# Patient Record
Sex: Male | Born: 1955 | Race: White | Hispanic: No | Smoking: Former smoker
Health system: Southern US, Community
[De-identification: ages and names within clinical notes are randomized; demographics above are authoritative.]

## PROBLEM LIST (undated history)

## (undated) DIAGNOSIS — F17210 Nicotine dependence, cigarettes, uncomplicated: Secondary | ICD-10-CM

## (undated) DIAGNOSIS — R0600 Dyspnea, unspecified: Secondary | ICD-10-CM

## (undated) DIAGNOSIS — E7800 Pure hypercholesterolemia, unspecified: Secondary | ICD-10-CM

## (undated) DIAGNOSIS — K219 Gastro-esophageal reflux disease without esophagitis: Secondary | ICD-10-CM

## (undated) DIAGNOSIS — G629 Polyneuropathy, unspecified: Secondary | ICD-10-CM

## (undated) DIAGNOSIS — J449 Chronic obstructive pulmonary disease, unspecified: Secondary | ICD-10-CM

## (undated) DIAGNOSIS — M1990 Unspecified osteoarthritis, unspecified site: Secondary | ICD-10-CM

## (undated) DIAGNOSIS — L723 Sebaceous cyst: Secondary | ICD-10-CM

## (undated) DIAGNOSIS — I10 Essential (primary) hypertension: Secondary | ICD-10-CM

## (undated) DIAGNOSIS — C801 Malignant (primary) neoplasm, unspecified: Secondary | ICD-10-CM

## (undated) DIAGNOSIS — B079 Viral wart, unspecified: Secondary | ICD-10-CM

## (undated) HISTORY — PX: BACK SURGERY: SHX140

## (undated) HISTORY — PX: OTHER SURGICAL HISTORY: SHX169

## (undated) HISTORY — PX: COLONOSCOPY: SHX174

## (undated) HISTORY — DX: Malignant (primary) neoplasm, unspecified: C80.1

## (undated) HISTORY — PX: KNEE ARTHROSCOPY: SUR90

## (undated) HISTORY — DX: Essential (primary) hypertension: I10

## (undated) HISTORY — PX: LEG SKIN LESION  BIOPSY / EXCISION: SUR473

## (undated) HISTORY — PX: POLYPECTOMY: SHX149

---

## 1978-09-29 HISTORY — PX: OTHER SURGICAL HISTORY: SHX169

## 1998-01-16 ENCOUNTER — Other Ambulatory Visit: Admission: RE | Admit: 1998-01-16 | Discharge: 1998-01-16 | Payer: Self-pay | Admitting: *Deleted

## 1998-07-12 ENCOUNTER — Ambulatory Visit (HOSPITAL_BASED_OUTPATIENT_CLINIC_OR_DEPARTMENT_OTHER): Admission: RE | Admit: 1998-07-12 | Discharge: 1998-07-12 | Payer: Self-pay | Admitting: *Deleted

## 2000-09-29 HISTORY — PX: OTHER SURGICAL HISTORY: SHX169

## 2001-08-10 ENCOUNTER — Ambulatory Visit (HOSPITAL_BASED_OUTPATIENT_CLINIC_OR_DEPARTMENT_OTHER): Admission: RE | Admit: 2001-08-10 | Discharge: 2001-08-11 | Payer: Self-pay | Admitting: *Deleted

## 2002-02-23 ENCOUNTER — Encounter: Payer: Self-pay | Admitting: Orthopedic Surgery

## 2002-02-23 ENCOUNTER — Encounter: Admission: RE | Admit: 2002-02-23 | Discharge: 2002-02-23 | Payer: Self-pay | Admitting: Orthopedic Surgery

## 2002-08-22 ENCOUNTER — Encounter: Payer: Self-pay | Admitting: Family Medicine

## 2002-08-22 ENCOUNTER — Encounter: Admission: RE | Admit: 2002-08-22 | Discharge: 2002-08-22 | Payer: Self-pay | Admitting: Family Medicine

## 2005-02-04 ENCOUNTER — Ambulatory Visit: Payer: Self-pay | Admitting: Gastroenterology

## 2005-02-14 ENCOUNTER — Encounter (INDEPENDENT_AMBULATORY_CARE_PROVIDER_SITE_OTHER): Payer: Self-pay | Admitting: Specialist

## 2005-02-14 ENCOUNTER — Ambulatory Visit: Payer: Self-pay | Admitting: Gastroenterology

## 2005-11-11 ENCOUNTER — Encounter: Admission: RE | Admit: 2005-11-11 | Discharge: 2005-11-11 | Payer: Self-pay | Admitting: Family Medicine

## 2005-12-17 ENCOUNTER — Encounter: Admission: RE | Admit: 2005-12-17 | Discharge: 2005-12-17 | Payer: Self-pay | Admitting: Family Medicine

## 2005-12-19 ENCOUNTER — Encounter: Admission: RE | Admit: 2005-12-19 | Discharge: 2005-12-19 | Payer: Self-pay | Admitting: Family Medicine

## 2005-12-26 ENCOUNTER — Encounter: Admission: RE | Admit: 2005-12-26 | Discharge: 2005-12-26 | Payer: Self-pay | Admitting: Family Medicine

## 2006-01-02 ENCOUNTER — Encounter: Admission: RE | Admit: 2006-01-02 | Discharge: 2006-01-02 | Payer: Self-pay | Admitting: Family Medicine

## 2006-01-16 ENCOUNTER — Encounter: Admission: RE | Admit: 2006-01-16 | Discharge: 2006-01-16 | Payer: Self-pay | Admitting: Family Medicine

## 2006-02-18 ENCOUNTER — Ambulatory Visit (HOSPITAL_COMMUNITY): Admission: RE | Admit: 2006-02-18 | Discharge: 2006-02-19 | Payer: Self-pay | Admitting: Specialist

## 2006-09-29 HISTORY — PX: OTHER SURGICAL HISTORY: SHX169

## 2007-02-01 ENCOUNTER — Ambulatory Visit: Payer: Self-pay | Admitting: Gastroenterology

## 2007-02-19 ENCOUNTER — Ambulatory Visit: Payer: Self-pay | Admitting: Gastroenterology

## 2007-02-19 ENCOUNTER — Encounter: Payer: Self-pay | Admitting: Gastroenterology

## 2007-05-14 ENCOUNTER — Encounter: Admission: RE | Admit: 2007-05-14 | Discharge: 2007-05-14 | Payer: Self-pay | Admitting: Otolaryngology

## 2007-07-30 ENCOUNTER — Ambulatory Visit: Payer: Self-pay | Admitting: Gastroenterology

## 2007-08-11 ENCOUNTER — Ambulatory Visit: Payer: Self-pay | Admitting: Gastroenterology

## 2007-08-11 ENCOUNTER — Encounter: Payer: Self-pay | Admitting: Gastroenterology

## 2008-03-24 ENCOUNTER — Encounter: Admission: RE | Admit: 2008-03-24 | Discharge: 2008-03-24 | Payer: Self-pay | Admitting: Family Medicine

## 2008-04-07 ENCOUNTER — Encounter: Admission: RE | Admit: 2008-04-07 | Discharge: 2008-04-07 | Payer: Self-pay | Admitting: Family Medicine

## 2008-07-12 ENCOUNTER — Encounter: Admission: RE | Admit: 2008-07-12 | Discharge: 2008-07-12 | Payer: Self-pay | Admitting: Family Medicine

## 2009-01-17 ENCOUNTER — Encounter: Admission: RE | Admit: 2009-01-17 | Discharge: 2009-01-17 | Payer: Self-pay | Admitting: Family Medicine

## 2009-04-10 ENCOUNTER — Ambulatory Visit (HOSPITAL_COMMUNITY): Admission: RE | Admit: 2009-04-10 | Discharge: 2009-04-10 | Payer: Self-pay | Admitting: Family Medicine

## 2009-04-11 ENCOUNTER — Encounter (HOSPITAL_COMMUNITY): Admission: RE | Admit: 2009-04-11 | Discharge: 2009-05-31 | Payer: Self-pay | Admitting: Family Medicine

## 2009-07-05 ENCOUNTER — Encounter (INDEPENDENT_AMBULATORY_CARE_PROVIDER_SITE_OTHER): Payer: Self-pay | Admitting: *Deleted

## 2010-03-12 ENCOUNTER — Telehealth: Payer: Self-pay | Admitting: Gastroenterology

## 2010-03-26 ENCOUNTER — Encounter: Admission: RE | Admit: 2010-03-26 | Discharge: 2010-03-26 | Payer: Self-pay | Admitting: Family Medicine

## 2010-05-10 ENCOUNTER — Encounter (INDEPENDENT_AMBULATORY_CARE_PROVIDER_SITE_OTHER): Payer: Self-pay | Admitting: *Deleted

## 2010-05-14 ENCOUNTER — Ambulatory Visit: Payer: Self-pay | Admitting: Gastroenterology

## 2010-05-31 ENCOUNTER — Ambulatory Visit: Payer: Self-pay | Admitting: Gastroenterology

## 2010-06-05 ENCOUNTER — Encounter: Payer: Self-pay | Admitting: Gastroenterology

## 2010-09-29 HISTORY — PX: TOTAL HIP ARTHROPLASTY: SHX124

## 2010-10-29 NOTE — Letter (Signed)
Summary: [facility] Instructions  [facility]  [address] [place], [place] [address]   Phone: [phone]  Fax: [phone]       Mark Stevenson    06/18/1956    MRN: [id number]        Procedure Day Dorna Bloom:  [name]  05/31/10     Arrival Time:  8:00AM     Procedure Time:  9:00AM     Location of Procedure:                    _ X_  [facility] (4th Floor)                       PREPARATION FOR COLONOSCOPY WITH MOVIPREP   Starting 5 days prior to your procedure 05/26/10 do not eat nuts, seeds, popcorn, corn, beans, peas,  salads, or any raw vegetables.  Do not take any fiber supplements (e.g. Metamucil, Citrucel, and Benefiber).  THE DAY BEFORE YOUR PROCEDURE         DATE: 05/30/10  DAY: THURSDAY  1.  Drink clear liquids the entire day-NO SOLID FOOD  2.  Do not drink anything colored red or purple.  Avoid juices with pulp.  No orange juice.  3.  Drink at least 64 oz. (8 glasses) of fluid/clear liquids during the day to prevent dehydration and help the prep work efficiently.  CLEAR LIQUIDS INCLUDE: Water Jello Ice Popsicles Tea (sugar ok, no milk/cream) Powdered fruit flavored drinks Coffee (sugar ok, no milk/cream) Gatorade Juice: apple, white grape, white cranberry  Lemonade Clear bullion, consomm, broth Carbonated beverages (any kind) Strained chicken noodle soup Hard Candy                             4.  In the morning, mix first dose of MoviPrep solution:    Empty 1 Pouch A and 1 Pouch B into the disposable container    Add lukewarm drinking water to the top line of the container. Mix to dissolve    Refrigerate (mixed solution should be used within 24 hrs)  5.  Begin drinking the prep at 5:00 p.m. The MoviPrep container is divided by 4 marks.   Every 15 minutes drink the solution down to the next mark (approximately 8 oz) until the full liter is complete.   6.  Follow completed prep with 16 oz of clear liquid of your choice (Nothing  red or purple).  Continue to drink clear liquids until bedtime.  7.  Before going to bed, mix second dose of MoviPrep solution:    Empty 1 Pouch A and 1 Pouch B into the disposable container    Add lukewarm drinking water to the top line of the container. Mix to dissolve    Refrigerate  THE DAY OF YOUR PROCEDURE      DATE: 05/31/10  DAY: FRIDAY  Beginning at 4:00AM (5 hours before procedure):         1. Every 15 minutes, drink the solution down to the next mark (approx 8 oz) until the full liter is complete.  2. Follow completed prep with 16 oz. of clear liquid of your choice.    3. You may drink clear liquids until 7:00AM (2 HOURS BEFORE PROCEDURE).   MEDICATION INSTRUCTIONS  Unless otherwise instructed, you should take regular prescription medications with a small sip of water   as early as possible the morning of  your procedure.  Additional medication instructions: HOLD Triamtere/ HCTZ before coming in the day of the procedure only (only hold 05-31-10)         OTHER INSTRUCTIONS  You will need a responsible adult at least 56 years of age to accompany you and drive you home.   This person must remain in the waiting room during your procedure.  Wear loose fitting clothing that is easily removed.  Leave jewelry and other valuables at home.  However, you may wish to bring a book to read or  an iPod/MP3 player to listen to music as you wait for your procedure to start.  Remove all body piercing jewelry and leave at home.  Total time from sign-in until discharge is approximately 2-3 hours.  You should go home directly after your procedure and rest.  You can resume normal activities the  day after your procedure.  The day of your procedure you should not:   Drive   Make legal decisions   Operate machinery   Drink alcohol   Return to work  You will receive specific instructions about eating, activities and medications before you leave.    The above instructions  have been reviewed and explained to me by  [name] RN  May 14, 2010 8:13 AM    I fully understand and can verbalize these instructions _____________________________ Date _________

## 2010-10-29 NOTE — Procedures (Signed)
Summary: Colonoscopy  Patient: [name] Note: All result statuses are Final unless otherwise noted.  Tests: (1) Colonoscopy (COL)   COL Colonoscopy           DONE     [facility]     [address]     [place], [place]  [address]           COLONOSCOPY PROCEDURE REPORT           PATIENT:  Mark Stevenson, Mark Stevenson  MR#:  [id number]     BIRTHDATE:  January 24, 1956, 54 yrs. old  GENDER:  male     ENDOSCOPIST:  Judie Petit T. Russella Dar, MD, [facility]           PROCEDURE DATE:  05/31/2010     PROCEDURE:  Colonoscopy with snare polypectomy     ASA CLASS:  Class II     INDICATIONS:  1) surveillance and high-risk screening  2)     follow-up of polyp, adenomatous polyps, 01/2005, 07/2007.     MEDICATIONS:   Fentanyl 75 mcg IV, Versed 8 mg IV     DESCRIPTION OF PROCEDURE:   After the risks benefits and     alternatives of the procedure were thoroughly explained, informed     consent was obtained.  Digital rectal exam was performed and     revealed no abnormalities.   The LB PCF-H180AL [id number] endoscope     was introduced through the anus and advanced to the cecum, which     was identified by both the appendix and ileocecal valve, without     limitations.  The quality of the prep was good, using MoviPrep.     The instrument was then slowly withdrawn as the colon was fully     examined.     <<PROCEDUREIMAGES>>     FINDINGS:  A sessile polyp was found in the ascending colon. It     was 7 mm in size. Polyp was snared, then cauterized with monopolar     cautery. Retrieval was successful. A sessile polyp was found in     the descending colon. It was 4 mm in size. Polyp was snared     without cautery. Retrieval was successful. A pedunculated polyp     was found in the sigmoid colon. It was 12 mm in size. Polyp was     snared, then cauterized with monopolar cautery. Retrieval was     successful. A sessile polyps were found in the rectum. It was 5 mm     in size. Polyp was snared, then cauterized with  monopolar cautery.     Retrieval was successful. Mild diverticulosis was found in the     sigmoid colon. This was otherwise a normal examination of the     colon. Retroflexed views in the rectum revealed internal     hemorrhoids, moderate.  The time to cecum =  2.5  minutes. The     scope was then withdrawn (time =  12.25  min) from the patient and     the procedure completed.           COMPLICATIONS:  None           ENDOSCOPIC IMPRESSION:     1) 7 mm sessile polyp in the ascending colon     2) 4 mm sessile polyp in the descending colon     3) 12 mm pedunculated polyp in the sigmoid colon     4) 5 mm sessile polyp in  the rectum     5) Mild diverticulosis in the sigmoid colon     6) Internal hemorrhoids           RECOMMENDATIONS:     1) No aspirin or NSAID's for 2 weeks     2) Await pathology results     3) Repeat Colonoscopy in 3 years pending pathology review           Malcolm T. Russella Dar, MD, [facility]           CC: Melba Coon MD           n.     Rosalie DoctorVenita Lick. Stark at 05/31/2010 09:38 AM           Albin Fischer, [id number]  Note: An exclamation mark (!) indicates a result that was not dispersed into the flowsheet. Document Creation Date: 05/31/2010 9:38 AM _______________________________________________________________________  (1) Order result status: Final Collection or observation date-time: 05/31/2010 09:26 Requested date-time:  Receipt date-time:  Reported date-time:  Referring Physician:   Ordering Physician: Claudette Head [phone]) Specimen Source:  Source: Launa Grill Order Number: [id number] Lab site:   Appended Document: Colonoscopy     Procedures Next Due Date:    Colonoscopy: 05/2013

## 2010-10-29 NOTE — Progress Notes (Signed)
Summary: Schedule Colonoscopy   Phone Note Outgoing Call Call back at Home Phone [phone]   Call placed by: Harlow Mares CMA Duncan Dull),  March 12, 2010 10:15 AM Call placed to: Patient Summary of Call: Left message on patients machine to call back. patient is due for colonoscopy. Initial call taken by: Harlow Mares CMA Duncan Dull),  March 12, 2010 10:16 AM  Follow-up for Phone Call        Left a message on the patient machine to call back and schedule a previsit and procedure with our office. A letter will be mailed to the patient.   Follow-up by: Harlow Mares CMA Duncan Dull),  March 28, 2010 11:38 AM

## 2010-10-29 NOTE — Miscellaneous (Signed)
Summary: [facility] previsit  Clinical Lists Changes  Medications: Added new medication of MOVIPREP 100 GM  SOLR (PEG-KCL-NACL-NASULF-NA ASC-C) As per prep instructions. - Signed Rx of MOVIPREP 100 GM  SOLR (PEG-KCL-NACL-NASULF-NA ASC-C) As per prep instructions.;  #1 x 0;  Signed;  Entered by: [name] RN;  Authorized by: Meryl Dare MD [facility];  Method used: Electronically to [facility]  [address] [phone]*, [address] [place], [place]  [address], Ph: [phone] or [phone], Fax: [phone] Observations: Added new observation of NKA: T (05/14/2010 7:53)    Prescriptions: MOVIPREP 100 GM  SOLR (PEG-KCL-NACL-NASULF-NA ASC-C) As per prep instructions.  #1 x 0   Entered by:   [name] RN   Authorized by:   Meryl Dare MD [facility]   Signed by:   [name] RN on 05/14/2010   Method used:   Electronically to        [facility]  [address] [phone] (retail)       [address]       [place], [place]  [address]       Ph: [phone] or [phone]       Fax: [phone]   RxID:   [id number]

## 2010-10-29 NOTE — Letter (Signed)
Summary: Patient Notice-Hyperplastic Polyps  [facility]  [address] [place], [place] [address]   Phone: [phone]  Fax: [phone]        June 05, 2010 MRN: [id number]    [facility] [address] [place], [place]  [address]    Dear Mr. Gunnerson,  I am pleased to inform you that the colon polyp(s) removed during your recent colonoscopy was (were) found to be hyperplastic. These types of polyps are NOT pre-cancerous.  It is my recommendation that you have a repeat colonoscopy examination in 3 years.  Should you develop new or worsening symptoms of abdominal pain, bowel habit changes or bleeding from the rectum or bowels, please schedule an evaluation with either your primary care physician or with me.  Continue treatment plan as outlined the day of your exam.  Please call us if you are having persistent problems or have questions about your condition that have not been fully answered at this time.  Sincerely,  Meryl Dare MD [facility]  This letter has been electronically signed by your physician.  Appended Document: Patient Notice-Hyperplastic Polyps letter mailed

## 2011-02-14 NOTE — Op Note (Signed)
[facility]  Patient:    Mark Stevenson, Mark Stevenson Visit Number: [id number] MRN: [id number]          Service Type: DSU Location: [facility] Attending Physician:  Maryanna Shape Dictated by:   Reynolds Bowl, M.D. Proc. Date: 08/10/01 Admit Date:  08/10/2001                             Operative Report  PREOPERATIVE DIAGNOSIS:  Comminuted vertical split fracture, right patella.  POSTOPERATIVE DIAGNOSIS:  Comminuted vertical split fracture, right patella.  OPERATION PERFORMED:  Open reduction, internal fixation, followed by an arthroscopy and removal of loose chondral bodies.  SURGEON:  Reynolds Bowl, M.D.  ANESTHESIA:  General.  DESCRIPTION OF PROCEDURE:  The patient was given a general anesthetic, 1 g of Ancef IV. The proximal pneumatic tourniquet was put in place and isolated with the U-drape.  He was then draped in the usual manner and this included the use of an [place].  A vertical incision was made and carried down to the prepatella bursal level, and the overlying soft tissues pushed aside subperiosteally, sufficiently to expose the fracture area.  Clots and debris were removed.  There were multiple very small particles of chondral fragments with bits of cancellus bone attached, and others with none attached.  These were removed.  At that point I was able to, with the suction and with pickups, remove several other loose chondral bodies that were immediately under the fracture area.  At this point then I could reduce the fracture anatomically anteriorly, and elected to use two transverse 3.5 lag screws.  I used a guide wire and then drilled, and then tapped, then the lag screw.  The most distal screw held the fracture exactly reduced and looked very good.  The more proximal screw was a little superficial and was exposed by 4.5 threads, but it held very well and provided compression, and I elected not to change it.  But I did want these screws  to be on the more superficial or tension side.  I then was able to flex and extend the knee fully, and the fracture looked good.  There was smooth motion.  At this point, because I had seen so many small chondral fragments, I elected to do an arthroscopy, and with arthroscopy there were several other chondral fragments removed with the rotary meniscotome.  I could see that the femoral trochlea was abraded, and that the undersurface of the patella had gross defects that were not all vertical.  There were some horizontal ones, and that perhaps is where some of the other loose bodies came from.  After looking around the joint and removing all the apparent loose bodies, the instruments were removed.  I then closed the knee in layers using #2-0 and #3-0 Vicryl.  Having done this, the knee would flex and extend well and hold. Then the skin edges held in apposition with the metal staples.  A bulky dressing was applied, followed by an ACE wrap, followed by a knee immobilizer.  At this point the patient will be kept in the hospital overnight for pain control.  Do frequent quad sets and ankle pumps.  He is to be ambulatory, toe touching only, mostly at house rest.  Tylox or Vicodin for pain.  He will see me in the office in one week.  I would hope to begin some gentle motion in approximately one week.  Dictated by:   Reynolds Bowl, M.D. Attending Physician:  Maryanna Shape DD:  08/10/01 TD:  08/10/01 Job: [id number]

## 2011-02-14 NOTE — Op Note (Signed)
NAMEQUINLAN, VOLLMER             ACCOUNT NO.:  [id number]   MEDICAL RECORD NO.:  [id number]          PATIENT TYPE:  AMB   LOCATION:  DAY                          FACILITY:  [facility]   PHYSICIAN:  Jene Every, M.D.    DATE OF BIRTH:  [birth year]   DATE OF PROCEDURE:  02/18/2006  DATE OF DISCHARGE:                                 OPERATIVE REPORT   PREOPERATIVE DIAGNOSIS:  Spinal stenosis, herniated nucleus pulposus at L3-  4.   POSTOPERATIVE DIAGNOSIS:  Spinal stenosis, herniated nucleus pulposus at L3-  4.   PROCEDURE PERFORMED:  Central decompression at L3-4 with microdiskectomy, L3-  4, left, foraminotomy of L4, lateral recess decompression.   ANESTHESIA:  General.   SURGEON:  Jene Every, M.D.   ASSISTANT:  Georges Lynch. Gioffre, M.D.   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  Operative intervention was indicated for decompression, particularly  on the left at L3-4.  We discussed the possibility of bilateral  hemilaminotomies versus central decompression following the intraoperative  findings.  The risks and benefits have been discussed, including bleeding,  infection, damage to vascular structures, CSF leakage, epidural fibrosis,  adjacent segment disease, need for fusion in the future, anesthetic  complications, etc.   TECHNIQUE:  With the patient in the supine position after the induction of  adequate general anesthesia and 2 gm of Kefzol, he was placed prone on the  [place] frame.  All bony prominences were well padded.  The lumbar region  prepped and draped in the usual sterile fashion using the 9090 position.  Two 18 gauge spinal needles were utilized to localize the 3-4 interspace,  confirmed with x-rays.   An incision was made through the spinous process  from 3-4.  Subcutaneous tissue was dissected.  Electrocautery was utilized  to achieve hemostasis.  The dorsal lumbar fascia was identified and divided  along the line of the skin incision.  The paraspinous muscle was elevated  from the lamina of 3-4.  A McCullough retractor was placed, a Penfield 4 in  the interlaminar space.  This was confirmed with x-ray at 3-4.  There was a  very small interlaminar space at 3-4 on the left ligamentum flavum.  Due to  the facet hypertrophy, it was felt that central decompression would be safer  in terms of the decompression, noted severe spinal stenosis centrally;  therefore, Leksell rongeur was utilized to perform a partial removal of the  spinous process of 3-4 in the interspinous ligament.  Continuing laminotomy  of the caudad edge of 3 was performed with a 2 mm Kerrison, extending it  cephalad to detach the ligamentum flavum from its cephalad attachment.  In a  similar fashion, the ligamentum flavum was attached from the cephalad edge  of 4 utilizing a 2 mm Kerrison, performing foraminotomy of 4.  It is  severely tight in the lateral recess of 3-4 on the left.  Under my operating  microscope, we gently mobilized the thecal sac medially after the central  ligamentum flavum removal, and completed the foraminotomy and the lateral  recess decompression at the medial border of the pedicle at 3-4 on the left.  Epidural venous plexus was noted, and bipolar electrocautery was utilized to  achieve hemostasis.  Central disk protrusion was noted.  Annulotomy was  performed.  A copious portion of disk material was removed from the disk  space with a straight and up-biting micropituitary.  From that, mobilzing  with an Epstein, multiple fragments were removed.  A thorough diskectomy of  herniated material was performed.  The disk space was copiously irrigated  with antibiotic irrigation.  A hockey stick probe was  placed in the foramen  of 3-4, found to be widely patent beneath the thecal sac and also onto the  right side as well, passed easily into the 3-4 foramen.  No disk herniation  noted on the right.  The wound copiously irrigated.  Inspection revealed no  evidence of CSF leaks or active bleeding.  We placed bone wax on the  cancellous surfaces, and thrombin-soaked Gelfoam in the interlaminar space.  Removed the [facility] retractor.  Paraspinous muscles inspected.  Placed  bone wax on the spinous processes.  Irrigated.  Placed a Hemovac and brought  it out through a lateral stab wound in the skin.  Repaired the dorsal lumbar  fascia with #1 Vicryl interrupted figure-of-eight sutures.  Subcutaneous  tissue was reapproximated with 2-0 Vicryl simple sutures.  The skin was  reapproximated with staples.  The wound was dressed sterilely.  Placed  supine on the hospital bed.  Extubated without difficulty and transported to  the recovery room in satisfactory condition.   Patient tolerated the procedure well.  No complications.  Blood loss 100 cc.      Jene Every, M.D.  Electronically Signed     JB/MEDQ  D:  02/18/2006  T:  02/18/2006  Job:  [id number]

## 2011-02-18 ENCOUNTER — Encounter (HOSPITAL_BASED_OUTPATIENT_CLINIC_OR_DEPARTMENT_OTHER)
Admission: RE | Admit: 2011-02-18 | Discharge: 2011-02-18 | Disposition: A | Discharge status: Home / Self Care | Payer: 59 | Source: Ambulatory Visit | Attending: Orthopedic Surgery | Admitting: Orthopedic Surgery

## 2011-02-18 LAB — BASIC METABOLIC PANEL
GFR calc Af Amer: >60 mL/min (ref >60)
GFR calc non Af Amer: >60 mL/min (ref >60)
Sodium: 137 mEq/L (ref 135–145)

## 2011-02-19 ENCOUNTER — Ambulatory Visit (HOSPITAL_BASED_OUTPATIENT_CLINIC_OR_DEPARTMENT_OTHER)
Admission: RE | Admit: 2011-02-19 | Discharge: 2011-02-19 | Disposition: A | Discharge status: Home / Self Care | Payer: 59 | Source: Ambulatory Visit | Attending: Orthopedic Surgery | Admitting: Orthopedic Surgery

## 2011-02-19 DIAGNOSIS — Z01812 Encounter for preprocedural laboratory examination: Secondary | ICD-10-CM | POA: Insufficient documentation

## 2011-02-19 DIAGNOSIS — E669 Obesity, unspecified: Secondary | ICD-10-CM | POA: Insufficient documentation

## 2011-02-19 DIAGNOSIS — M6240 Contracture of muscle, unspecified site: Secondary | ICD-10-CM | POA: Insufficient documentation

## 2011-02-19 DIAGNOSIS — Z01810 Encounter for preprocedural cardiovascular examination: Secondary | ICD-10-CM | POA: Insufficient documentation

## 2011-02-19 DIAGNOSIS — J449 Chronic obstructive pulmonary disease, unspecified: Secondary | ICD-10-CM | POA: Insufficient documentation

## 2011-02-19 DIAGNOSIS — F17200 Nicotine dependence, unspecified, uncomplicated: Secondary | ICD-10-CM | POA: Insufficient documentation

## 2011-02-19 DIAGNOSIS — G5750 Tarsal tunnel syndrome, unspecified lower limb: Secondary | ICD-10-CM | POA: Insufficient documentation

## 2011-02-19 DIAGNOSIS — I10 Essential (primary) hypertension: Secondary | ICD-10-CM | POA: Insufficient documentation

## 2011-02-19 DIAGNOSIS — J4489 Other specified chronic obstructive pulmonary disease: Secondary | ICD-10-CM | POA: Insufficient documentation

## 2011-02-19 LAB — POCT HEMOGLOBIN-HEMACUE: Hemoglobin: 16.9 g/dL (ref 13.0–17.0)

## 2011-03-01 NOTE — Op Note (Signed)
Mark Stevenson, Mark Stevenson             ACCOUNT NO.:  [id number]  MEDICAL RECORD NO.:  [id number]          PATIENT TYPE:  LOCATION:                                 FACILITY:  PHYSICIAN:  Leonides Grills, M.D.          DATE OF BIRTH:  DATE OF PROCEDURE:  02/19/2011 DATE OF DISCHARGE:                              OPERATIVE REPORT   PREOPERATIVE DIAGNOSES: 1. Right tarsal tunnel syndrome. 2. Right tight gastroc.  POSTOPERATIVE DIAGNOSES: 1. Right tarsal tunnel syndrome. 2. Right tight gastroc.  OPERATIONS: 1. Right tarsal tunnel release. 2. Right gastroc slide.  ANESTHESIA:  General.  SURGEON:  Leonides Grills, MD  ASSISTANT:  Richardean Canal, PA-C  ESTIMATED BLOOD LOSS:  Minimal.  TOURNIQUET TIME:  Approximately 20 minutes.  COMPLICATIONS:  None.  DISPOSITION:  Stable to PR.  INDICATIONS:  This is a 55 year old gentleman who has had longstanding plantar fasciitis as well as burning plantar foot pain that was shown on EMG nerve conduction study that would be tarsal tunnel syndrome.  He was consented for the above procedure.  All risks of infection, neurovascular injury, persistent pain, worsening pain, prolonged recovery, wound healing problems, DVT, PE were all explained, questions were encouraged and answered.  OPERATIVE NOTE:  The patient was brought to the operating room and placed in supine position after adequate general anesthesia was administered as well as Ancef 1 gram IV piggyback.  Right lower extremity was prepped in sterile manner over proximally thigh tourniquet.  We started the procedure with a longitudinal incision on the medial aspect of the gastrocnemius muscle tendinous junction. Dissection was carried down through the skin.  Hemostasis was obtained. Fascia was opened in line with the incision.  Conjoined region was then developed between the gastroc and soleus.  Soft tissue was then elevated off the posterior aspect of the gastrocnemius.  Sural nerve  was identified and protected posteriorly throughout the case.  Gastrocnemius was then released with a curved Mayo scissors protecting soft tissues posteriorly including the sural nerve at all times.  This had outstanding release of tight gastroc.  The area was copiously irrigated with normal saline.  Subcu was closed with 3-0 Vicryl.  Skin was closed with 4-0 Monocryl subcuticular stitch.  Steri-Strips were applied.  The limb was then gravity exsanguinated and tourniquet was elevated to 290 mmHg.  A curvilinear incision midline over the posterior medial aspect of the right ankle.  Tarsal tunnel area was then made.  Dissection was carried down through the skin.  Hemostasis was obtained.  The flexor retinaculum over the tarsal tunnel was then carefully dissected out and then with a Therapist, nutritional protecting that the neurovascular structures deep to this, the flexor retinaculum was then released.  We also released the lateral portion of the abductor halluces fascia as well. This had an outstanding release.  The area was copiously irrigated with normal saline.  Tourniquet was deflated.  Hemostasis was obtained. There was no pulsatile bleeding.  Subcu was closed with 3-0 Vicryl, skin was closed with 4-0 nylon.  Sterile dressing was applied.  Cam Walker boot was applied.  The patient was stable  to PR.     Leonides Grills, M.D.     PB/MEDQ  D:  02/19/2011  T:  02/20/2011  Job:  [id number]  Electronically Signed by Leonides Grills M.D. on 03/01/2011 09:52:35 AM

## 2011-03-17 ENCOUNTER — Encounter (HOSPITAL_BASED_OUTPATIENT_CLINIC_OR_DEPARTMENT_OTHER)
Admission: RE | Admit: 2011-03-17 | Discharge: 2011-03-17 | Disposition: A | Discharge status: Home / Self Care | Payer: 59 | Source: Ambulatory Visit | Attending: Orthopedic Surgery | Admitting: Orthopedic Surgery

## 2011-03-17 LAB — BASIC METABOLIC PANEL
BUN: 18 mg/dL (ref 6–23)
CO2: 22 mEq/L (ref 19–32)
Chloride: 103 mEq/L (ref 96–112)
Creatinine, Ser: 0.82 mg/dL (ref 0.50–1.35)
GFR calc Af Amer: >60 mL/min (ref >60)
Glucose, Bld: 85 mg/dL (ref 70–99)
Sodium: 136 mEq/L (ref 135–145)

## 2011-03-19 ENCOUNTER — Ambulatory Visit (HOSPITAL_BASED_OUTPATIENT_CLINIC_OR_DEPARTMENT_OTHER)
Admission: RE | Admit: 2011-03-19 | Discharge: 2011-03-19 | Disposition: A | Discharge status: Home / Self Care | Payer: 59 | Source: Ambulatory Visit | Attending: Orthopedic Surgery | Admitting: Orthopedic Surgery

## 2011-03-19 DIAGNOSIS — M6240 Contracture of muscle, unspecified site: Secondary | ICD-10-CM | POA: Insufficient documentation

## 2011-03-19 DIAGNOSIS — Z01812 Encounter for preprocedural laboratory examination: Secondary | ICD-10-CM | POA: Insufficient documentation

## 2011-03-19 DIAGNOSIS — G5750 Tarsal tunnel syndrome, unspecified lower limb: Secondary | ICD-10-CM | POA: Insufficient documentation

## 2011-04-01 ENCOUNTER — Other Ambulatory Visit: Payer: Self-pay | Admitting: Orthopedic Surgery

## 2011-04-01 ENCOUNTER — Encounter (HOSPITAL_COMMUNITY): Payer: 59

## 2011-04-01 ENCOUNTER — Ambulatory Visit (HOSPITAL_COMMUNITY)
Admission: RE | Admit: 2011-04-01 | Discharge: 2011-04-01 | Disposition: A | Discharge status: Home / Self Care | Payer: 59 | Source: Ambulatory Visit | Attending: Orthopedic Surgery | Admitting: Orthopedic Surgery

## 2011-04-01 ENCOUNTER — Other Ambulatory Visit (HOSPITAL_COMMUNITY): Payer: Self-pay | Admitting: Orthopedic Surgery

## 2011-04-01 DIAGNOSIS — Z01812 Encounter for preprocedural laboratory examination: Secondary | ICD-10-CM | POA: Insufficient documentation

## 2011-04-01 DIAGNOSIS — M87059 Idiopathic aseptic necrosis of unspecified femur: Secondary | ICD-10-CM

## 2011-04-01 DIAGNOSIS — Z01818 Encounter for other preprocedural examination: Secondary | ICD-10-CM | POA: Insufficient documentation

## 2011-04-01 DIAGNOSIS — R059 Cough, unspecified: Secondary | ICD-10-CM | POA: Insufficient documentation

## 2011-04-01 DIAGNOSIS — R05 Cough: Secondary | ICD-10-CM | POA: Insufficient documentation

## 2011-04-01 DIAGNOSIS — M1710 Unilateral primary osteoarthritis, unspecified knee: Secondary | ICD-10-CM | POA: Insufficient documentation

## 2011-04-01 DIAGNOSIS — Z01811 Encounter for preprocedural respiratory examination: Secondary | ICD-10-CM | POA: Insufficient documentation

## 2011-04-01 DIAGNOSIS — I10 Essential (primary) hypertension: Secondary | ICD-10-CM | POA: Insufficient documentation

## 2011-04-01 LAB — BASIC METABOLIC PANEL
BUN: 22 mg/dL (ref 6–23)
CO2: 24 mEq/L (ref 19–32)
Calcium: 9.6 mg/dL (ref 8.4–10.5)
Chloride: 103 mEq/L (ref 96–112)
Creatinine, Ser: 0.97 mg/dL (ref 0.50–1.35)
Glucose, Bld: 106 mg/dL — ABNORMAL HIGH (ref 70–99)

## 2011-04-01 LAB — CBC
HCT: 41.6 % (ref 39.0–52.0)
MCH: 32.4 pg (ref 26.0–34.0)
MCHC: 35.3 g/dL (ref 30.0–36.0)
RDW: 12.6 % (ref 11.5–15.5)

## 2011-04-01 LAB — DIFFERENTIAL
Basophils Absolute: 0.1 10*3/uL (ref 0.0–0.1)
Basophils Relative: 0 % (ref 0–1)
Eosinophils Absolute: 0.3 10*3/uL (ref 0.0–0.7)
Monocytes Absolute: 1.2 10*3/uL — ABNORMAL HIGH (ref 0.1–1.0)
Neutro Abs: 7.7 10*3/uL (ref 1.7–7.7)

## 2011-04-01 LAB — URINE MICROSCOPIC-ADD ON

## 2011-04-01 LAB — APTT: aPTT: 30 seconds (ref 24–37)

## 2011-04-01 LAB — URINALYSIS, ROUTINE W REFLEX MICROSCOPIC
Bilirubin Urine: NEGATIVE
Glucose, UA: NEGATIVE mg/dL
Ketones, ur: NEGATIVE mg/dL
Protein, ur: NEGATIVE mg/dL
pH: 6 (ref 5.0–8.0)

## 2011-04-01 LAB — SURGICAL PCR SCREEN
MRSA, PCR: NEGATIVE
Staphylococcus aureus: NEGATIVE

## 2011-04-07 NOTE — H&P (Signed)
NAMERADLEY, BARTO NO.:  [id number]  MEDICAL RECORD NO.:  [id number]  LOCATION:                                 FACILITY:  PHYSICIAN:  Madlyn Frankel. Charlann Boxer, M.D.  DATE OF BIRTH:  1956-04-11  DATE OF ADMISSION: DATE OF DISCHARGE:                             HISTORY & PHYSICAL   DATE OF SURGERY:  April 08, 2011  ADMISSION DIAGNOSIS:  Avascular necrosis, left hip.  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  After discussion of treatments, benefits, risks, and options, the patient is now scheduled for anterior total hip arthroplasty of the left hip.  Note that he has had bilateral tarsal tunnel releases recently last one being the left one which was done on March 19, 2011.  We will just need to check that incision and pad carefully when we do the hip.  Note that he is a candidate for tranexamic and will receive that at surgery and is given his home prescriptions of aspirin, Robaxin, iron, MiraLax, and Colace in the office.  PAST MEDICAL HISTORY:  Drug allergies:  None.  Current medications: 1. Losartan 100 mg 1 daily. 2. Triamterene and hydrochlorothiazide 37.5/25 one daily. 3. Lovastatin 20 mg 1 daily. 4. Gabapentin 600 mg 1 nightly.  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  Previous surgeries include left and right knee arthroscopies, ORIF of fractured kneecap on the right, low back surgery for herniated disk, and bilateral tarsal tunnels.  FAMILY HISTORY:  Positive for cancer in his father.  SOCIAL HISTORY:  The patient is married.  He works at [employer].  He smokes 1-1/2 packs a day and drinks rarely.  REVIEW OF SYSTEMS:  CENTRAL NERVOUS SYSTEM:  Negative for headache, blurred vision, or dizziness.  PULMONARY:  Positive  for exertional shortness of breath, chronic bronchitis, and COPD.  Negative for sleep apnea.  CARDIOVASCULAR:  Negative for chest pain or palpitation.  GI: Negative for ulcers or hepatitis.  GU:  Negative for urinary tract difficulty.  MUSCULOSKELETAL:  Positive as in HPI.  PHYSICAL EXAMINATION:  VITAL SIGNS:  BP 120/78, respirations 16, and pulse 70 and regular. GENERAL APPEARANCE:  This is a well-developed, well-nourished gentleman in no acute distress. HEENT:  Head normocephalic.  Nose patent.  Ears parent.  Pupils are equal, round, and reactive to light.  Throat without injection. NECK:  Supple without adenopathy.  Carotids are 2+ without bruit. CHEST:  Clear to auscultation.  No rales or rhonchi.  Respirations 16. HEART:  Regular rate and rhythm at 78 beats per minute without murmur. ABDOMEN:  Soft.  Active bowel sounds.  No masses or organomegaly. NEUROLOGIC:  The patient is alert and oriented to time, place, and person.  Cranial nerves II-XII grossly intact. EXTREMITIES:  The left ankle in a Cam walker, status post tarsal tunnel release.  Neurovascular status is intact.  His left hip shows decreased range of motion with pain on rotation.  Sensation and circulation are intact.  IMPRESSION:  Left hip avascular necrosis.  PLAN:  Left total hip arthroplasty, anterior approach.     Jaquelyn Bitter. Chabon, P.A.   ______________________________ Madlyn Frankel Charlann Boxer, M.D.    SJC/MEDQ  D:  03/26/2011  T:  03/26/2011  Job:  [id number]  Electronically Signed by Jodene Nam P.A. on 04/06/2011 04:52:11 PM Electronically Signed by Durene Romans M.D. on 04/07/2011 09:15:45 AM

## 2011-04-08 ENCOUNTER — Inpatient Hospital Stay (HOSPITAL_COMMUNITY)
Admission: RE | Admit: 2011-04-08 | Discharge: 2011-04-10 | DRG: 470 | Disposition: A | Discharge status: Home Health | Payer: 59 | Source: Ambulatory Visit | Attending: Orthopedic Surgery | Admitting: Orthopedic Surgery

## 2011-04-08 ENCOUNTER — Inpatient Hospital Stay (HOSPITAL_COMMUNITY): Payer: 59

## 2011-04-08 DIAGNOSIS — M87059 Idiopathic aseptic necrosis of unspecified femur: Principal | ICD-10-CM | POA: Diagnosis present

## 2011-04-08 DIAGNOSIS — I10 Essential (primary) hypertension: Secondary | ICD-10-CM | POA: Diagnosis present

## 2011-04-08 LAB — TYPE AND SCREEN: Antibody Screen: NEGATIVE

## 2011-04-09 LAB — BASIC METABOLIC PANEL
BUN: 13 mg/dL (ref 6–23)
Calcium: 8.3 mg/dL — ABNORMAL LOW (ref 8.4–10.5)
GFR calc Af Amer: >60 mL/min (ref >60)
GFR calc non Af Amer: >60 mL/min (ref >60)
Potassium: 3.5 mEq/L (ref 3.5–5.1)
Sodium: 137 mEq/L (ref 135–145)

## 2011-04-09 LAB — CBC
MCH: 32.2 pg (ref 26.0–34.0)
MCHC: 34.5 g/dL (ref 30.0–36.0)
RDW: 12.7 % (ref 11.5–15.5)

## 2011-04-10 LAB — CBC
Hemoglobin: 11.3 g/dL — ABNORMAL LOW (ref 13.0–17.0)
MCH: 31.7 pg (ref 26.0–34.0)
MCHC: 33.8 g/dL (ref 30.0–36.0)
Platelets: 152 10*3/uL (ref 150–400)
RBC: 3.56 MIL/uL — ABNORMAL LOW (ref 4.22–5.81)

## 2011-04-10 LAB — BASIC METABOLIC PANEL
Calcium: 8.8 mg/dL (ref 8.4–10.5)
GFR calc Af Amer: >60 mL/min (ref >60)
GFR calc non Af Amer: >60 mL/min (ref >60)
Glucose, Bld: 103 mg/dL — ABNORMAL HIGH (ref 70–99)
Potassium: 3.2 mEq/L — ABNORMAL LOW (ref 3.5–5.1)
Sodium: 135 mEq/L (ref 135–145)

## 2011-04-14 NOTE — Op Note (Signed)
NAMEKERT, SHACKETT NO.:  [id number]  MEDICAL RECORD NO.:  [id number]  LOCATION:  0006                         FACILITY:  [facility]  PHYSICIAN:  Madlyn Frankel. Charlann Boxer, M.D.  DATE OF BIRTH:  Feb 23, 1956  DATE OF PROCEDURE:  04/08/2011 DATE OF DISCHARGE:                              OPERATIVE REPORT   PREOPERATIVE DIAGNOSIS:  Left hip avascular necrosis.  POSTOPERATIVE DIAGNOSIS:  Left hip avascular necrosis.  PROCEDURE:  Left total hip replacement utilizing DePuy component, size 52 Pinnacle shell, 36 +4 neutral AltrX liner, size 5 standard Tri-Lock stem with a 36 +5 Delta ceramic ball.  SURGEON:  Madlyn Frankel. Charlann Boxer, MD  ASSISTANT:  Leilani Able, PA-C  ANESTHESIA:  General.  BLOOD LOSS:  About 200 cc.  DRAINS:  One Hemovac.  SPECIMENS:  None.  COMPLICATIONS:  None.  INDICATIONS FOR PROCEDURE:  Mr. Rahimi is a 55 year old gentleman who presented to the office for evaluation of left hip pain.  Radiographs showed evidence of femoral head avascular necrosis with collapse.  Unfortunately, there were no conservative nonoperative ways to manage his symptoms based on the progression of avascular necrosis.  After reviewing this with him and discussing risks and benefits of hip replacement surgery, he wished to proceed in this fashion.  Risks of infection, DVT, component failure, need for revision surgery all discussed and reviewed.  Approaches were discussed including anterior hip approach.  Consent was obtained for a left anterior hip replacement.  PROCEDURE IN DETAIL:  The patient was brought to operative theater. Once adequate anesthesia, preoperative antibiotics, Ancef administered, the patient was positioned supine on the OSI Hana table.  His left hip area was pre-prepped and then fluoroscopy used to confirm landmarks and positioning.  The left hip area for the anterior approach was then prepped and draped in sterile fashion with shower curtain technique.  A  time-out was performed identifying the patient, planned procedure and extremity.  An incision was made 2 cm lateral and distal to the anterior-superior iliac spine, extended over the anterior aspect of the trochanter.  The fascia of the tensor fascia lata was identified.  The muscle belly was swept laterally and retractor placed superiorly.  Pericapsular fat and circumflex vessels were cauterized.  Retractors were placed in the inferior neck capsule and rectus was elevated off the anterior acetabulum.  Capsulotomy was now made over the superior neck extending down to the trochanteric fossa towards the lesser trochanter.  Tag sutures were placed and retractors were placed intracapsular.  At this point, traction was applied.  Under fluoroscopic imaging, the neck cut was identified using the Bovie.  A neck osteotomy was made using oscillating saw.  Femoral head was removed, found to have significant avascular change with subchondral collapse.  Traction was removed.  Retractors were placed and posterior and anterior capsular and labral tissue debrided.  I began reaming with a 46 reamer, reamed up to a 51 reamer with good bony bed preparation.  A final 52 Pinnacle cup was then impacted with a curve impactor under fluoroscopic imaging confirming the amount of abduction and approximate anteversion. A single cancellous screw was used to support the initial fixation, a hole eliminator placed and the final 36 +  4 neutral AltrX liner impacted based on the cup position and orientation.  At this point, the femur was rolled 80 degrees of external rotation releasing the inferior capsule.  It was then brought back to neutral and hook placed over the lateral vastus ridge.  The femur was then elevated manually and held in position with a hook. Leg was then adducted and extended and retractors placed medially and posteriorly.  Soft tissue releases and posterior capsule release of the posterior aspect  of the hip was carried out at this point.  Following exposure of this proximal hip, I opened up the proximal femur using a box osteotome, then used a starting broach to set orientation.  With the starting broach in place, we confirmed the position under orthogonal radiographic views confirming the placement.  I then began broaching up where the retractors were placed with a size 2 broach and then carried this up to a size 5.  Size 5 broach seemed to have a good metaphyseal fit without any torsional movement.  At this point, a standard neck was applied and a trial reduction was carried out with 36 +1.5 trial ball. Trial reduction indicated that though the stem felt very stable, there may have been a little bit room to increase the size, however, again there was no movement.  Leg lengths on AP pelvis views showed that I was perhaps just a little bit shorter on this side.  At this point, all trial components were removed.  The final 5 standard stem was chosen after I reassessed the proximal fit of it.  I did not feel that I could get up to a size 6.  The size 5 standard Tri-Lock stem was then opened and impacted and set a little bit lower than the trial component did.  Based on this and the trial reduction, I trialed with a 36 +5 ball.  I felt leg lengths at this point were stable and comparable using fluoroscopic imaging.  We chose a 36 +5 Delta ceramic ball, which helped the offset and length combined.  The final ball was impacted on a clean and dry trunnion.  The hip was reduced.  It had been irrigated throughout the case and again at this point.  I reapproximated the anterior capsule using #1 Vicryl.  I placed a medium Hemovac drain deep.  The fascia of the tensor fascia was then reapproximated over top of the drain and the tensor fascia lata muscle.  The remainder of the wound was closed with 2-0 Vicryl, running 4-0 Monocryl.  The hip was cleaned, dried and dressed sterilely  using Dermabond and Aquacel dressing.  Drain site dressed separately.  He was then brought to recovery room extubated in stable condition tolerating the procedure well.     Madlyn Frankel Charlann Boxer, M.D.     MDO/MEDQ  D:  04/08/2011  T:  04/08/2011  Job:  [id number]  Electronically Signed by Durene Romans M.D. on 04/14/2011 09:39:42 AM

## 2011-04-14 NOTE — Op Note (Addendum)
  NAMECHAY, MAZZONI NO.:  [id number]  MEDICAL RECORD NO.:  [id number]  LOCATION:  1S                           FACILITY:  [facility]  PHYSICIAN:  Leonides Grills, M.D.     DATE OF BIRTH:  24-May-1956  DATE OF PROCEDURE:  03/19/2011 DATE OF DISCHARGE:                              OPERATIVE REPORT   PREOPERATIVE DIAGNOSES: 1. Left tarsal tunnel syndrome. 2. Left tight gastroc.  POSTOPERATIVE DIAGNOSES: 1. Left tarsal tunnel syndrome. 2. Left tight gastroc.  OPERATION: 1. Left tarsal tunnel release. 2. Left gastroc slide.  ANESTHESIA:  General.  SURGEON:  Leonides Grills, MD  ASSISTANT:  Richardean Canal, PA-C  ESTIMATED BLOOD LOSS:  Minimal.  TOURNIQUET TIME:  None.  COMPLICATIONS:  None.  DISPOSITION:  Stable to PR.  INDICATIONS:  This is a 55 year old male who has had longstanding metatarsalgia and burden in the plantar aspect of his foot with a documented x-rays consistent with tarsal tunnel syndrome.  He has had the contralateral side done and has had improvement.  He has consented to the above procedure.  All risks of infection or vessel injury, persistent worse pain, prolonged recovery, wound healing problems, DVT, PE were all explained, questions were encouraged and answered.  PROCEDURE IN DETAIL:  The patient was brought to the operative room, placed in a supine position.  After adequate general anesthesia was administered as well as Ancef 1 g IV piggyback, left lower extremity prepped and draped in sterile manner over a proximally placed thigh tourniquet.  Longitudinal incision on medial aspect of gastrocnemius muscle tendinous junction was then made.  Dissection was carried down through skin.  Hemostasis was obtained.  Fascia was opened in line of the incision.  Conjoined region was then developed between the gastroc soleus.  Soft tissue was elevated off posterior aspect of gastrocnemius. Sural nerves identified protected posteriorly throughout  the case. Gastrocnemius then released with curved Mayo scissors and excellent release of tight gastroc.  The area was copiously irrigated with normal saline.  Subcu was closed with 3-0 Vicryl, skin was closed with 4-0 Monocryl subcuticular stitch.  Steri-Strips were applied.  Limb was then gravity exsanguinated.  Tourniquet was inflated to 290 mmHg. Curvilinear incision on the posterior aspect of the ankle was then made. Dissection was carried down through skin.  Hemostasis was obtained. Flexor retinaculum over the neurovascular bundle was then carefully dissected out and released.  The lateral portion of the abductor hallucis fascia was also released as well.  The area was copiously irrigated with normal saline.  Tourniquet was inflated.  Hemostasis was obtained.  There was no pulsatile bleeding.  The area was copiously irrigated with normal saline.  Subcu was closed with 3-0 Vicryl and skin was closed with 4-0 nylon.  Sterile dressing was applied.  Cam walker boot was applied.  The patient was stable to PR.     Leonides Grills, M.D.     PB/MEDQ  D:  03/19/2011  T:  03/20/2011  Job:  [id number]  Electronically Signed by Leonides Grills M.D. on 04/14/2011 05:54:08 PM

## 2011-04-21 NOTE — Discharge Summary (Signed)
  NAMEFINNEAN, CERAMI NO.:  [id number]  MEDICAL RECORD NO.:  [id number]  LOCATION:  1528                         FACILITY:  [facility]  PHYSICIAN:  Madlyn Frankel. Charlann Boxer, M.D.  DATE OF BIRTH:  1955-11-07  DATE OF ADMISSION:  04/08/2011 DATE OF DISCHARGE:  04/10/2011                              DISCHARGE SUMMARY   ADMITTING DIAGNOSIS:  Left hip avascular necrosis.  DISCHARGE DIAGNOSES: 1. Left hip avascular necrosis status post left total hip replacement     on April 08, 2011. 2. Hypertension. 3. 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  ADMITTING HISTORY:  Mr. Tetzloff is a 55 year old gentleman with history of avascular necrosis to his left hip.  This is symptomatic.  His right hip has remained asymptomatic.  He has failed to respond to conservative measures and had progressive pain and wished at this point to proceed with more definitive options.  Risks and benefits of hip replacement surgery were discussed in the office and consent was obtained for a left total hip replacement for an anterior approach.  HOSPITAL COURSE:  The patient admitted for same-day surgery on April 08, 2011.  He underwent a left total hip replacement through an anterior approach.  Please see dictated operative note for full details of the procedure.  After routine stay in the recovery room, he was transferred to the hospital floor where he remained for his 2-day hospital stay.  He was seen and evaluated by Physical Therapy, is weightbearing as tolerated and no hip precautions.  His Foley catheter and Hemovac drains removed.  He was placed on a regular diet.  On postoperative day #1, he was noted to have hematocrit of 32.8 with stable electrolytes.  By day #2, his labs were stable, his vitals remained stable and he was ready to go home doing very well.  DISCHARGE CONDITION:  Stable at time of discharge.  DISCHARGE INSTRUCTIONS:  The patient to be  discharged home with home health physical therapy.  He will return to see Dr. Durene Romans in 2 weeks' time at [facility] office, number [phone].  He will keep his wound dry until followup.  He will maintain his surgical dressing for 8 days which is an Aquacel dressing.  He may shower immediately.  DISCHARGE MEDICATIONS:  Include Colace 100 mg p.o. b.i.d. as needed for constipation while on pain medicine, MiraLax 17 g p.o. daily as needed for constipation while on pain medicine, aspirin 325 mg p.o. b.i.d. for 30 days, Robaxin 500 mg every 6 hours for pain, Norco 7.5/325 one to two tablets every 4 to 6 hours as needed for pain, diclofenac as needed, Neurontin q.h.s., nicotine daily, losartan 100 mg q.a.m., lovastatin 20 mg q.a.m., ProAir and albuterol inhaler every 4 hours as needed 2 puffs, Symbicort 2 puffs b.i.d., triamterene/hydrochlorothiazide 37.5/25 mg p.o. q.a.m., vitamin C and vitamin D.     Madlyn Frankel Charlann Boxer, M.D.     MDO/MEDQ  D:  04/15/2011  T:  04/15/2011  Job:  [id number]  Electronically Signed by Durene Romans M.D. on 04/21/2011 10:15:59 AM

## 2012-12-27 ENCOUNTER — Other Ambulatory Visit: Payer: Self-pay | Admitting: Surgery

## 2013-05-04 ENCOUNTER — Encounter: Payer: Self-pay | Admitting: Gastroenterology

## 2013-06-29 ENCOUNTER — Other Ambulatory Visit: Payer: Self-pay | Admitting: Physician Assistant

## 2013-07-27 ENCOUNTER — Encounter: Payer: Self-pay | Admitting: Gastroenterology

## 2013-09-20 ENCOUNTER — Ambulatory Visit (AMBULATORY_SURGERY_CENTER): Payer: Self-pay

## 2013-09-20 VITALS — Ht 72.0 in | Wt 227.0 lb

## 2013-09-20 DIAGNOSIS — Z860100 Personal history of colon polyps, unspecified: Secondary | ICD-10-CM

## 2013-09-20 MED ORDER — MOVIPREP 100 G PO SOLR: 1.0000 | Freq: Once | ORAL | Status: DC

## 2013-09-28 ENCOUNTER — Encounter: Payer: Self-pay | Admitting: Gastroenterology

## 2013-10-04 ENCOUNTER — Encounter: Payer: Self-pay | Admitting: Gastroenterology

## 2013-10-04 ENCOUNTER — Ambulatory Visit (AMBULATORY_SURGERY_CENTER): Payer: 59 | Admitting: Gastroenterology

## 2013-10-04 VITALS — BP 142/95 | HR 59 | Temp 97.7°F | Resp 19 | Ht 72.0 in | Wt 227.0 lb

## 2013-10-04 DIAGNOSIS — D126 Benign neoplasm of colon, unspecified: Secondary | ICD-10-CM

## 2013-10-04 DIAGNOSIS — Z86010 Personal history of colonic polyps: Secondary | ICD-10-CM

## 2013-10-04 MED ORDER — SODIUM CHLORIDE 0.9 % IV SOLN: 500.0000 mL | INTRAVENOUS | Status: DC

## 2013-10-04 NOTE — Patient Instructions (Signed)
YOU HAD AN ENDOSCOPIC PROCEDURE TODAY AT THE [facility]: Refer to the procedure report that was given to you for any specific questions about what was found during the examination.  If the procedure report does not answer your questions, please call your gastroenterologist to clarify.  If you requested that your care partner not be given the details of your procedure findings, then the procedure report has been included in a sealed envelope for you to review at your convenience later.  YOU SHOULD EXPECT: Some feelings of bloating in the abdomen. Passage of more gas than usual.  Walking can help get rid of the air that was put into your GI tract during the procedure and reduce the bloating. If you had a lower endoscopy (such as a colonoscopy or flexible sigmoidoscopy) you may notice spotting of blood in your stool or on the toilet paper. If you underwent a bowel prep for your procedure, then you may not have a normal bowel movement for a few days.  DIET: Your first meal following the procedure should be a light meal and then it is ok to progress to your normal diet.  A half-sandwich or bowl of soup is an example of a good first meal.  Heavy or fried foods are harder to digest and may make you feel nauseous or bloated.  Likewise meals heavy in dairy and vegetables can cause extra gas to form and this can also increase the bloating.  Drink plenty of fluids but you should avoid alcoholic beverages for 24 hours.  ACTIVITY: Your care partner should take you home directly after the procedure.  You should plan to take it easy, moving slowly for the rest of the day.  You can resume normal activity the day after the procedure however you should NOT DRIVE or use heavy machinery for 24 hours (because of the sedation medicines used during the test).    SYMPTOMS TO REPORT IMMEDIATELY: A gastroenterologist can be reached at any hour.  During normal business hours, 8:30 AM to 5:00 PM Monday through Friday,  call [phone].  After hours and on weekends, please call the GI answering service at [phone] who will take a message and have the physician on call contact you.   Following lower endoscopy (colonoscopy or flexible sigmoidoscopy):  Excessive amounts of blood in the stool  Significant tenderness or worsening of abdominal pains  Swelling of the abdomen that is new, acute  Fever of 100F or higher  FOLLOW UP: If any biopsies were taken you will be contacted by phone or by letter within the next 1-3 weeks.  Call your gastroenterologist if you have not heard about the biopsies in 3 weeks.  Our staff will call the home number listed on your records the next business day following your procedure to check on you and address any questions or concerns that you may have at that time regarding the information given to you following your procedure. This is a courtesy call and so if there is no answer at the home number and we have not heard from you through the emergency physician on call, we will assume that you have returned to your regular daily activities without incident.  SIGNATURES/CONFIDENTIALITY: You and/or your care partner have signed paperwork which will be entered into your electronic medical record.  These signatures attest to the fact that that the information above on your After Visit Summary has been reviewed and is understood.  Full responsibility of the confidentiality of this   discharge information lies with you and/or your care-partner.  Handout on polyps and hemorrhoids diverticulosis and hhigh fiber diet Repeat colonoscopy in 3 years

## 2013-10-04 NOTE — Op Note (Addendum)
[place]  [address] [place], [address]   COLONOSCOPY PROCEDURE REPORT PATIENT: Mark Stevenson, Mark Stevenson  MR#: [id number] BIRTHDATE: 30-Mar-1956 , 5  yrs. old GENDER: Male ENDOSCOPIST: Ladene Artist, MD, [facility] PROCEDURE DATE:  10/04/2013 PROCEDURE:   Colonoscopy with snare polypectomy First Screening Colonoscopy - Avg.  risk and is 50 yrs.  old or older - No.  Prior Negative Screening - Now for repeat screening. N/A  History of Adenoma - Now for follow-up colonoscopy & has been > or = to 3 yrs.  Yes hx of adenoma.  Has been 3 or more years since last colonoscopy.  Polyps Removed Today? Yes. ASA CLASS:   Class II INDICATIONS:Patient's personal history of adenomatous colon polyps.  MEDICATIONS: MAC sedation, administered by CRNA and propofol (Diprivan) 300mg  IV DESCRIPTION OF PROCEDURE:   After the risks benefits and alternatives of the procedure were thoroughly explained, informed consent was obtained.  A digital rectal exam revealed no abnormalities of the rectum.   The [id number]  endoscope was introduced through the anus and advanced to the cecum, which was identified by both the appendix and ileocecal valve. The photo of the appendiceal orifice did not capture. No adverse events experienced.   The quality of the prep was good, using MoviPrep The instrument was then slowly withdrawn as the colon was fully examined.  COLON FINDINGS: A sessile polyp measuring 7 mm in size was found at the cecum.  A polypectomy was performed with a cold snare.  The resection was complete and the polyp tissue was completely retrieved.   A sessile polyp measuring 5 mm in size was found in the transverse colon.  A polypectomy was performed with a cold snare.  The resection was complete and the polyp tissue was completely retrieved.   A sessile polyp measuring 5 mm in size was found in the sigmoid colon.  A polypectomy was performed with a cold snare.  The resection was  complete and the polyp tissue was completely retrieved.   Mild diverticulosis was noted in the transverse colon and sigmoid colon.   Prior tattoos in transverse colon.   The colon was otherwise normal.  There was no diverticulosis, inflammation, polyps or cancers unless previously stated.  Retroflexed views revealed small internal hemorrhoids. The time to cecum=2 minutes 38 seconds.  Withdrawal time=10 minutes 15 seconds.  The scope was withdrawn and the procedure completed. COMPLICATIONS: There were no complications. ENDOSCOPIC IMPRESSION: 1.   Sessile polyp measuring 7 mm at the cecum; polypectomy performed with a cold snare 2.   Sessile polyp measuring 5 mm in the transverse colon; polypectomy performed with a cold snare 3.   Sessile polyp measuring 5 mm in the sigmoid colon; polypectomy performed with a cold snare 4.   Mild diverticulosis in the transverse colon and sigmoid colon 5.   Prior tattoos in transverse colon. 6.   Small internal hemorrhoids RECOMMENDATIONS: 1.  Await pathology results 2.  High fiber diet with liberal fluid intake. 3.  Repeat Colonoscopy in 3 years. eSigned:  Ladene Artist, MD, [facility] 10/04/2013 10:45 AM Revised: 10/04/2013 10:45 AM cc: Donnie Coffin, MD   PATIENT NAME:  Mark Stevenson, Mark Stevenson MR#: [id number]

## 2013-10-04 NOTE — Progress Notes (Signed)
Called to room to assist during endoscopic procedure.  Patient ID and intended procedure confirmed with present staff. Received instructions for my participation in the procedure from the performing physician.  

## 2013-10-04 NOTE — Progress Notes (Signed)
Patient did not experience any of the following events: a burn prior to discharge; a fall within the facility; wrong site/side/patient/procedure/implant event; or a hospital transfer or hospital admission upon discharge from the facility. (G8907) Patient did not have preoperative order for IV antibiotic SSI prophylaxis. (G8918)  

## 2013-10-04 NOTE — Progress Notes (Signed)
Lidocaine-40mg IV prior to Propofol InductionPropofol given over incremental dosages 

## 2013-10-05 ENCOUNTER — Telehealth: Payer: Self-pay | Admitting: *Deleted

## 2013-10-05 NOTE — Telephone Encounter (Signed)
  Follow up Call-  Call back number 10/04/2013  Post procedure Call Back phone  # [phone]  Permission to leave phone message Yes     Patient questions:  Do you have a fever, pain , or abdominal swelling? no Pain Score  0 *  Have you tolerated food without any problems? yes  Have you been able to return to your normal activities? yes  Do you have any questions about your discharge instructions: Diet   no Medications  no Follow up visit  no  Do you have questions or concerns about your Care? no  Actions: * If pain score is 4 or above: No action needed, pain <4.

## 2013-10-10 ENCOUNTER — Encounter: Payer: Self-pay | Admitting: Gastroenterology

## 2015-02-27 ENCOUNTER — Encounter: Payer: Self-pay | Admitting: Gastroenterology

## 2015-04-24 ENCOUNTER — Other Ambulatory Visit: Payer: Self-pay | Admitting: Family Medicine

## 2015-04-24 ENCOUNTER — Ambulatory Visit
Admission: RE | Admit: 2015-04-24 | Discharge: 2015-04-24 | Disposition: A | Discharge status: Home / Self Care | Payer: 59 | Source: Ambulatory Visit | Attending: Family Medicine | Admitting: Family Medicine

## 2015-04-24 DIAGNOSIS — J449 Chronic obstructive pulmonary disease, unspecified: Secondary | ICD-10-CM

## 2015-08-17 ENCOUNTER — Encounter: Payer: Self-pay | Admitting: Emergency Medicine

## 2015-08-17 ENCOUNTER — Ambulatory Visit
Admission: EM | Admit: 2015-08-17 | Discharge: 2015-08-17 | Disposition: A | Discharge status: Home / Self Care | Payer: 59 | Attending: Family Medicine | Admitting: Family Medicine

## 2015-08-17 DIAGNOSIS — L02215 Cutaneous abscess of perineum: Secondary | ICD-10-CM

## 2015-08-17 LAB — URINALYSIS COMPLETE WITH MICROSCOPIC (ARMC ONLY)
Bilirubin Urine: NEGATIVE
GLUCOSE, UA: NEGATIVE mg/dL
Ketones, ur: NEGATIVE mg/dL
LEUKOCYTES UA: NEGATIVE
Nitrite: NEGATIVE
PH: 6.5 (ref 5.0–8.0)
PROTEIN: 30 mg/dL — AB
SPECIFIC GRAVITY, URINE: 1.02 (ref 1.005–1.030)
WBC UA: NONE SEEN WBC/hpf (ref 0–5)

## 2015-08-17 LAB — CBC WITH DIFFERENTIAL/PLATELET
Basophils Absolute: 0 10*3/uL (ref 0–0.1)
Basophils Relative: 0 %
Eosinophils Absolute: 0.7 10*3/uL (ref 0–0.7)
Eosinophils Relative: 5 %
HEMATOCRIT: 44 % (ref 40.0–52.0)
HEMOGLOBIN: 15 g/dL (ref 13.0–18.0)
LYMPHS ABS: 1.1 10*3/uL (ref 1.0–3.6)
Lymphocytes Relative: 8 %
MCH: 33.6 pg (ref 26.0–34.0)
MCHC: 34.2 g/dL (ref 32.0–36.0)
MCV: 98.4 fL (ref 80.0–100.0)
MONO ABS: 1.7 10*3/uL — AB (ref 0.2–1.0)
MONOS PCT: 12 %
NEUTROS ABS: 10.7 10*3/uL — AB (ref 1.4–6.5)
NEUTROS PCT: 75 %
Platelets: 163 10*3/uL (ref 150–440)
RBC: 4.47 MIL/uL (ref 4.40–5.90)
RDW: 13.2 % (ref 11.5–14.5)
WBC: 14.3 10*3/uL — ABNORMAL HIGH (ref 3.8–10.6)

## 2015-08-17 LAB — COMPREHENSIVE METABOLIC PANEL
ALBUMIN: 3.6 g/dL (ref 3.5–5.0)
ALT: 14 U/L — ABNORMAL LOW (ref 17–63)
ANION GAP: 10 (ref 5–15)
AST: 22 U/L (ref 15–41)
Alkaline Phosphatase: 58 U/L (ref 38–126)
BUN: 13 mg/dL (ref 6–20)
CHLORIDE: 99 mmol/L — AB (ref 101–111)
CO2: 25 mmol/L (ref 22–32)
Calcium: 9.2 mg/dL (ref 8.9–10.3)
Creatinine, Ser: 0.84 mg/dL (ref 0.61–1.24)
GFR calc non Af Amer: >60 mL/min (ref >60)
GLUCOSE: 105 mg/dL — AB (ref 65–99)
POTASSIUM: 3.8 mmol/L (ref 3.5–5.1)
SODIUM: 134 mmol/L — AB (ref 135–145)
Total Bilirubin: 1.3 mg/dL — ABNORMAL HIGH (ref 0.3–1.2)
Total Protein: 7.2 g/dL (ref 6.5–8.1)

## 2015-08-17 MED ORDER — CEFAZOLIN SODIUM 1 G IJ SOLR
1.0000 g | Freq: Once | INTRAMUSCULAR | Status: AC
Start: 2015-08-17 — End: 2015-08-17
  Administered 2015-08-17: 1 g via INTRAMUSCULAR

## 2015-08-17 MED ORDER — SULFAMETHOXAZOLE-TRIMETHOPRIM 800-160 MG PO TABS: 1.0000 | ORAL_TABLET | Freq: Two times a day (BID) | ORAL | Status: DC

## 2015-08-17 NOTE — Discharge Instructions (Signed)
Abscess An abscess is an infected area that contains a collection of pus and debris.It can occur in almost any part of the body. An abscess is also known as a furuncle or boil. CAUSES  An abscess occurs when tissue gets infected. This can occur from blockage of oil or sweat glands, infection of hair follicles, or a minor injury to the skin. As the body tries to fight the infection, pus collects in the area and creates pressure under the skin. This pressure causes pain. People with weakened immune systems have difficulty fighting infections and get certain abscesses more often.  SYMPTOMS Usually an abscess develops on the skin and becomes a painful mass that is red, warm, and tender. If the abscess forms under the skin, you may feel a moveable soft area under the skin. Some abscesses break open (rupture) on their own, but most will continue to get worse without care. The infection can spread deeper into the body and eventually into the bloodstream, causing you to feel ill.  DIAGNOSIS  Your caregiver will take your medical history and perform a physical exam. A sample of fluid may also be taken from the abscess to determine what is causing your infection. TREATMENT  Your caregiver may prescribe antibiotic medicines to fight the infection. However, taking antibiotics alone usually does not cure an abscess. Your caregiver may need to make a small cut (incision) in the abscess to drain the pus. In some cases, gauze is packed into the abscess to reduce pain and to continue draining the area. HOME CARE INSTRUCTIONS   Only take over-the-counter or prescription medicines for pain, discomfort, or fever as directed by your caregiver.  If you were prescribed antibiotics, take them as directed. Finish them even if you start to feel better.  If gauze is used, follow your caregiver's directions for changing the gauze.  To avoid spreading the infection:  Keep your draining abscess covered with a  bandage.  Wash your hands well.  Do not share personal care items, towels, or whirlpools with others.  Avoid skin contact with others.  Keep your skin and clothes clean around the abscess.  Keep all follow-up appointments as directed by your caregiver. SEEK MEDICAL CARE IF:   You have increased pain, swelling, redness, fluid drainage, or bleeding.  You have muscle aches, chills, or a general ill feeling.  You have a fever. MAKE SURE YOU:   Understand these instructions.  Will watch your condition.  Will get help right away if you are not doing well or get worse.   This information is not intended to replace advice given to you by your health care provider. Make sure you discuss any questions you have with your health care provider.   Document Released: 06/25/2005 Document Revised: 03/16/2012 Document Reviewed: 11/28/2011 Elsevier Interactive Patient Education 2016 [place] UP ON IN 2 DAYS (SUNDAY) FOR RECHECK OF INFECTION

## 2015-08-17 NOTE — ED Notes (Signed)
Wound and peri area cleansed and 4x4's and sanitary napkin placed on patient

## 2015-08-17 NOTE — ED Provider Notes (Signed)
CSN: [id number]     Arrival date & time 08/17/15  0810 History   First MD Initiated Contact with Patient 08/17/15 [phone]     Chief Complaint  Patient presents with  . Abscess   (Consider location/radiation/quality/duration/timing/severity/associated sxs/prior Treatment) HPI Comments: 59 yo male presents with a complaint of a progressively worsening "boil in the groin area" for one month. States has worsened, with more tenderness and increase in size for the last week. Denies fevers or chills, but states he's had night sweats.   The history is provided by the patient.    Past Medical History  Diagnosis Date  . Hypertension    Past Surgical History  Procedure Laterality Date  . Total hip arthroplasty  2012  . Knee arthroscopy      x3 bilat  . Broken knee cap    . Back surgery    . Crushed l-4 vertebrae  2008  . Ruptured disc    . Ankles      tarsal tunnel surgery bilat  . Tendons in calves lengthened      plantar fascitis   Family History  Problem Relation Age of Onset  . Pancreatic cancer Neg Hx   . Stomach cancer Neg Hx   . Colon cancer Son   . Liver cancer Son    Social History  Substance Use Topics  . Smoking status: Current Every Day Smoker  . Smokeless tobacco: Never Used  . Alcohol Use: 6.0 oz/week    10 Shots of liquor per week    Review of Systems  Allergies  Review of patient's allergies indicates no known allergies.  Home Medications   Prior to Admission medications   Medication Sig Start Date End Date Taking? Authorizing Provider  Cholecalciferol (VITAMIN D3) 2000 UNITS TABS Take by mouth.    Historical Provider, MD  gabapentin (NEURONTIN) 600 MG tablet Take 600 mg by mouth 3 (three) times daily.    Historical Provider, MD  losartan (COZAAR) 100 MG tablet Take 100 mg by mouth daily.    Historical Provider, MD  LOVASTATIN PO Take by mouth.    Historical Provider, MD  sulfamethoxazole-trimethoprim (BACTRIM DS,SEPTRA DS) 800-160 MG tablet Take 1 tablet  by mouth 2 (two) times daily. 08/17/15   Norval Gable, MD  triamterene-hydrochlorothiazide (MAXZIDE-25) 37.5-25 MG per tablet Take 1 tablet by mouth daily.    Historical Provider, MD   Meds Ordered and Administered this Visit   Medications  ceFAZolin (ANCEF) injection 1 g (1 g Intramuscular Given 08/17/15 1015)    BP 159/92 mmHg  Pulse 91  Temp(Src) 98.4 F (36.9 C) (Tympanic)  Resp 16  Ht 6' (1.829 m)  Wt 216 lb (97.977 kg)  BMI 29.29 kg/m2  SpO2 98% No data found.   Physical Exam  Constitutional: He appears well-developed and well-nourished. No distress.  Genitourinary:     5cm subcutaneous fluctuant mass on the perineum; and  overlying skin with blanchable erythema, warmth and tenderness to palpation; no drainage  Skin: He is not diaphoretic. There is erythema.  Nursing note and vitals reviewed.   ED Course  Procedures (including critical care time)  Labs Review Labs Reviewed  URINALYSIS COMPLETEWITH MICROSCOPIC ([facility]) - Abnormal; Notable for the following:    Hgb urine dipstick 2+ (*)    Protein, ur 30 (*)    Bacteria, UA FEW (*)    Squamous Epithelial / LPF 0-5 (*)    All other components within normal limits  COMPREHENSIVE METABOLIC PANEL - Abnormal; Notable  for the following:    Sodium 134 (*)    Chloride 99 (*)    Glucose, Bld 105 (*)    ALT 14 (*)    Total Bilirubin 1.3 (*)    All other components within normal limits  CBC WITH DIFFERENTIAL/PLATELET - Abnormal; Notable for the following:    WBC 14.3 (*)    Neutro Abs 10.7 (*)    Monocytes Absolute 1.7 (*)    All other components within normal limits  URINE CULTURE  CULTURE, ROUTINE-ABSCESS    Imaging Review No results found.   Visual Acuity Review  Right Eye Distance:   Left Eye Distance:   Bilateral Distance:    Right Eye Near:   Left Eye Near:    Bilateral Near:         MDM   1. Cutaneous abscess of perineum    Discharge Medication List as of 08/17/2015 10:45 AM      START taking these medications   Details  sulfamethoxazole-trimethoprim (BACTRIM DS,SEPTRA DS) 800-160 MG tablet Take 1 tablet by mouth 2 (two) times daily., Starting 08/17/2015, Until Discontinued, Normal       1. Lab results and diagnosis reviewed with patient; informed consent obtained for I&D; area cleaned, prepped, anesthetized with 1% lidocaine with epi and 2mm incision made with #11 blade, with expression of copious amount of purulent and purulent-sanguinous drainage; sample collected for abscess culture; wound dressed; patient tolerated procedure well  2. rx as per orders above; reviewed possible side effects, interactions, risks and benefits  3. Recommend supportive treatment with warm compresses or sitz baths to area 4. Follow-up in 2 days for recheck infection resolving or prn sooner if symptoms worsen    Norval Gable, MD 08/17/15 1447

## 2015-08-17 NOTE — ED Notes (Signed)
Patient states that he has had an abscess for month in his groin area and that for the past 2 days it has gotten worse.  Patient also reports blood in his urine and difficulty urinating.  Patient reports fevers.

## 2015-08-19 ENCOUNTER — Ambulatory Visit
Admission: EM | Admit: 2015-08-19 | Discharge: 2015-08-19 | Disposition: A | Discharge status: Home / Self Care | Payer: 59 | Attending: Family Medicine | Admitting: Family Medicine

## 2015-08-19 ENCOUNTER — Encounter: Payer: Self-pay | Admitting: Gynecology

## 2015-08-19 DIAGNOSIS — Z4801 Encounter for change or removal of surgical wound dressing: Secondary | ICD-10-CM

## 2015-08-19 DIAGNOSIS — L02215 Cutaneous abscess of perineum: Secondary | ICD-10-CM

## 2015-08-19 LAB — URINE CULTURE
Culture: NO GROWTH
Special Requests: NORMAL

## 2015-08-19 NOTE — ED Notes (Signed)
Recheck boil at peri area. Pt was seen on 08/17/2015.

## 2015-08-19 NOTE — Discharge Instructions (Signed)
Continue TMP_SMZ antibiotic as ordered  Heat pad to comfy chair with paperguard  Warm tub soaks    Add Ibuprofen 200mg      2-3 tablets  3 times a day with meals for next 3 days  ---STOP IF STOMACH IRRITATION !!!    Loratadine 10 mg   1 a day \  Fluticasone  1 spray per nostril per day .Marland KitchenMarland KitchenOK twice for a few days   Return to see [place] in one week unless totally gone...or f/u with PCP as desired

## 2015-08-20 LAB — CULTURE, ROUTINE-ABSCESS
Culture: NO GROWTH
SPECIAL REQUESTS: NORMAL

## 2015-08-25 NOTE — ED Provider Notes (Signed)
CSN: [id number]     Arrival date & time 08/19/15  0935 History   First MD Initiated Contact with Patient 08/19/15 1028     Chief Complaint  Patient presents with  . Wound Check   (Consider location/radiation/quality/duration/timing/severity/associated sxs/prior Treatment) HPI  59 yo M present for follow up on perineal abscess seen 11/18 and put on TMP-SMZ. Had spontaneous rupture yesterday with significant amount fo drainage by his report. Feels much better,  Mass size decreased as well as pain- continues on atb. Also concerned about seasonal allergies , recently  flared. Smoker with post nasal drip  Past Medical History  Diagnosis Date  . Hypertension    Past Surgical History  Procedure Laterality Date  . Total hip arthroplasty  2012  . Knee arthroscopy      x3 bilat  . Broken knee cap    . Back surgery    . Crushed l-4 vertebrae  2008  . Ruptured disc    . Ankles      tarsal tunnel surgery bilat  . Tendons in calves lengthened      plantar fascitis   Family History  Problem Relation Age of Onset  . Pancreatic cancer Neg Hx   . Stomach cancer Neg Hx   . Colon cancer Son   . Liver cancer Son    Social History  Substance Use Topics  . Smoking status: Current Every Day Smoker  . Smokeless tobacco: Never Used  . Alcohol Use: 6.0 oz/week    10 Shots of liquor per week    Review of Systems 10 Systems reviewed and are negative for acute change except as noted in the HPI.  Allergies  Review of patient's allergies indicates no known allergies.  Home Medications   Prior to Admission medications   Medication Sig Start Date End Date Taking? Authorizing Provider  Cholecalciferol (VITAMIN D3) 2000 UNITS TABS Take by mouth.   Yes Historical Provider, MD  gabapentin (NEURONTIN) 600 MG tablet Take 600 mg by mouth 3 (three) times daily.   Yes Historical Provider, MD  losartan (COZAAR) 100 MG tablet Take 100 mg by mouth daily.   Yes Historical Provider, MD  LOVASTATIN PO Take  by mouth.   Yes Historical Provider, MD  sulfamethoxazole-trimethoprim (BACTRIM DS,SEPTRA DS) 800-160 MG tablet Take 1 tablet by mouth 2 (two) times daily. 08/17/15  Yes Norval Gable, MD  triamterene-hydrochlorothiazide (MAXZIDE-25) 37.5-25 MG per tablet Take 1 tablet by mouth daily.   Yes Historical Provider, MD   Meds Ordered and Administered this Visit  Medications - No data to display  BP 138/77 mmHg  Pulse 98  Temp(Src) 98 F (36.7 C) (Tympanic)  Resp 18  Ht 6' (1.829 m)  Wt 216 lb (97.977 kg)  BMI 29.29 kg/m2  SpO2 98% No data found.   Physical Exam  General: NAD, does not appear toxic HEENT:normocephalic,atraumatic, mucous membranes moist,grossly normal hearing; ears feel full Eyes: EOMI, conjunctiva clear, conjugate gaze Neck: supple,no lymphadenopathy Resp : CT A, bilat; normal respiratory effort, cough nonproductiove Card : RRR Abd:  Not distended Skin: no rash, skin intact MSK: no deformities, ambulatory without assistance Neuro : good attention,recall-good memory, no focal neuro deficits Psych: speech and behavior appropriate GU- dramatic improvement from previous description. Now with left side induration only. Tender but reported as much better. Sites identified on perineum with spontaneous rupture. Weeping slightly only. Rectal exam deferred  ED Course  Procedures (including critical care time)  Labs Review Labs Reviewed - No data to display  Imaging Review No results found.    MDM   1. Cutaneous abscess of perineum    Diagnosis and treatment discussed.  Questions fielded, expectations and recommendations reviewed Recommend wamr tub baths 20-30 minutes BID ]Use ladies perineal pad for pants protection. Add loratadine and flonase daily Discussed follow up and return parameters including no resolution or any worsening condition. . Patient expresses understanding  and agrees to plan. Will return to [facility] with questions, concerns or exacerbation.  Is  scheduled to see pCP in one wek for other issues and can have him examine if there is continued improvement Report back to [facility] if flare    Jan Fireman, PA-C 08/25/15 1428

## 2015-12-14 ENCOUNTER — Other Ambulatory Visit: Payer: Self-pay | Admitting: Specialist

## 2015-12-14 DIAGNOSIS — M5431 Sciatica, right side: Secondary | ICD-10-CM

## 2015-12-18 ENCOUNTER — Ambulatory Visit
Admission: RE | Admit: 2015-12-18 | Discharge: 2015-12-18 | Disposition: A | Discharge status: Home / Self Care | Payer: 59 | Source: Ambulatory Visit | Attending: Specialist | Admitting: Specialist

## 2015-12-18 DIAGNOSIS — M5431 Sciatica, right side: Secondary | ICD-10-CM

## 2016-01-04 ENCOUNTER — Encounter: Payer: Self-pay | Admitting: Surgery

## 2016-01-15 ENCOUNTER — Encounter: Payer: Self-pay | Admitting: Surgery

## 2016-01-15 ENCOUNTER — Ambulatory Visit (INDEPENDENT_AMBULATORY_CARE_PROVIDER_SITE_OTHER): Payer: 59 | Admitting: Surgery

## 2016-01-15 VITALS — BP 126/78 | HR 98 | Temp 97.0°F | Resp 18 | Ht 71.0 in | Wt 215.0 lb

## 2016-01-15 DIAGNOSIS — I70212 Atherosclerosis of native arteries of extremities with intermittent claudication, left leg: Secondary | ICD-10-CM | POA: Diagnosis not present

## 2016-01-15 MED ORDER — CILOSTAZOL 100 MG PO TABS: 100.0000 mg | ORAL_TABLET | Freq: Two times a day (BID) | ORAL | Status: DC

## 2016-01-15 NOTE — Addendum Note (Signed)
Addended by: Mena Goes on: 01/15/2016 10:35 AM   Modules accepted: Orders

## 2016-01-15 NOTE — Progress Notes (Signed)
Patient name: Mark Stevenson MRN: [id number] DOB: [birth year] Sex: male   Referred by: Dr. Tonita Cong  Reason for referral:  Chief Complaint  Patient presents with  . PVD    claudication in left leg per Dr. Tonita Cong , has had multiple back and hip surgeries.  LLE pain started 2012 after left THR by Dr. Alvan Dame    HISTORY OF PRESENT ILLNESS: This is a 60 year old gentleman who is referred today for evaluation of left hip pain with walking.  The patient states that for the past year, he has been having worsening pain in his left hip with activity.  He states that it up proximally 75 yards he developed severe pain which goes away with rest.  It is advancing, to where now he has trouble sometimes walking around the house.  The patient has a significant orthopedic history.  He has undergone repair of a knee fracture many years ago.  This was complicated by RSD.  He was treated with nerve blocks which helped.  He is also undergone lumbar disc surgery and in 2012 left hip total arthroplasty.  He is currently being evaluated for spinal stenosis at his schedule for injections tomorrow.  Patient suffers from hypercholesterolemia which is treated with a statin.  He is a current smoker.  He does not take an aspirin.  Past Medical History  Diagnosis Date  . Hypertension     Past Surgical History  Procedure Laterality Date  . Total hip arthroplasty  2012  . Knee arthroscopy      x3 bilat  . Broken knee cap    . Back surgery    . Crushed l-4 vertebrae  2008  . Ruptured disc    . Ankles      tarsal tunnel surgery bilat  . Tendons in calves lengthened      plantar fascitis    Social History   Social History  . Marital Status: Married    Spouse Name: N/A  . Number of Children: N/A  . Years of Education: N/A   Occupational History  . Not on file.   Social History Main Topics  . Smoking status: Current Every Day Smoker -- 1.50 packs/day    Types: Cigarettes  . Smokeless tobacco: Never  Used  . Alcohol Use: 6.0 oz/week    10 Shots of liquor per week  . Drug Use: Not on file  . Sexual Activity: Not on file   Other Topics Concern  . Not on file   Social History Narrative    Family History  Problem Relation Age of Onset  . Pancreatic cancer Neg Hx   . Stomach cancer Neg Hx   . Colon cancer Son   . Liver cancer Son     Allergies as of 01/15/2016  . (No Known Allergies)    Current Outpatient Prescriptions on File Prior to Visit  Medication Sig Dispense Refill  . Cholecalciferol (VITAMIN D3) 2000 UNITS TABS Take by mouth.    . gabapentin (NEURONTIN) 600 MG tablet Take 600 mg by mouth 3 (three) times daily.    Marland Kitchen losartan (COZAAR) 100 MG tablet Take 100 mg by mouth daily.    Marland Kitchen LOVASTATIN PO Take 20 mg by mouth daily.     Marland Kitchen triamterene-hydrochlorothiazide (MAXZIDE-25) 37.5-25 MG per tablet Take 1 tablet by mouth daily.     No current facility-administered medications on file prior to visit.     REVIEW OF SYSTEMS: Cardiovascular: No chest pain, chest pressure, palpitations, orthopnea,  or dyspnea on exertion.  Left leg pain with walking Pulmonary: No productive cough, asthma positive for wheezing. Neurologic: No weakness, paresthesias, aphasia, or amaurosis.  Positive dizziness. Hematologic: No bleeding problems or clotting disorders. Musculoskeletal: No joint pain or joint swelling. Gastrointestinal: No blood in stool or hematemesis Genitourinary: No dysuria or hematuria. Psychiatric:: No history of major depression. Integumentary: No rashes or ulcers. Constitutional: No fever or chills.  PHYSICAL EXAMINATION:  Filed Vitals:   01/15/16 0918  BP: 126/78  Pulse: 98  Temp: 97 F (36.1 C)  TempSrc: Oral  Resp: 18  Height: 5\' 11"  (1.803 m)  Weight: 215 lb (97.523 kg)  SpO2: 97%   Body mass index is 30 kg/(m^2). General: The patient appears their stated age.   HEENT:  No gross abnormalities Pulmonary: Respirations are non-labored Abdomen: Soft and  non-tender  Musculoskeletal: There are no major deformities.   Neurologic: No focal weakness or paresthesias are detected, Skin: There are no ulcer or rashes noted. Psychiatric: The patient has normal affect. Cardiovascular: There is a regular rate and rhythm without significant murmur appreciated.No carotid bruits.  Palpable femoral pulse bilaterally, right greater than left.  Palpable process pedis pulse, right greater than left  Diagnostic Studies: I have reviewed his duplex ultrasound.  ABIs 1.1 on the right and 0.9 on the left.  The patient has abnormal monophasic waveforms on the left.  This pressure gradient change between the thigh and calf on the left suggesting femoral popliteal disease.    Medication Changes: Patient will be given a prescription for cilostazol Also starting him on a baby aspirin  Assessment:  Left leg pain Plan: Based on the patient's ABIs and duplex ultrasound, there is certainly a possibility that he has atherosclerotic changes responsible for his symptoms.  These would have to be in the aortoiliac segment.  His symptoms could also be related to orthopedic and low back issues.  The patient is scheduled for a spinal injection tomorrow.  I would defer any vascular evaluation until this is been completed.  If he does not get the results he is hoping for, we have decided to start him on cilostazol to see if this improves his walking distance.  If he does not get any benefit from cilostazol after one month, we'll schedule him for angiography.  This would be done through a right femoral approach.  Intervention would be limited to the aortoiliac section, as his symptoms are hip claudication.  I also discussed the importance of smoking cessation as well as the importance of taking a baby aspirin.  He will discuss this with Dr. Jeanell Sparrow most tomorrow to determine the appropriate starting point, since he is getting back injections tomorrow     V. Leia Alf,  M.D. [facility] Office: [phone] Pager:  [phone]

## 2016-02-27 ENCOUNTER — Other Ambulatory Visit: Payer: Self-pay | Admitting: *Deleted

## 2016-02-27 DIAGNOSIS — I70212 Atherosclerosis of native arteries of extremities with intermittent claudication, left leg: Secondary | ICD-10-CM

## 2016-02-27 MED ORDER — CILOSTAZOL 100 MG PO TABS: 100.0000 mg | ORAL_TABLET | Freq: Two times a day (BID) | ORAL | Status: DC

## 2016-03-26 ENCOUNTER — Encounter: Payer: Self-pay | Admitting: Surgery

## 2016-04-07 ENCOUNTER — Ambulatory Visit: Payer: 59 | Admitting: Surgery

## 2016-04-10 ENCOUNTER — Encounter: Payer: Self-pay | Admitting: Surgery

## 2016-04-14 ENCOUNTER — Other Ambulatory Visit: Payer: Self-pay

## 2016-04-14 ENCOUNTER — Ambulatory Visit (INDEPENDENT_AMBULATORY_CARE_PROVIDER_SITE_OTHER): Payer: 59 | Admitting: Surgery

## 2016-04-14 ENCOUNTER — Encounter: Payer: Self-pay | Admitting: Surgery

## 2016-04-14 VITALS — BP 142/88 | HR 77 | Temp 98.4°F | Resp 18 | Ht 71.0 in | Wt 216.0 lb

## 2016-04-14 DIAGNOSIS — I70212 Atherosclerosis of native arteries of extremities with intermittent claudication, left leg: Secondary | ICD-10-CM

## 2016-04-14 NOTE — Progress Notes (Signed)
[facility] [place]  Patient name: Mark Stevenson MRN: [id number] DOB: [birth year] Sex: male  REASON FOR VISIT: Follow-up  HPI: Mark Stevenson is a 60 y.o. male who presents for continued follow-up of his left hip claudication. He states that he has left hip claudication after ambulating approximately 75-100 yards. His pain is better with rest. He was last seen 3 months ago and was prescribed cilostazol. He says that he was unable to tolerate the side effects of the medicine. He continues to smoke. He was advised to start taking a baby aspirin but has not done so.   He has a significant orthopedic history including left hip total arthroplasty, repair of a knee fracture, and lumbar disc surgery.  He has complained of 3 episodes in the past few months regarding his vision "closing in" while driving. This is associated with bilateral blurriness. He denies any loss of vision. These episodes last about 35-45 seconds. He denies any history of TIA or stroke. He is also complained of dizziness.  He takes a statin for hypercholesterolemia.  Past Medical History  Diagnosis Date  . Hypertension     Family History  Problem Relation Age of Onset  . Pancreatic cancer Neg Hx   . Stomach cancer Neg Hx   . Colon cancer Son   . Liver cancer Son     SOCIAL HISTORY: Social History  Substance Use Topics  . Smoking status: Current Every Day Smoker -- 1.50 packs/day    Types: Cigarettes  . Smokeless tobacco: Never Used  . Alcohol Use: 6.0 oz/week    10 Shots of liquor per week    No Known Allergies  Current Outpatient Prescriptions  Medication Sig Dispense Refill  . Cholecalciferol (VITAMIN D3) 2000 UNITS TABS Take by mouth.    . cilostazol (PLETAL) 100 MG tablet Take 1 tablet (100 mg total) by mouth 2 (two) times daily before a meal. 60 tablet 11  . gabapentin (NEURONTIN) 600 MG tablet Take 600 mg by mouth 3 (three) times daily.    Marland Kitchen losartan (COZAAR) 100 MG  tablet Take 100 mg by mouth daily.    Marland Kitchen LOVASTATIN PO Take 20 mg by mouth daily.     Marland Kitchen triamterene-hydrochlorothiazide (MAXZIDE-25) 37.5-25 MG per tablet Take 1 tablet by mouth daily.     No current facility-administered medications for this visit.    REVIEW OF SYSTEMS:  [X]  denotes positive finding, [ ]  denotes negative finding Cardiac  Comments:  Chest pain or chest pressure:    Shortness of breath upon exertion: x   Short of breath when lying flat:    Irregular heart rhythm:        Vascular    Pain in calf, thigh, or hip brought on by ambulation: x Left hip  Pain in feet at night that wakes you up from your sleep:     Blood clot in your veins:    Leg swelling:         Pulmonary    Oxygen at home:    Productive cough:  x   Wheezing:  x       Neurologic    Sudden weakness in arms or legs:     Sudden numbness in arms or legs:     Sudden onset of difficulty speaking or slurred speech:    Temporary loss of vision in one eye:     Problems with dizziness:  x       Gastrointestinal  Blood in stool:     Vomited blood:         Genitourinary    Burning when urinating:     Blood in urine:        Psychiatric    Major depression:         Hematologic    Bleeding problems:    Problems with blood clotting too easily:        Skin    Rashes or ulcers:        Constitutional    Fever or chills:      PHYSICAL EXAM: Filed Vitals:   04/14/16 1508 04/14/16 1511  BP: 150/86 142/88  Pulse: 77   Temp: 98.4 F (36.9 C)   TempSrc: Oral   Resp: 18   Height: 5\' 11"  (1.803 m)   Weight: 216 lb (97.977 kg)   SpO2: 96%     GENERAL: The patient is a well-nourished male, in no acute distress. The vital signs are documented above. CARDIAC: There is a regular rate and rhythm. No carotid bruits. VASCULAR: 2+ right femoral pulse. Nonpalpable left femoral pulse. Nonpalpable popliteal pulses. Palpable pedal pulses bilaterally. PULMONARY: There is good air exchange bilaterally without  wheezing or rales. MUSCULOSKELETAL: There are no major deformities or cyanosis. NEUROLOGIC: No focal weakness or paresthesias are detected. SKIN: There are no ulcers or rashes noted. PSYCHIATRIC: The patient has a normal affect.  DATA:  Previous ABIs from April 2017 revealed an 0.1 on the right and 0.9 on the left. There were abnormal monophasic waveforms on the left.  MEDICAL ISSUES: Left hip claudication  The patient complains of continued left hip claudication after ambulating 75 yards. He is unable to tolerate cilostazol. The patient would like to pursue angiography for further investigation of his symptoms. We'll plan for angiography via right femoral approach with possible intervention on 04/22/2016. He knows that the patency of any intervention may be limited. He was counseled on maximal medical management including hypertension control, lipid control, healthy eating habits, exercise and smoking cessation.   Virgina Jock, PA-C [facility] [place]     I agree with the above.  I have seen and evaluated the patient.  He suffers from left leg claudication at approximately 75 yards.  He is having difficulty tolerating this.  He did not tolerate cilostazol.  We stressed the importance of medical management to include a statin for hypercholesterolemia, good blood pressure control, smoking cessation, and taking a daily aspirin.  We have decided to proceed with angiography and intervention if indicated.  This will be done on 04/22/2016.  This will be done through a right femoral approach.  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  The risks and benefits were discussed with the patient including the risk of bleeding, embolization and recurrence should intervention be performed.  Annamarie Major

## 2016-04-22 ENCOUNTER — Telehealth: Payer: Self-pay | Admitting: Surgery

## 2016-04-22 ENCOUNTER — Ambulatory Visit (HOSPITAL_COMMUNITY)
Admission: RE | Admit: 2016-04-22 | Discharge: 2016-04-22 | Disposition: A | Discharge status: Home / Self Care | Payer: 59 | Source: Ambulatory Visit | Attending: Surgery | Admitting: Surgery

## 2016-04-22 ENCOUNTER — Encounter (HOSPITAL_COMMUNITY): Payer: Self-pay | Admitting: Surgery

## 2016-04-22 ENCOUNTER — Encounter (HOSPITAL_COMMUNITY)
Admission: RE | Disposition: A | Discharge status: Home / Self Care | Payer: Self-pay | Source: Ambulatory Visit | Attending: Surgery

## 2016-04-22 ENCOUNTER — Other Ambulatory Visit: Payer: Self-pay | Admitting: *Deleted

## 2016-04-22 DIAGNOSIS — I739 Peripheral vascular disease, unspecified: Secondary | ICD-10-CM

## 2016-04-22 DIAGNOSIS — I70212 Atherosclerosis of native arteries of extremities with intermittent claudication, left leg: Secondary | ICD-10-CM | POA: Insufficient documentation

## 2016-04-22 DIAGNOSIS — Z7982 Long term (current) use of aspirin: Secondary | ICD-10-CM | POA: Diagnosis not present

## 2016-04-22 DIAGNOSIS — Z9862 Peripheral vascular angioplasty status: Secondary | ICD-10-CM

## 2016-04-22 DIAGNOSIS — Z800 Family history of malignant neoplasm of digestive organs: Secondary | ICD-10-CM | POA: Insufficient documentation

## 2016-04-22 DIAGNOSIS — F17210 Nicotine dependence, cigarettes, uncomplicated: Secondary | ICD-10-CM | POA: Insufficient documentation

## 2016-04-22 DIAGNOSIS — I10 Essential (primary) hypertension: Secondary | ICD-10-CM | POA: Insufficient documentation

## 2016-04-22 DIAGNOSIS — E7800 Pure hypercholesterolemia, unspecified: Secondary | ICD-10-CM | POA: Insufficient documentation

## 2016-04-22 HISTORY — PX: PERIPHERAL VASCULAR CATHETERIZATION: SHX172C

## 2016-04-22 LAB — POCT ACTIVATED CLOTTING TIME
ACTIVATED CLOTTING TIME: 186 s
ACTIVATED CLOTTING TIME: 164 s
Activated Clotting Time: 235 seconds
Activated Clotting Time: 263 seconds

## 2016-04-22 SURGERY — ABDOMINAL AORTOGRAM W/LOWER EXTREMITY
Anesthesia: LOCAL

## 2016-04-22 MED ORDER — OXYCODONE HCL 5 MG PO TABS: 5.0000 mg | ORAL_TABLET | ORAL | Status: DC | PRN

## 2016-04-22 MED ORDER — OXYCODONE-ACETAMINOPHEN 5-325 MG PO TABS: 2.0000 | ORAL_TABLET | Freq: Once | ORAL | Status: DC

## 2016-04-22 MED ORDER — MIDAZOLAM HCL 2 MG/2ML IJ SOLN
INTRAMUSCULAR | Status: AC
  Filled 2016-04-22: qty 2

## 2016-04-22 MED ORDER — HEPARIN (PORCINE) IN NACL 2-0.9 UNIT/ML-% IJ SOLN
INTRAMUSCULAR | Status: AC
  Filled 2016-04-22: qty 1000

## 2016-04-22 MED ORDER — VERAPAMIL HCL 2.5 MG/ML IV SOLN
INTRAVENOUS | Status: AC
  Filled 2016-04-22: qty 2

## 2016-04-22 MED ORDER — VIPERSLIDE LUBRICANT OPTIME
TOPICAL | Status: DC | PRN
  Administered 2016-04-22: 11:00:00 via SURGICAL_CAVITY

## 2016-04-22 MED ORDER — GUAIFENESIN-DM 100-10 MG/5ML PO SYRP: 15.0000 mL | ORAL_SOLUTION | ORAL | Status: DC | PRN

## 2016-04-22 MED ORDER — DOCUSATE SODIUM 100 MG PO CAPS: 100.0000 mg | ORAL_CAPSULE | Freq: Every day | ORAL | Status: DC

## 2016-04-22 MED ORDER — SODIUM CHLORIDE 0.9 % IV SOLN: 1.0000 mL/kg/h | INTRAVENOUS | Status: DC

## 2016-04-22 MED ORDER — LABETALOL HCL 5 MG/ML IV SOLN: 10.0000 mg | INTRAVENOUS | Status: DC | PRN

## 2016-04-22 MED ORDER — MIDAZOLAM HCL 2 MG/2ML IJ SOLN
INTRAMUSCULAR | Status: DC | PRN
Start: 2016-04-22 — End: 2016-04-22
  Administered 2016-04-22 (×2): 1 mg via INTRAVENOUS
  Administered 2016-04-22: 2 mg via INTRAVENOUS

## 2016-04-22 MED ORDER — HEPARIN (PORCINE) IN NACL 2-0.9 UNIT/ML-% IJ SOLN
INTRAMUSCULAR | Status: DC | PRN
  Administered 2016-04-22: 1000 mL via INTRA_ARTERIAL

## 2016-04-22 MED ORDER — HEPARIN SODIUM (PORCINE) 1000 UNIT/ML IJ SOLN
INTRAMUSCULAR | Status: DC | PRN
  Administered 2016-04-22: 1000 [IU] via INTRAVENOUS
  Administered 2016-04-22: 9000 [IU] via INTRAVENOUS

## 2016-04-22 MED ORDER — LIDOCAINE HCL (PF) 1 % IJ SOLN
INTRAMUSCULAR | Status: AC
  Filled 2016-04-22: qty 30

## 2016-04-22 MED ORDER — FENTANYL CITRATE (PF) 100 MCG/2ML IJ SOLN
INTRAMUSCULAR | Status: DC | PRN
  Administered 2016-04-22: 50 ug via INTRAVENOUS
  Administered 2016-04-22 (×2): 25 ug via INTRAVENOUS

## 2016-04-22 MED ORDER — OXYCODONE-ACETAMINOPHEN 5-325 MG PO TABS
ORAL_TABLET | ORAL | Status: DC
Start: 2016-04-22 — End: 2016-04-22
  Filled 2016-04-22: qty 2

## 2016-04-22 MED ORDER — ACETAMINOPHEN 325 MG RE SUPP: 325.0000 mg | RECTAL | Status: DC | PRN

## 2016-04-22 MED ORDER — PHENOL 1.4 % MT LIQD: 1.0000 | OROMUCOSAL | Status: DC | PRN

## 2016-04-22 MED ORDER — ALUM & MAG HYDROXIDE-SIMETH 200-200-20 MG/5ML PO SUSP: 15.0000 mL | ORAL | Status: DC | PRN

## 2016-04-22 MED ORDER — HEPARIN SODIUM (PORCINE) 1000 UNIT/ML IJ SOLN
INTRAMUSCULAR | Status: AC
  Filled 2016-04-22: qty 1

## 2016-04-22 MED ORDER — ONDANSETRON HCL 4 MG/2ML IJ SOLN: 4.0000 mg | Freq: Four times a day (QID) | INTRAMUSCULAR | Status: DC | PRN

## 2016-04-22 MED ORDER — NITROGLYCERIN IN D5W 200-5 MCG/ML-% IV SOLN
INTRAVENOUS | Status: AC
  Filled 2016-04-22: qty 250

## 2016-04-22 MED ORDER — IODIXANOL 320 MG/ML IV SOLN
INTRAVENOUS | Status: DC | PRN
  Administered 2016-04-22: 170 mL via INTRAVENOUS

## 2016-04-22 MED ORDER — METOPROLOL TARTRATE 5 MG/5ML IV SOLN: 2.0000 mg | INTRAVENOUS | Status: DC | PRN

## 2016-04-22 MED ORDER — HYDRALAZINE HCL 20 MG/ML IJ SOLN
5.0000 mg | INTRAMUSCULAR | Status: DC | PRN
  Administered 2016-04-22: 5 mg via INTRAVENOUS

## 2016-04-22 MED ORDER — FENTANYL CITRATE (PF) 100 MCG/2ML IJ SOLN
INTRAMUSCULAR | Status: AC
  Filled 2016-04-22: qty 2

## 2016-04-22 MED ORDER — ACETAMINOPHEN 325 MG PO TABS: 325.0000 mg | ORAL_TABLET | ORAL | Status: DC | PRN

## 2016-04-22 MED ORDER — MORPHINE SULFATE (PF) 10 MG/ML IV SOLN
2.0000 mg | INTRAVENOUS | Status: DC | PRN
  Administered 2016-04-22: 2 mg via INTRAVENOUS

## 2016-04-22 MED ORDER — SODIUM CHLORIDE 0.9 % IV SOLN
INTRAVENOUS | Status: DC
  Administered 2016-04-22: 08:00:00 via INTRAVENOUS

## 2016-04-22 MED ORDER — HYDRALAZINE HCL 20 MG/ML IJ SOLN
INTRAMUSCULAR | Status: AC
  Filled 2016-04-22: qty 1

## 2016-04-22 MED ORDER — LIDOCAINE HCL (PF) 1 % IJ SOLN
INTRAMUSCULAR | Status: DC | PRN
Start: 2016-04-22 — End: 2016-04-22
  Administered 2016-04-22: 15 mL via SUBCUTANEOUS

## 2016-04-22 MED ORDER — MORPHINE SULFATE (PF) 2 MG/ML IV SOLN
INTRAVENOUS | Status: AC
  Filled 2016-04-22: qty 1

## 2016-04-22 SURGICAL SUPPLY — 23 items
BALLN LUTONIX 6X150X130 (BALLOONS) ×3
BALLN LUTONIX DCB 6X80X130 (BALLOONS) ×3
BALLOON LUTONIX 6X150X130 (BALLOONS) ×2 IMPLANT
BALLOON LUTONIX DCB 6X80X130 (BALLOONS) ×2 IMPLANT
BUR JETSTREAM XC 2.1/3.0 (BURR) ×2 IMPLANT
BURR JETSTREAM XC 2.1/3.0 (BURR) ×3
CATH OMNI FLUSH 5F 65CM (CATHETERS) ×3 IMPLANT
CATH QUICKCROSS SUPP .035X90CM (MICROCATHETER) ×3 IMPLANT
DEVICE EMBOSHIELD NAV6 4.0-7.0 (WIRE) ×6 IMPLANT
GUIDEWIRE ANGLED .035X150CM (WIRE) ×3 IMPLANT
KIT ENCORE 26 ADVANTAGE (KITS) ×3 IMPLANT
KIT MICROINTRODUCER STIFF 5F (SHEATH) ×3 IMPLANT
KIT PV (KITS) ×3 IMPLANT
SHEATH PINNACLE 5F 10CM (SHEATH) ×3 IMPLANT
SHEATH PINNACLE ST 7F 45CM (SHEATH) ×3 IMPLANT
SHIELD RADPAD SCOOP 12X17 (MISCELLANEOUS) ×3 IMPLANT
SYR MEDRAD MARK V 150ML (SYRINGE) ×3 IMPLANT
TAPE RADIOPAQUE TURBO (MISCELLANEOUS) ×3 IMPLANT
TRANSDUCER W/STOPCOCK (MISCELLANEOUS) ×3 IMPLANT
TRAY PV CATH (CUSTOM PROCEDURE TRAY) ×3 IMPLANT
WIRE BAREWIRE WORK .014X315CM (WIRE) ×3 IMPLANT
WIRE BENTSON .035X145CM (WIRE) ×3 IMPLANT
WIRE VIPER ADVANCE .017X335CM (WIRE) ×3 IMPLANT

## 2016-04-22 NOTE — Discharge Instructions (Signed)

## 2016-04-22 NOTE — Op Note (Signed)
Patient name: Mark Stevenson MRN: [id number] DOB: 1956-03-30 Sex: male  04/22/2016 Pre-operative Diagnosis: left leg claudication Post-operative diagnosis:  Same Surgeon:  Annamarie Major Procedure Performed:  1.  Ultrasound-guided access, right femoral artery  2.  Abdominal aortogram  3.  Bilateral lower extremity runoff  4.  Atherectomy, left superficial femoral artery  5.  Drug coated balloon angioplasty, left superficial femoral artery  6.  Conscious sedation (113 minutes)    Indications:  The patient suffers from left hip and calf claudication.  He failed conservative therapy.  He is here today for angiogram and possible intervention  Procedure:  The patient was identified in the holding area and taken to room 8.  The patient was then placed supine on the table and prepped and draped in the usual sterile fashion.  A time out was called.  Conscious sedation was performed with the use of IV fentanyl and Versed in a continuous position and circulating nurse monitoring.  Heart rate and blood pressure and oxygen saturations were continuously monitored Ultrasound was used to evaluate the right common femoral artery.  It was patent .  A digital ultrasound image was acquired.  A micropuncture needle was used to access the right common femoral artery under ultrasound guidance.  An 018 wire was advanced without resistance and a micropuncture sheath was placed.  The 018 wire was removed and a benson wire was placed.  The micropuncture sheath was exchanged for a 5 french sheath.  An omniflush catheter was advanced over the wire to the level of L-1.  An abdominal angiogram was obtained.  Next, using the omniflush catheter and a benson wire, the aortic bifurcation was crossed and the catheter was placed into theleft external iliac artery and left runoff was obtained.  right runoff was performed via retrograde sheath injections.  Findings:   Aortogram:  No significant renal artery stenosis was  identified.  Bilateral common and external iliac arteries are widely patent.  There is a high-grade stenosis at the origin of the left internal iliac artery  Right Lower Extremity:  The right common femoral and profunda femoral artery are widely patent.  There is mild to moderate stenosis throughout the superficial femoral artery.  There is two-vessel runoff via the posterior tibial and peroneal artery  Left Lower Extremity:  Left common femoral profunda femoral artery widely patent.  Proximally there is a lesion with about 70% stenosis.  In the midportion there is an 80% stenotic lesion.  In the adductor canal there is severely calcified plaque with moderate to severe stenosis.  There is three-vessel runoff.  Intervention:  After the above images were acquired the decision was made to proceed with intervention.  A 7 French sheath was advanced into the left external iliac artery.  Using a Glidewire and a quick cross catheter the lesions were successfully crossed.  I used a large NAV-6 filter.  I perform atherectomy of the superficial femoral artery in the proximal third as well as in the adductor canal jetstream 2.1 30 device.  This was done under 2 passes with blades down initially and then the blades up.  After this was done the lesion was further treated with a drug coated balloon.  A Lutonix 6 x 50 and 6 x 80 balloon's were used and taken to nominal pressure for 2 minutes.  Follow-up imaging revealed excellent result with no change in runoff.  Catheters and wires were removed.  The patient taken the holding area for sheath pull squamous  profile corrects.  Impression:  #1  multifocal lesions within the left superficial femoral artery.  The most significant lesion was 80%.  All lesions were treated with jetstream atherectomy and 6 mm drug coated balloon angioplasty with stenosis residual less than 10%.  #2  high-grade left hypogastric artery stenosis  #3  mild to moderate stenosis throughout the right  superficial femoral artery with two-vessel runoff    V. Annamarie Major, M.D. [facility] [place] Office: [phone] Pager:  [phone]

## 2016-04-22 NOTE — Interval H&P Note (Signed)
History and Physical Interval Note:  04/22/2016 9:40 AM  Mark Stevenson  has presented today for surgery, with the diagnosis of PVD, Claudication   The various methods of treatment have been discussed with the patient and family. After consideration of risks, benefits and other options for treatment, the patient has consented to  Procedure(s): Abdominal Aortogram w/ bilateral Lower Extremity Runoff (N/A) as a surgical intervention .  The patient's history has been reviewed, patient examined, no change in status, stable for surgery.  I have reviewed the patient's chart and labs.  Questions were answered to the patient's satisfaction.     Annamarie Major

## 2016-04-22 NOTE — Telephone Encounter (Signed)
Sched appt 8/28, labs at 9:00, MD at 10:30. Lm on hm# to inform pt of appt.

## 2016-04-22 NOTE — H&P (View-Only) (Signed)
[facility] [place]  Patient name: Mark Stevenson MRN: [id number] DOB: Apr 27, 1956 Sex: male  REASON FOR VISIT: Follow-up  HPI: Mark Stevenson is a 60 y.o. male who presents for continued follow-up of his left hip claudication. He states that he has left hip claudication after ambulating approximately 75-100 yards. His pain is better with rest. He was last seen 3 months ago and was prescribed cilostazol. He says that he was unable to tolerate the side effects of the medicine. He continues to smoke. He was advised to start taking a baby aspirin but has not done so.   He has a significant orthopedic history including left hip total arthroplasty, repair of a knee fracture, and lumbar disc surgery.  He has complained of 3 episodes in the past few months regarding his vision "closing in" while driving. This is associated with bilateral blurriness. He denies any loss of vision. These episodes last about 35-45 seconds. He denies any history of TIA or stroke. He is also complained of dizziness.  He takes a statin for hypercholesterolemia.  Past Medical History  Diagnosis Date  . Hypertension     Family History  Problem Relation Age of Onset  . Pancreatic cancer Neg Hx   . Stomach cancer Neg Hx   . Colon cancer Son   . Liver cancer Son     SOCIAL HISTORY: Social History  Substance Use Topics  . Smoking status: Current Every Day Smoker -- 1.50 packs/day    Types: Cigarettes  . Smokeless tobacco: Never Used  . Alcohol Use: 6.0 oz/week    10 Shots of liquor per week    No Known Allergies  Current Outpatient Prescriptions  Medication Sig Dispense Refill  . Cholecalciferol (VITAMIN D3) 2000 UNITS TABS Take by mouth.    . cilostazol (PLETAL) 100 MG tablet Take 1 tablet (100 mg total) by mouth 2 (two) times daily before a meal. 60 tablet 11  . gabapentin (NEURONTIN) 600 MG tablet Take 600 mg by mouth 3 (three) times daily.    Marland Kitchen losartan (COZAAR) 100 MG  tablet Take 100 mg by mouth daily.    Marland Kitchen LOVASTATIN PO Take 20 mg by mouth daily.     Marland Kitchen triamterene-hydrochlorothiazide (MAXZIDE-25) 37.5-25 MG per tablet Take 1 tablet by mouth daily.     No current facility-administered medications for this visit.    REVIEW OF SYSTEMS:  [X]  denotes positive finding, [ ]  denotes negative finding Cardiac  Comments:  Chest pain or chest pressure:    Shortness of breath upon exertion: x   Short of breath when lying flat:    Irregular heart rhythm:        Vascular    Pain in calf, thigh, or hip brought on by ambulation: x Left hip  Pain in feet at night that wakes you up from your sleep:     Blood clot in your veins:    Leg swelling:         Pulmonary    Oxygen at home:    Productive cough:  x   Wheezing:  x       Neurologic    Sudden weakness in arms or legs:     Sudden numbness in arms or legs:     Sudden onset of difficulty speaking or slurred speech:    Temporary loss of vision in one eye:     Problems with dizziness:  x       Gastrointestinal  Blood in stool:     Vomited blood:         Genitourinary    Burning when urinating:     Blood in urine:        Psychiatric    Stevenson depression:         Hematologic    Bleeding problems:    Problems with blood clotting too easily:        Skin    Rashes or ulcers:        Constitutional    Fever or chills:      PHYSICAL EXAM: Filed Vitals:   04/14/16 1508 04/14/16 1511  BP: 150/86 142/88  Pulse: 77   Temp: 98.4 F (36.9 C)   TempSrc: Oral   Resp: 18   Height: 5\' 11"  (1.803 m)   Weight: 216 lb (97.977 kg)   SpO2: 96%     GENERAL: The patient is a well-nourished male, in no acute distress. The vital signs are documented above. CARDIAC: There is a regular rate and rhythm. No carotid bruits. VASCULAR: 2+ right femoral pulse. Nonpalpable left femoral pulse. Nonpalpable popliteal pulses. Palpable pedal pulses bilaterally. PULMONARY: There is good air exchange bilaterally without  wheezing or rales. MUSCULOSKELETAL: There are no Stevenson deformities or cyanosis. NEUROLOGIC: No focal weakness or paresthesias are detected. SKIN: There are no ulcers or rashes noted. PSYCHIATRIC: The patient has a normal affect.  DATA:  Previous ABIs from April 2017 revealed an 0.1 on the right and 0.9 on the left. There were abnormal monophasic waveforms on the left.  MEDICAL ISSUES: Left hip claudication  The patient complains of continued left hip claudication after ambulating 75 yards. He is unable to tolerate cilostazol. The patient would like to pursue angiography for further investigation of his symptoms. We'll plan for angiography via right femoral approach with possible intervention on 04/22/2016. He knows that the patency of any intervention may be limited. He was counseled on maximal medical management including hypertension control, lipid control, healthy eating habits, exercise and smoking cessation.   Mark Jock, PA-C [facility] [place]     I agree with the above.  I have seen and evaluated the patient.  He suffers from left leg claudication at approximately 75 yards.  He is having difficulty tolerating this.  He did not tolerate cilostazol.  We stressed the importance of medical management to include a statin for hypercholesterolemia, good blood pressure control, smoking cessation, and taking a daily aspirin.  We have decided to proceed with angiography and intervention if indicated.  This will be done on 04/22/2016.  This will be done through a right femoral approach.  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  The risks and benefits were discussed with the patient including the risk of bleeding, embolization and recurrence should intervention be performed.  Mark Stevenson

## 2016-04-22 NOTE — Progress Notes (Addendum)
Site area: Right groin 7 french long arterial sheath was removed  Site Prior to Removal:  Level 0  Pressure Applied For 30 MINUTES    Bedrest Beginning at 1350p[  Manual:   Yes.    Patient Status During Pull:  stable  Post Pull Groin Site:  Level 0  Post Pull Instructions Given:  Yes.    Post Pull Pulses Present:  Yes.    Dressing Applied:  Yes.    Comments:  VS remain stable .  BP 160/93  Hydralazine 5 mg effective.  Pressure dressing applied to site.  Pain med effective for back pain

## 2016-04-22 NOTE — Telephone Encounter (Signed)
-----   Message from Mena Goes, RN sent at 04/22/2016 12:03 PM EDT ----- Regarding: 1 month f/u with ABI and LE duplex   ----- Message ----- From: Serafina Mitchell, MD Sent: 04/22/2016  11:55 AM To: [facility] Charge Pool  04/22/2016:  Surgeon:  Annamarie Major Procedure Performed:  1.  Ultrasound-guided access, right femoral artery  2.  Abdominal aortogram  3.  Bilateral lower extremity runoff  4.  Atherectomy, left superficial femoral artery  5.  Drug coated balloon angioplasty, left superficial femoral artery  6.  Conscious sedation (113 minutes)   Follow-up one month with ABI and duplex of the left leg

## 2016-04-23 LAB — POCT I-STAT, CHEM 8
BUN: 14 mg/dL (ref 6–20)
CALCIUM ION: 1.13 mmol/L (ref 1.12–1.23)
CREATININE: 0.7 mg/dL (ref 0.61–1.24)
Chloride: 104 mmol/L (ref 101–111)
GLUCOSE: 90 mg/dL (ref 65–99)
HCT: 46 % (ref 39.0–52.0)
HEMOGLOBIN: 15.6 g/dL (ref 13.0–17.0)
Potassium: 3.8 mmol/L (ref 3.5–5.1)
Sodium: 140 mmol/L (ref 135–145)
TCO2: 24 mmol/L (ref 0–100)

## 2016-05-21 ENCOUNTER — Encounter: Payer: Self-pay | Admitting: Surgery

## 2016-05-26 ENCOUNTER — Ambulatory Visit (INDEPENDENT_AMBULATORY_CARE_PROVIDER_SITE_OTHER): Payer: 59 | Admitting: Surgery

## 2016-05-26 ENCOUNTER — Ambulatory Visit (HOSPITAL_COMMUNITY)
Admission: RE | Admit: 2016-05-26 | Discharge: 2016-05-26 | Disposition: A | Discharge status: Home / Self Care | Payer: 59 | Source: Ambulatory Visit | Attending: Surgery | Admitting: Surgery

## 2016-05-26 ENCOUNTER — Encounter: Payer: Self-pay | Admitting: Surgery

## 2016-05-26 ENCOUNTER — Ambulatory Visit (INDEPENDENT_AMBULATORY_CARE_PROVIDER_SITE_OTHER)
Admit: 2016-05-26 | Discharge: 2016-05-26 | Disposition: A | Discharge status: Home / Self Care | Payer: 59 | Attending: Surgery | Admitting: Surgery

## 2016-05-26 VITALS — BP 164/104 | HR 80 | Temp 98.3°F | Resp 16 | Ht 71.0 in | Wt 214.0 lb

## 2016-05-26 DIAGNOSIS — I70212 Atherosclerosis of native arteries of extremities with intermittent claudication, left leg: Secondary | ICD-10-CM

## 2016-05-26 DIAGNOSIS — E785 Hyperlipidemia, unspecified: Secondary | ICD-10-CM | POA: Insufficient documentation

## 2016-05-26 DIAGNOSIS — Z720 Tobacco use: Secondary | ICD-10-CM | POA: Diagnosis not present

## 2016-05-26 DIAGNOSIS — Z9862 Peripheral vascular angioplasty status: Secondary | ICD-10-CM | POA: Diagnosis not present

## 2016-05-26 DIAGNOSIS — I10 Essential (primary) hypertension: Secondary | ICD-10-CM | POA: Diagnosis not present

## 2016-05-26 DIAGNOSIS — I739 Peripheral vascular disease, unspecified: Secondary | ICD-10-CM | POA: Diagnosis not present

## 2016-05-26 DIAGNOSIS — R0989 Other specified symptoms and signs involving the circulatory and respiratory systems: Secondary | ICD-10-CM | POA: Diagnosis present

## 2016-05-26 LAB — VAS US LOWER EXTREMITY ARTERIAL DUPLEX
RIGHT ANT DIST TIBAL SYS PSV: 50 cm/s
RSFDPSV: -72 cm/s
RSFMPSV: -105 cm/s
RSFPPSV: -86 cm/s
RTIBDISTSYS: 50 cm/s
Right popliteal dist sys PSV: -53 cm/s
Right popliteal prox sys PSV: 49 cm/s

## 2016-05-26 NOTE — Progress Notes (Signed)
[facility] [place]  Patient name: Mark Stevenson MRN: [id number] DOB: Oct 06, 1955 Sex: male  REASON FOR VISIT: follow up  HPI: Mark Stevenson is a 60 y.o. male returns today for follow-up of his left leg claudication.  He states that he gets hip pain when inflating approximately 75-100 yards.  It is better with rest.  We tried to treat him with cilostazol, however he could not tolerate the side effects.  Therefore he underwent angiography on 04/22/2016.  No significant common or external iliac artery stenosis was identified.  He did have left internal iliac artery stenosis and left superficial femoral artery stenosis.  The superficial femoral artery lesions were treated with atherectomy (jetstream) and drug coated balloon angioplasty.  The patient is back today stating that he still gets hip pain with ambulation.  He denies rest pain or ulcers.    Past Medical History:  Diagnosis Date  . Hypertension     Family History  Problem Relation Age of Onset  . Pancreatic cancer Neg Hx   . Stomach cancer Neg Hx   . Colon cancer Son   . Liver cancer Son     SOCIAL HISTORY: Social History  Substance Use Topics  . Smoking status: Current Every Day Smoker    Packs/day: 1.50    Types: Cigarettes  . Smokeless tobacco: Never Used  . Alcohol use 6.0 oz/week    10 Shots of liquor per week    No Known Allergies  Current Outpatient Prescriptions  Medication Sig Dispense Refill  . ADVAIR DISKUS 250-50 MCG/DOSE AEPB Inhale 1 puff into the lungs 2 (two) times daily.     . Cholecalciferol (VITAMIN D3) 2000 UNITS TABS Take 1 capsule by mouth daily.     . cilostazol (PLETAL) 100 MG tablet Take 1 tablet (100 mg total) by mouth 2 (two) times daily before a meal. (Patient not taking: Reported on 04/15/2016) 60 tablet 11  . gabapentin (NEURONTIN) 600 MG tablet Take 600 mg by mouth 3 (three) times daily.    Marland Kitchen losartan (COZAAR) 100 MG  tablet Take 100 mg by mouth daily.    Marland Kitchen lovastatin (MEVACOR) 20 MG tablet Take 20 mg by mouth at bedtime.    . triamterene-hydrochlorothiazide (MAXZIDE-25) 37.5-25 MG per tablet Take 1 tablet by mouth daily.     No current facility-administered medications for this visit.     REVIEW OF SYSTEMS:  [X]  denotes positive finding, [ ]  denotes negative finding Cardiac  Comments:  Chest pain or chest pressure:    Shortness of breath upon exertion:    Short of breath when lying flat:    Irregular heart rhythm:        Vascular    Pain in calf, thigh, or hip brought on by ambulation:    Pain in feet at night that wakes you up from your sleep:     Blood clot in your veins:    Leg swelling:         Pulmonary    Oxygen at home:    Productive cough:     Wheezing:         Neurologic    Sudden weakness in arms or legs:     Sudden numbness in arms or legs:     Sudden onset of difficulty speaking or slurred speech:    Temporary loss of vision in one eye:     Problems with dizziness:         Gastrointestinal  Blood in stool:     Vomited blood:         Genitourinary    Burning when urinating:     Blood in urine:        Psychiatric    Major depression:         Hematologic    Bleeding problems:    Problems with blood clotting too easily:        Skin    Rashes or ulcers:        Constitutional    Fever or chills:      PHYSICAL EXAM: Vitals:   05/26/16 1106 05/26/16 1110  BP: (!) 160/104 (!) 164/104  Pulse: 78 80  Resp: 16   Temp: 98.3 F (36.8 C)   TempSrc: Oral   SpO2: 99%   Weight: 214 lb (97.1 kg)   Height: 5\' 11"  (1.803 m)     GENERAL: The patient is a well-nourished male, in no acute distress. The vital signs are documented above. CARDIAC: There is a regular rate and rhythm.  VASCULAR: palpable left pedal pulse PULMONARY: There is good air exchange bilaterally without wheezing or rales. MUSCULOSKELETAL: There are no major deformities or cyanosis. NEUROLOGIC: No  focal weakness or paresthesias are detected. SKIN: There are no ulcers or rashes noted. PSYCHIATRIC: The patient has a normal affect.  DATA:  Ultrasound today shows ABI of 1.0 on the left with triphasic waveforms.  Duplex shows a widely patent intervention.  MEDICAL ISSUES: Left leg claudication: The patient has normal blood flow now on the left with an ABI of 1 and triphasic waveforms.  Unfortunately he is still having hip pain with ambulation.  He does have a history of hip replacement.  I have asked him to further discuss this with Dr. Alvan Dame as I do not think that this is a vascular problem.  He is going to follow up in 6 months for surveillance imaging.    Annamarie Major, MD [facility] [phone] Pager [phone]

## 2016-05-27 ENCOUNTER — Other Ambulatory Visit: Payer: Self-pay | Admitting: *Deleted

## 2016-05-27 DIAGNOSIS — I739 Peripheral vascular disease, unspecified: Secondary | ICD-10-CM

## 2016-07-15 ENCOUNTER — Other Ambulatory Visit: Payer: Self-pay | Admitting: Orthopedic Surgery

## 2016-07-15 DIAGNOSIS — M25552 Pain in left hip: Secondary | ICD-10-CM

## 2016-07-25 ENCOUNTER — Encounter (HOSPITAL_COMMUNITY)
Admission: RE | Admit: 2016-07-25 | Discharge: 2016-07-25 | Disposition: A | Discharge status: Home / Self Care | Payer: 59 | Source: Ambulatory Visit | Attending: Orthopedic Surgery | Admitting: Orthopedic Surgery

## 2016-07-25 DIAGNOSIS — M25552 Pain in left hip: Secondary | ICD-10-CM | POA: Diagnosis present

## 2016-07-25 MED ORDER — TECHNETIUM TC 99M MEDRONATE IV KIT
22.0000 | PACK | Freq: Once | INTRAVENOUS | Status: AC | PRN
  Administered 2016-07-25: 22 via INTRAVENOUS

## 2016-07-29 ENCOUNTER — Encounter: Payer: Self-pay | Admitting: *Deleted

## 2016-08-18 ENCOUNTER — Other Ambulatory Visit: Payer: Self-pay | Admitting: Specialist

## 2016-08-18 DIAGNOSIS — M48061 Spinal stenosis, lumbar region without neurogenic claudication: Secondary | ICD-10-CM

## 2016-08-19 ENCOUNTER — Encounter: Payer: Self-pay | Admitting: Gastroenterology

## 2016-09-08 ENCOUNTER — Ambulatory Visit
Admission: RE | Admit: 2016-09-08 | Discharge: 2016-09-08 | Disposition: A | Discharge status: Home / Self Care | Payer: 59 | Source: Ambulatory Visit | Attending: Specialist | Admitting: Specialist

## 2016-09-08 DIAGNOSIS — M48061 Spinal stenosis, lumbar region without neurogenic claudication: Secondary | ICD-10-CM

## 2016-09-08 MED ORDER — DIAZEPAM 5 MG PO TABS
10.0000 mg | ORAL_TABLET | Freq: Once | ORAL | Status: AC
  Administered 2016-09-08: 10 mg via ORAL

## 2016-09-08 MED ORDER — IOPAMIDOL (ISOVUE-M 200) INJECTION 41%
15.0000 mL | Freq: Once | INTRAMUSCULAR | Status: AC
  Administered 2016-09-08: 15 mL via INTRATHECAL

## 2016-09-08 MED ORDER — ONDANSETRON HCL 4 MG/2ML IJ SOLN: 4.0000 mg | Freq: Four times a day (QID) | INTRAMUSCULAR | Status: DC | PRN

## 2016-09-08 NOTE — Discharge Instructions (Signed)
Myelogram Discharge Instructions  1. Go home and rest quietly for the next 24 hours.  It is important to lie flat for the next 24 hours.  Get up only to go to the restroom.  You may lie in the bed or on a couch on your back, your stomach, your left side or your right side.  You may have one pillow under your head.  You may have pillows between your knees while you are on your side or under your knees while you are on your back.  2. DO NOT drive today.  Recline the seat as far back as it will go, while still wearing your seat belt, on the way home.  3. You may get up to go to the bathroom as needed.  You may sit up for 10 minutes to eat.  You may resume your normal diet and medications unless otherwise indicated.  Drink lots of extra fluids today and tomorrow.  4. The incidence of headache, nausea, or vomiting is about 5% (one in 20 patients).  If you develop a headache, lie flat and drink plenty of fluids until the headache goes away.  Caffeinated beverages may be helpful.  If you develop severe nausea and vomiting or a headache that does not go away with flat bed rest, call [phone].  5. You may resume normal activities after your 24 hours of bed rest is over; however, do not exert yourself strongly or do any heavy lifting tomorrow. If when you get up you have a headache when standing, go back to bed and force fluids for another 24 hours.  6. Call your physician for a follow-up appointment.  The results of your myelogram will be sent directly to your physician by the following day.  7. If you have any questions or if complications develop after you arrive home, please call [phone].  Discharge instructions have been explained to the patient.  The patient, or the person responsible for the patient, fully understands these instructions.      MAY RESUME PLETAL TODAY.

## 2016-09-08 NOTE — Progress Notes (Signed)
Patient states he has been off Pletal for "over a month."  jkl

## 2016-09-17 ENCOUNTER — Ambulatory Visit: Payer: Self-pay | Admitting: Orthopedic Surgery

## 2016-10-07 ENCOUNTER — Ambulatory Visit: Payer: Self-pay | Admitting: Orthopedic Surgery

## 2016-10-07 NOTE — H&P (Signed)
Mark Stevenson is an 61 y.o. male.   Chief Complaint: back and leg pain HPI: The patient is a 62 year old male who presents today for follow up of their back. The patient is being followed for their low back pain and back symptoms. They are now year(s) out from when symptoms began. Symptoms reported today include: pain. Current treatment includes: activity modification and NSAIDs (Aleve). The following medication has been used for pain control: antiinflammatory medication. The patient presents today following CT/Myelogram.  [name] follows up with CT myelogram results. He still reports left-sided hip pain that radiates down into the thigh. He reports some weakness on this side. His myelogram indicated no instability. He has L1 compression fracture deformity. He has what appears to be a left-sided disc herniation at L3-4 behind the vertebral body of L4, disc herniation, possible scar tissue from previous surgery. There is mass effect upon the L4 nerve root. There is lateral recess stenosis and short pedicles at L4-5 and facet arthropathy at L5-S1.  Calcifications of the aorta is noted as well. [name] reports that he did see a vascular surgeon who did ABIs and balloon angioplasty distal in his leg, but it was cleared proximally and that was performed earlier in the year. He had in that left side an ABI of 0.86 and monophasic areas, triphasic on the right. His symptoms are on the left. He apparently underwent he reports balloon angioplasty on his left. No changes in his proximal symptoms. He did have an epidural at L4-5. He reported perhaps a minimal temporary relief from his symptoms. He had MRI of his lumbar spine demonstrating disc protrusion paracentral to the left at L3-4, facet arthrosis and severe lateral recess stenosis at L3-4, and lateral recess stenosis at L4- 5.  Past Medical History:  Diagnosis Date  . Hypertension     Past Surgical History:  Procedure Laterality Date  .  ankles     tarsal tunnel surgery bilat  . BACK SURGERY    . broken knee cap    . crushed L-4 vertebrae  2008  . KNEE ARTHROSCOPY     x3 bilat  . PERIPHERAL VASCULAR CATHETERIZATION N/A 04/22/2016   Procedure: Abdominal Aortogram w/ bilateral Lower Extremity Runoff;  Surgeon: Serafina Mitchell, MD;  Location: [facility];  Service: Cardiovascular;  Laterality: N/A;  . PERIPHERAL VASCULAR CATHETERIZATION Left 04/22/2016   Procedure: Peripheral Vascular Atherectomy;  Surgeon: Serafina Mitchell, MD;  Location: [facility];  Service: Cardiovascular;  Laterality: Left;  . ruptured disc    . tendons in calves lengthened     plantar fascitis  . TOTAL HIP ARTHROPLASTY  2012    Family History  Problem Relation Age of Onset  . Colon cancer Son   . Liver cancer Son   . Pancreatic cancer Neg Hx   . Stomach cancer Neg Hx    Social History:  reports that he has been smoking Cigarettes.  He has been smoking about 1.50 packs per day. He has never used smokeless tobacco. He reports that he drinks about 6.0 oz of alcohol per week . His drug history is not on file.  Allergies: No Known Allergies   (Not in a hospital admission)  No results found for this or any previous visit (from the past 48 hour(s)). No results found.  Review of Systems  Constitutional: Negative.   HENT: Negative.   Eyes: Negative.   Respiratory: Negative.   Cardiovascular: Negative.   Gastrointestinal:  Negative.   Genitourinary: Negative.   Musculoskeletal: Positive for back pain.  Skin: Negative.   Neurological: Positive for sensory change and focal weakness.  Psychiatric/Behavioral: Negative.     There were no vitals taken for this visit. Physical Exam  Constitutional: He is oriented to person, place, and time. He appears well-developed.  HENT:  Head: Normocephalic.  Eyes: Pupils are equal, round, and reactive to light.  Neck: Normal range of motion.  Cardiovascular: Normal rate.   Respiratory: Effort  normal.  GI: Soft.  Musculoskeletal:  His straight leg raise, buttock and thigh pain. He does have decreased knee reflex on the left compared to the right. It is prominent slightly and 4+/5 quad strength on left compared to the right. Pain with extension and flexion of the lumbar spine. Lumbar spine exam reveals no evidence of soft tissue swelling, deformity or skin ecchymosis. On palpation there is no tenderness of the lumbar spine. No flank pain with percussion. The abdomen is soft and nontender. Nontender over the trochanters. No cellulitis or lymphadenopathy.  Good range of motion of the lumbar spine without associated pain. Straight leg raise is negative. Motor is 5/5 including EHL, tibialis anterior, plantar flexion, quadriceps and hamstrings. Patient is normoreflexic. There is no Babinski or clonus. Sensory exam is intact to light touch. Patient has good distal pulses. No DVT. No pain and normal range of motion without instability of the hips, knees and ankles.  Inspection of the cervical spine reveals a normal lordosis without evidence of paraspinous spasms or soft tissue swelling. Nontender to palpation. Full flexion, full extension, full left and right lateral rotation. Extension combined with lateral flexion does not reproduce pain. Negative impingement sign, negative secondary impingement sign of the shoulders. Negative Tinel's median and ulnar nerves at the elbow. Negative carpal compression test at the wrist. Motor of the upper extremities is 5/5 including biceps, triceps, brachioradialis, wrist flexion, wrist extension, finger flexion, finger extension. Reflexes are normoreflexic. Sensory exam is intact to light touch. There is no Hoffmann sign. Nontender over the thoracic spine.  Neurological: He is alert and oriented to person, place, and time.  Skin: Skin is warm and dry.     Assessment/Plan 1. Left lower extremity radicular pain secondary disc herniation at L3-4, lateral recess  stenosis at L3-4 and L4-5. 2. 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 3. Peripheral vascular disease, ABIs satisfactory. 4. History of tobacco use. 5. Disc degeneration.  We had an extensive discussion concerning current pathology, relevant anatomy, and treatment options, either living with the symptoms or consider surgical intervention. We did discuss his current symptomatology and his multiple studies reviewing his ABIs, his myelogram, his MRI, his symptomatology, his epidurals, his previous studies and the treatment algorithm accordingly. He would like to proceed with surgical intervention. We discussed surgery for leg pain and buttock and hip pain versus back pain, back pain requiring multilevel fusion. He is not interested in that at this point and therefore discussed decompression at L3-4 and L4-5 revisions. With a residual that it would not address back pain, but leg pain. He could have hip pain referred from his facets. However, if the decreased knee reflex and the quad strengthening is definitely at L4 and that is consistent with his studies. We will proceed accordingly.  I had an extensive discussion of the risks and benefits of the lumbar decompression with the patient including bleeding, infection, damage to neurovascular structures, epidural fibrosis, CSF leak requiring repair. We also  discussed increase in pain, adjacent segment disease, recurrent disc herniation, need for future surgery including repeat decompression and/or fusion. We also discussed risks of postoperative hematoma, paralysis, anesthetic complications including DVT, PE, death, cardiopulmonary dysfunction. In addition, the perioperative and postoperative courses were discussed in detail including the rehabilitative time and return to functional activity and work. I provided the patient with an illustrated handout and utilized the appropriate surgical models.  We did  assess increased levels of possible CSF leakage, DVT, PE, etc. and the time to recovery. Any change in the interim, he is to call. We will have him undergo preoperative clearance.  Plan revision microlumbar decompression L3-4, L4-5  BISSELL, Conley Rolls., PA-C for Dr. Tonita Cong 10/07/2016, 2:56 PM

## 2016-10-16 NOTE — Patient Instructions (Addendum)
Mark Stevenson  10/16/2016   Your procedure is scheduled on: Wednesday 10/22/2016  Report to [facility] Main  Entrance take [facility]  elevators to 3rd floor to  [place] at  [place]  AM.  Call this number if you have problems the morning of surgery [phone]   Remember: ONLY 1 PERSON MAY GO WITH YOU TO SHORT STAY TO GET  READY MORNING OF [place].   Do not eat food or drink liquids :After Midnight.     Take these medicines the morning of surgery with A SIP OF WATER: use Advair diskus if needed                                 You may not have any metal on your body including hair pins and              piercings  Do not wear jewelry, make-up, lotions, powders or perfumes, deodorant             Do not wear nail polish.  Do not shave  48 hours prior to surgery.              Men may shave face and neck.   Do not bring valuables to the hospital. [place].  Contacts, dentures or bridgework may not be worn into surgery.  Leave suitcase in the car. After surgery it may be brought to your room.                  Please read over the following fact sheets you were given: _____________________________________________________________________             [facility] - Preparing for Surgery Before surgery, you can play an important role.  Because skin is not sterile, your skin needs to be as free of germs as possible.  You can reduce the number of germs on your skin by washing with CHG (chlorahexidine gluconate) soap before surgery.  CHG is an antiseptic cleaner which kills germs and bonds with the skin to continue killing germs even after washing. Please DO NOT use if you have an allergy to CHG or antibacterial soaps.  If your skin becomes reddened/irritated stop using the CHG and inform your nurse when you arrive at Short Stay. Do not shave (including legs and underarms) for at least 48 hours prior to the  first CHG shower.  You may shave your face/neck. Please follow these instructions carefully:  1.  Shower with CHG Soap the night before surgery and the  morning of Surgery.  2.  If you choose to wash your hair, wash your hair first as usual with your  normal  shampoo.  3.  After you shampoo, rinse your hair and body thoroughly to remove the  shampoo.                           4.  Use CHG as you would any other liquid soap.  You can apply chg directly  to the skin and wash                       Gently with a scrungie or  clean washcloth.  5.  Apply the CHG Soap to your body ONLY FROM THE NECK DOWN.   Do not use on face/ open                           Wound or open sores. Avoid contact with eyes, ears mouth and genitals (private parts).                       Wash face,  Genitals (private parts) with your normal soap.             6.  Wash thoroughly, paying special attention to the area where your surgery  will be performed.  7.  Thoroughly rinse your body with warm water from the neck down.  8.  DO NOT shower/wash with your normal soap after using and rinsing off  the CHG Soap.                9.  Pat yourself dry with a clean towel.            10.  Wear clean pajamas.            11.  Place clean sheets on your bed the night of your first shower and do not  sleep with pets. Day of Surgery : Do not apply any lotions/deodorants the morning of surgery.  Please wear clean clothes to the hospital/surgery center.  FAILURE TO FOLLOW THESE INSTRUCTIONS MAY RESULT IN THE CANCELLATION OF YOUR SURGERY PATIENT SIGNATURE_________________________________  NURSE SIGNATURE__________________________________  ________________________________________________________________________   Mark Stevenson  An incentive spirometer is a tool that can help keep your lungs clear and active. This tool measures how well you are filling your lungs with each breath. Taking long deep breaths may help reverse or decrease  the chance of developing breathing (pulmonary) problems (especially infection) following:  A long period of time when you are unable to move or be active. BEFORE THE PROCEDURE   If the spirometer includes an indicator to show your best effort, your nurse or respiratory therapist will set it to a desired goal.  If possible, sit up straight or lean slightly forward. Try not to slouch.  Hold the incentive spirometer in an upright position. INSTRUCTIONS FOR USE  1. Sit on the edge of your bed if possible, or sit up as far as you can in bed or on a chair. 2. Hold the incentive spirometer in an upright position. 3. Breathe out normally. 4. Place the mouthpiece in your mouth and seal your lips tightly around it. 5. Breathe in slowly and as deeply as possible, raising the piston or the ball toward the top of the column. 6. Hold your breath for 3-5 seconds or for as long as possible. Allow the piston or ball to fall to the bottom of the column. 7. Remove the mouthpiece from your mouth and breathe out normally. 8. Rest for a few seconds and repeat Steps 1 through 7 at least 10 times every 1-2 hours when you are awake. Take your time and take a few normal breaths between deep breaths. 9. The spirometer may include an indicator to show your best effort. Use the indicator as a goal to work toward during each repetition. 10. After each set of 10 deep breaths, practice coughing to be sure your lungs are clear. If you have an incision (the cut made at the time of surgery), support your incision when coughing  by placing a pillow or rolled up towels firmly against it. Once you are able to get out of bed, walk around indoors and cough well. You may stop using the incentive spirometer when instructed by your caregiver.  RISKS AND COMPLICATIONS  Take your time so you do not get dizzy or light-headed.  If you are in pain, you may need to take or ask for pain medication before doing incentive spirometry. It is  harder to take a deep breath if you are having pain. AFTER USE  Rest and breathe slowly and easily.  It can be helpful to keep track of a log of your progress. Your caregiver can provide you with a simple table to help with this. If you are using the spirometer at home, follow these instructions: [place] IF:   You are having difficultly using the spirometer.  You have trouble using the spirometer as often as instructed.  Your pain medication is not giving enough relief while using the spirometer.  You develop fever of 100.5 F (38.1 C) or higher. SEEK IMMEDIATE MEDICAL CARE IF:   You cough up bloody sputum that had not been present before.  You develop fever of 102 F (38.9 C) or greater.  You develop worsening pain at or near the incision site. MAKE SURE YOU:   Understand these instructions.  Will watch your condition.  Will get help right away if you are not doing well or get worse. Document Released: 01/26/2007 Document Revised: 12/08/2011 Document Reviewed: 03/29/2007 ExitCare Patient Information 2014 Exit[employer], [place].   ________________________________________________________________________  WHAT IS A BLOOD TRANSFUSION? Blood Transfusion Information  A transfusion is the replacement of blood or some of its parts. Blood is made up of multiple cells which provide different functions.  Red blood cells carry oxygen and are used for blood loss replacement.  White blood cells fight against infection.  Platelets control bleeding.  Plasma helps clot blood.  Other blood products are available for specialized needs, such as hemophilia or other clotting disorders. BEFORE THE TRANSFUSION  Who gives blood for transfusions?   Healthy volunteers who are fully evaluated to make sure their blood is safe. This is blood bank blood. Transfusion therapy is the safest it has ever been in the practice of medicine. Before blood is taken from a donor, a complete history  is taken to make sure that person has no history of diseases nor engages in risky social behavior (examples are intravenous drug use or sexual activity with multiple partners). The donor's travel history is screened to minimize risk of transmitting infections, such as malaria. The donated blood is tested for signs of infectious diseases, such as HIV and hepatitis. The blood is then tested to be sure it is compatible with you in order to minimize the chance of a transfusion reaction. If you or a relative donates blood, this is often done in anticipation of surgery and is not appropriate for emergency situations. It takes many days to process the donated blood. RISKS AND COMPLICATIONS Although transfusion therapy is very safe and saves many lives, the main dangers of transfusion include:   Getting an infectious disease.  Developing a transfusion reaction. This is an allergic reaction to something in the blood you were given. Every precaution is taken to prevent this. The decision to have a blood transfusion has been considered carefully by your caregiver before blood is given. Blood is not given unless the benefits outweigh the risks. AFTER THE TRANSFUSION  Right after receiving a  blood transfusion, you will usually feel much better and more energetic. This is especially true if your red blood cells have gotten low (anemic). The transfusion raises the level of the red blood cells which carry oxygen, and this usually causes an energy increase.  The nurse administering the transfusion will monitor you carefully for complications. HOME CARE INSTRUCTIONS  No special instructions are needed after a transfusion. You may find your energy is better. Speak with your caregiver about any limitations on activity for underlying diseases you may have. SEEK MEDICAL CARE IF:   Your condition is not improving after your transfusion.  You develop redness or irritation at the intravenous (IV) site. SEEK IMMEDIATE  MEDICAL CARE IF:  Any of the following symptoms occur over the next 12 hours:  Shaking chills.  You have a temperature by mouth above 102 F (38.9 C), not controlled by medicine.  Chest, back, or muscle pain.  People around you feel you are not acting correctly or are confused.  Shortness of breath or difficulty breathing.  Dizziness and fainting.  You get a rash or develop hives.  You have a decrease in urine output.  Your urine turns a dark color or changes to pink, red, or brown. Any of the following symptoms occur over the next 10 days:  You have a temperature by mouth above 102 F (38.9 C), not controlled by medicine.  Shortness of breath.  Weakness after normal activity.  The white part of the eye turns yellow (jaundice).  You have a decrease in the amount of urine or are urinating less often.  Your urine turns a dark color or changes to pink, red, or brown. Document Released: 09/12/2000 Document Revised: 12/08/2011 Document Reviewed: 05/01/2008 [facility] Patient Information 2014 [place], [place].  _______________________________________________________________________

## 2016-10-17 ENCOUNTER — Encounter (HOSPITAL_COMMUNITY)
Admission: RE | Admit: 2016-10-17 | Discharge: 2016-10-17 | Disposition: A | Discharge status: Home / Self Care | Payer: 59 | Source: Ambulatory Visit | Attending: Specialist | Admitting: Specialist

## 2016-10-17 ENCOUNTER — Encounter (HOSPITAL_COMMUNITY): Payer: Self-pay

## 2016-10-17 ENCOUNTER — Encounter (INDEPENDENT_AMBULATORY_CARE_PROVIDER_SITE_OTHER): Payer: Self-pay

## 2016-10-17 ENCOUNTER — Ambulatory Visit (HOSPITAL_COMMUNITY)
Admission: RE | Admit: 2016-10-17 | Discharge: 2016-10-17 | Disposition: A | Discharge status: Home / Self Care | Payer: 59 | Source: Ambulatory Visit | Attending: Orthopedic Surgery | Admitting: Orthopedic Surgery

## 2016-10-17 DIAGNOSIS — M48061 Spinal stenosis, lumbar region without neurogenic claudication: Secondary | ICD-10-CM | POA: Insufficient documentation

## 2016-10-17 DIAGNOSIS — Z96642 Presence of left artificial hip joint: Secondary | ICD-10-CM | POA: Insufficient documentation

## 2016-10-17 DIAGNOSIS — M4316 Spondylolisthesis, lumbar region: Secondary | ICD-10-CM | POA: Insufficient documentation

## 2016-10-17 DIAGNOSIS — I700 Atherosclerosis of aorta: Secondary | ICD-10-CM | POA: Diagnosis not present

## 2016-10-17 DIAGNOSIS — Z01818 Encounter for other preprocedural examination: Secondary | ICD-10-CM | POA: Diagnosis not present

## 2016-10-17 DIAGNOSIS — M879 Osteonecrosis, unspecified: Secondary | ICD-10-CM | POA: Diagnosis not present

## 2016-10-17 HISTORY — DX: Chronic obstructive pulmonary disease, unspecified: J44.9

## 2016-10-17 HISTORY — DX: Unspecified osteoarthritis, unspecified site: M19.90

## 2016-10-17 LAB — BASIC METABOLIC PANEL
Anion gap: 6 (ref 5–15)
BUN: 18 mg/dL (ref 6–20)
CALCIUM: 9.2 mg/dL (ref 8.9–10.3)
CHLORIDE: 106 mmol/L (ref 101–111)
CO2: 29 mmol/L (ref 22–32)
CREATININE: 1.17 mg/dL (ref 0.61–1.24)
GFR calc Af Amer: >60 mL/min (ref >60)
GFR calc non Af Amer: >60 mL/min (ref >60)
GLUCOSE: 112 mg/dL — AB (ref 65–99)
Potassium: 4.2 mmol/L (ref 3.5–5.1)
Sodium: 141 mmol/L (ref 135–145)

## 2016-10-17 LAB — SURGICAL PCR SCREEN
MRSA, PCR: NEGATIVE
Staphylococcus aureus: POSITIVE — AB

## 2016-10-17 LAB — CBC
HCT: 40.7 % (ref 39.0–52.0)
Hemoglobin: 14.2 g/dL (ref 13.0–17.0)
MCH: 33.6 pg (ref 26.0–34.0)
MCHC: 34.9 g/dL (ref 30.0–36.0)
MCV: 96.4 fL (ref 78.0–100.0)
PLATELETS: 180 10*3/uL (ref 150–400)
RBC: 4.22 MIL/uL (ref 4.22–5.81)
RDW: 12.4 % (ref 11.5–15.5)
WBC: 8.4 10*3/uL (ref 4.0–10.5)

## 2016-10-20 ENCOUNTER — Ambulatory Visit: Payer: Self-pay | Admitting: Orthopedic Surgery

## 2016-10-21 ENCOUNTER — Encounter (HOSPITAL_COMMUNITY): Payer: Self-pay | Admitting: Anesthesiology

## 2016-10-21 NOTE — Anesthesia Preprocedure Evaluation (Addendum)
Anesthesia Evaluation  Patient identified by MRN, date of birth, ID band Patient awake    Reviewed: Allergy & Precautions, NPO status , Patient's Chart, lab work & pertinent test results  Airway Mallampati: II  TM Distance: >3 FB Neck ROM: Full    Dental  (+) Teeth Intact, Caps   Pulmonary shortness of breath and with exertion, COPD,  COPD inhaler, Current Smoker,    Pulmonary exam normal breath sounds clear to auscultation       Cardiovascular hypertension, Pt. on medications Normal cardiovascular exam Rhythm:Regular Rate:Normal     Neuro/Psych Peripheral neuropathy  Neuromuscular disease negative psych ROS   GI/Hepatic negative GI ROS, Neg liver ROS,   Endo/Other  negative endocrine ROS  Renal/GU negative Renal ROS  negative genitourinary   Musculoskeletal  (+) Arthritis , Osteoarthritis,  HNP L3-4, L4-5 Lumbar spinal stenosis   Abdominal   Peds  Hematology negative hematology ROS (+)   Anesthesia Other Findings   Reproductive/Obstetrics                              Chemistry      Component Value Date/Time   NA 141 10/17/2016 1453   K 4.2 10/17/2016 1453   CL 106 10/17/2016 1453   CO2 29 10/17/2016 1453   BUN 18 10/17/2016 1453   CREATININE 1.17 10/17/2016 1453      Component Value Date/Time   CALCIUM 9.2 10/17/2016 1453   ALKPHOS 58 08/17/2015 0908   AST 22 08/17/2015 0908   ALT 14 (L) 08/17/2015 0908   BILITOT 1.3 (H) 08/17/2015 0908     Lab Results  Component Value Date   WBC 8.4 10/17/2016   HGB 14.2 10/17/2016   HCT 40.7 10/17/2016   MCV 96.4 10/17/2016   PLT 180 10/17/2016   EKG: normal EKG, normal sinus rhythm.  Anesthesia Physical Anesthesia Plan  ASA: II  Anesthesia Plan: General   Post-op Pain Management:    Induction: Intravenous  Airway Management Planned: Oral ETT  Additional Equipment:   Intra-op Plan:   Post-operative Plan: Extubation in  OR  Informed Consent: I have reviewed the patients History and Physical, chart, labs and discussed the procedure including the risks, benefits and alternatives for the proposed anesthesia with the patient or authorized representative who has indicated his/her understanding and acceptance.   Dental advisory given  Plan Discussed with: Anesthesiologist, CRNA and Surgeon  Anesthesia Plan Comments:        Anesthesia Quick Evaluation

## 2016-10-22 ENCOUNTER — Ambulatory Visit (HOSPITAL_COMMUNITY): Payer: 59 | Admitting: Anesthesiology

## 2016-10-22 ENCOUNTER — Ambulatory Visit (HOSPITAL_COMMUNITY)
Admission: RE | Admit: 2016-10-22 | Discharge: 2016-10-23 | Disposition: A | Discharge status: Home / Self Care | Payer: 59 | Source: Ambulatory Visit | Attending: Specialist | Admitting: Specialist

## 2016-10-22 ENCOUNTER — Ambulatory Visit (HOSPITAL_COMMUNITY): Payer: 59

## 2016-10-22 ENCOUNTER — Encounter (HOSPITAL_COMMUNITY): Payer: Self-pay | Admitting: *Deleted

## 2016-10-22 ENCOUNTER — Encounter (HOSPITAL_COMMUNITY)
Admission: RE | Disposition: A | Discharge status: Home / Self Care | Payer: Self-pay | Source: Ambulatory Visit | Attending: Specialist

## 2016-10-22 DIAGNOSIS — G629 Polyneuropathy, unspecified: Secondary | ICD-10-CM | POA: Insufficient documentation

## 2016-10-22 DIAGNOSIS — J449 Chronic obstructive pulmonary disease, unspecified: Secondary | ICD-10-CM | POA: Insufficient documentation

## 2016-10-22 DIAGNOSIS — M5126 Other intervertebral disc displacement, lumbar region: Secondary | ICD-10-CM | POA: Insufficient documentation

## 2016-10-22 DIAGNOSIS — Z419 Encounter for procedure for purposes other than remedying health state, unspecified: Secondary | ICD-10-CM

## 2016-10-22 DIAGNOSIS — I10 Essential (primary) hypertension: Secondary | ICD-10-CM | POA: Diagnosis not present

## 2016-10-22 DIAGNOSIS — Z96642 Presence of left artificial hip joint: Secondary | ICD-10-CM | POA: Diagnosis not present

## 2016-10-22 DIAGNOSIS — Z79899 Other long term (current) drug therapy: Secondary | ICD-10-CM | POA: Diagnosis not present

## 2016-10-22 DIAGNOSIS — F17210 Nicotine dependence, cigarettes, uncomplicated: Secondary | ICD-10-CM | POA: Diagnosis not present

## 2016-10-22 DIAGNOSIS — M48061 Spinal stenosis, lumbar region without neurogenic claudication: Secondary | ICD-10-CM | POA: Diagnosis present

## 2016-10-22 DIAGNOSIS — M48062 Spinal stenosis, lumbar region with neurogenic claudication: Secondary | ICD-10-CM | POA: Diagnosis not present

## 2016-10-22 HISTORY — DX: Dyspnea, unspecified: R06.00

## 2016-10-22 HISTORY — DX: Nicotine dependence, cigarettes, uncomplicated: F17.210

## 2016-10-22 HISTORY — DX: Viral wart, unspecified: B07.9

## 2016-10-22 HISTORY — DX: Polyneuropathy, unspecified: G62.9

## 2016-10-22 HISTORY — DX: Pure hypercholesterolemia, unspecified: E78.00

## 2016-10-22 HISTORY — PX: LUMBAR LAMINECTOMY/DECOMPRESSION MICRODISCECTOMY: SHX5026

## 2016-10-22 HISTORY — DX: Sebaceous cyst: L72.3

## 2016-10-22 LAB — TYPE AND SCREEN
ABO/RH(D): O POS
Antibody Screen: NEGATIVE

## 2016-10-22 SURGERY — LUMBAR LAMINECTOMY/DECOMPRESSION MICRODISCECTOMY 2 LEVELS
Anesthesia: General

## 2016-10-22 MED ORDER — EPHEDRINE SULFATE-NACL 50-0.9 MG/10ML-% IV SOSY
PREFILLED_SYRINGE | INTRAVENOUS | Status: DC | PRN
  Administered 2016-10-22: 5 mg via INTRAVENOUS
  Administered 2016-10-22: 10 mg via INTRAVENOUS

## 2016-10-22 MED ORDER — HYDROMORPHONE HCL 1 MG/ML IJ SOLN
0.5000 mg | INTRAMUSCULAR | Status: DC | PRN
  Administered 2016-10-22: 0.5 mg via INTRAVENOUS
  Filled 2016-10-22: qty 1

## 2016-10-22 MED ORDER — MIDAZOLAM HCL 5 MG/5ML IJ SOLN
INTRAMUSCULAR | Status: DC | PRN
  Administered 2016-10-22: 2 mg via INTRAVENOUS

## 2016-10-22 MED ORDER — HYDROMORPHONE HCL 1 MG/ML IJ SOLN
INTRAMUSCULAR | Status: AC
  Filled 2016-10-22: qty 1

## 2016-10-22 MED ORDER — SUGAMMADEX SODIUM 200 MG/2ML IV SOLN
INTRAVENOUS | Status: DC | PRN
  Administered 2016-10-22: 200 mg via INTRAVENOUS

## 2016-10-22 MED ORDER — ONDANSETRON HCL 4 MG/2ML IJ SOLN: 4.0000 mg | Freq: Once | INTRAMUSCULAR | Status: DC | PRN

## 2016-10-22 MED ORDER — LIDOCAINE 2% (20 MG/ML) 5 ML SYRINGE
INTRAMUSCULAR | Status: AC
  Filled 2016-10-22: qty 5

## 2016-10-22 MED ORDER — THROMBIN 5000 UNITS EX SOLR
CUTANEOUS | Status: AC
  Filled 2016-10-22: qty 10000

## 2016-10-22 MED ORDER — GABAPENTIN 300 MG PO CAPS
600.0000 mg | ORAL_CAPSULE | Freq: Every day | ORAL | Status: DC
  Administered 2016-10-22: 600 mg via ORAL
  Filled 2016-10-22: qty 2

## 2016-10-22 MED ORDER — POLYETHYLENE GLYCOL 3350 17 G PO PACK: 17.0000 g | PACK | Freq: Every day | ORAL | Status: DC | PRN

## 2016-10-22 MED ORDER — SODIUM CHLORIDE 0.9 % IR SOLN
Status: DC | PRN
  Administered 2016-10-22: 500 mL

## 2016-10-22 MED ORDER — FENTANYL CITRATE (PF) 100 MCG/2ML IJ SOLN
INTRAMUSCULAR | Status: AC
  Filled 2016-10-22: qty 4

## 2016-10-22 MED ORDER — METHOCARBAMOL 1000 MG/10ML IJ SOLN
500.0000 mg | Freq: Four times a day (QID) | INTRAVENOUS | Status: DC | PRN
  Administered 2016-10-22: 500 mg via INTRAVENOUS
  Filled 2016-10-22: qty 5
  Filled 2016-10-22: qty 550

## 2016-10-22 MED ORDER — RISAQUAD PO CAPS
1.0000 | ORAL_CAPSULE | Freq: Every day | ORAL | Status: DC
  Administered 2016-10-22 – 2016-10-23 (×2): 1 via ORAL
  Filled 2016-10-22 (×2): qty 1

## 2016-10-22 MED ORDER — CLINDAMYCIN PHOSPHATE 900 MG/50ML IV SOLN
INTRAVENOUS | Status: AC
  Filled 2016-10-22: qty 50

## 2016-10-22 MED ORDER — SUGAMMADEX SODIUM 200 MG/2ML IV SOLN
INTRAVENOUS | Status: AC
  Filled 2016-10-22: qty 2

## 2016-10-22 MED ORDER — MOMETASONE FURO-FORMOTEROL FUM 200-5 MCG/ACT IN AERO
2.0000 | INHALATION_SPRAY | Freq: Two times a day (BID) | RESPIRATORY_TRACT | Status: DC
  Administered 2016-10-22 – 2016-10-23 (×2): 2 via RESPIRATORY_TRACT
  Filled 2016-10-22: qty 8.8

## 2016-10-22 MED ORDER — BISACODYL 5 MG PO TBEC: 5.0000 mg | DELAYED_RELEASE_TABLET | Freq: Every day | ORAL | Status: DC | PRN

## 2016-10-22 MED ORDER — CEFAZOLIN SODIUM-DEXTROSE 2-4 GM/100ML-% IV SOLN
2.0000 g | Freq: Three times a day (TID) | INTRAVENOUS | Status: AC
  Administered 2016-10-22 – 2016-10-23 (×3): 2 g via INTRAVENOUS
  Filled 2016-10-22 (×3): qty 100

## 2016-10-22 MED ORDER — HYDROMORPHONE HCL 1 MG/ML IJ SOLN
0.2500 mg | INTRAMUSCULAR | Status: DC | PRN
  Administered 2016-10-22: 0.5 mg via INTRAVENOUS

## 2016-10-22 MED ORDER — ACETAMINOPHEN 325 MG PO TABS: 650.0000 mg | ORAL_TABLET | ORAL | Status: DC | PRN

## 2016-10-22 MED ORDER — OXYCODONE-ACETAMINOPHEN 5-325 MG PO TABS: 1.0000 | ORAL_TABLET | ORAL | 0 refills | Status: DC | PRN

## 2016-10-22 MED ORDER — KCL IN DEXTROSE-NACL 20-5-0.45 MEQ/L-%-% IV SOLN
INTRAVENOUS | Status: AC
  Administered 2016-10-22: 13:00:00 via INTRAVENOUS
  Filled 2016-10-22 (×2): qty 1000

## 2016-10-22 MED ORDER — ONDANSETRON HCL 4 MG/2ML IJ SOLN
INTRAMUSCULAR | Status: AC
  Filled 2016-10-22: qty 2

## 2016-10-22 MED ORDER — LIDOCAINE-EPINEPHRINE (PF) 2 %-1:200000 IJ SOLN
INTRAMUSCULAR | Status: AC
  Filled 2016-10-22: qty 20

## 2016-10-22 MED ORDER — SODIUM CHLORIDE 0.9 % IR SOLN
Status: AC
  Filled 2016-10-22: qty 500000

## 2016-10-22 MED ORDER — PROPOFOL 10 MG/ML IV BOLUS
INTRAVENOUS | Status: DC | PRN
  Administered 2016-10-22: 220 mg via INTRAVENOUS

## 2016-10-22 MED ORDER — FENTANYL CITRATE (PF) 100 MCG/2ML IJ SOLN
INTRAMUSCULAR | Status: AC
  Filled 2016-10-22: qty 2

## 2016-10-22 MED ORDER — LIDOCAINE-EPINEPHRINE 2 %-1:100000 IJ SOLN
INTRAMUSCULAR | Status: DC | PRN
  Administered 2016-10-22: 8.5 mL

## 2016-10-22 MED ORDER — ROCURONIUM BROMIDE 50 MG/5ML IV SOSY
PREFILLED_SYRINGE | INTRAVENOUS | Status: AC
  Filled 2016-10-22: qty 5

## 2016-10-22 MED ORDER — ROCURONIUM BROMIDE 10 MG/ML (PF) SYRINGE
PREFILLED_SYRINGE | INTRAVENOUS | Status: DC | PRN
  Administered 2016-10-22: 10 mg via INTRAVENOUS
  Administered 2016-10-22: 40 mg via INTRAVENOUS
  Administered 2016-10-22: 5 mg via INTRAVENOUS
  Administered 2016-10-22: 10 mg via INTRAVENOUS

## 2016-10-22 MED ORDER — MAGNESIUM CITRATE PO SOLN: 1.0000 | Freq: Once | ORAL | Status: DC | PRN

## 2016-10-22 MED ORDER — LIDOCAINE HCL (CARDIAC) 20 MG/ML IV SOLN
INTRAVENOUS | Status: DC | PRN
  Administered 2016-10-22: 100 mg via INTRAVENOUS

## 2016-10-22 MED ORDER — MEPERIDINE HCL 50 MG/ML IJ SOLN
6.2500 mg | INTRAMUSCULAR | Status: DC | PRN
Start: 2016-10-22 — End: 2016-10-22

## 2016-10-22 MED ORDER — ONDANSETRON HCL 4 MG/2ML IJ SOLN: 4.0000 mg | INTRAMUSCULAR | Status: DC | PRN

## 2016-10-22 MED ORDER — THROMBIN 5000 UNITS EX SOLR
CUTANEOUS | Status: DC | PRN
  Administered 2016-10-22: 5000 [IU] via TOPICAL

## 2016-10-22 MED ORDER — METHOCARBAMOL 500 MG PO TABS: 500.0000 mg | ORAL_TABLET | Freq: Four times a day (QID) | ORAL | Status: DC | PRN

## 2016-10-22 MED ORDER — LOSARTAN POTASSIUM 50 MG PO TABS
100.0000 mg | ORAL_TABLET | Freq: Every day | ORAL | Status: DC
  Administered 2016-10-23: 100 mg via ORAL
  Filled 2016-10-22: qty 2

## 2016-10-22 MED ORDER — TRIAMTERENE-HCTZ 37.5-25 MG PO TABS
1.0000 | ORAL_TABLET | Freq: Every day | ORAL | Status: DC
  Administered 2016-10-23: 1 via ORAL
  Filled 2016-10-22: qty 1

## 2016-10-22 MED ORDER — FENTANYL CITRATE (PF) 100 MCG/2ML IJ SOLN
INTRAMUSCULAR | Status: DC | PRN
  Administered 2016-10-22 (×3): 50 ug via INTRAVENOUS
  Administered 2016-10-22: 150 ug via INTRAVENOUS
  Administered 2016-10-22: 50 ug via INTRAVENOUS

## 2016-10-22 MED ORDER — ONDANSETRON HCL 4 MG/2ML IJ SOLN
INTRAMUSCULAR | Status: DC | PRN
  Administered 2016-10-22: 4 mg via INTRAVENOUS

## 2016-10-22 MED ORDER — SUCCINYLCHOLINE CHLORIDE 200 MG/10ML IV SOSY
PREFILLED_SYRINGE | INTRAVENOUS | Status: AC
  Filled 2016-10-22: qty 10

## 2016-10-22 MED ORDER — PHENYLEPHRINE 40 MCG/ML (10ML) SYRINGE FOR IV PUSH (FOR BLOOD PRESSURE SUPPORT)
PREFILLED_SYRINGE | INTRAVENOUS | Status: AC
  Filled 2016-10-22: qty 10

## 2016-10-22 MED ORDER — DOCUSATE SODIUM 100 MG PO CAPS: 100.0000 mg | ORAL_CAPSULE | Freq: Two times a day (BID) | ORAL | 1 refills | Status: DC | PRN

## 2016-10-22 MED ORDER — CEFAZOLIN SODIUM-DEXTROSE 2-4 GM/100ML-% IV SOLN
2.0000 g | INTRAVENOUS | Status: AC
  Administered 2016-10-22: 2 g via INTRAVENOUS
  Filled 2016-10-22: qty 100

## 2016-10-22 MED ORDER — CEFAZOLIN SODIUM-DEXTROSE 2-4 GM/100ML-% IV SOLN
INTRAVENOUS | Status: AC
  Filled 2016-10-22: qty 100

## 2016-10-22 MED ORDER — CLINDAMYCIN PHOSPHATE 900 MG/50ML IV SOLN
900.0000 mg | INTRAVENOUS | Status: AC
  Administered 2016-10-22: 900 mg via INTRAVENOUS

## 2016-10-22 MED ORDER — MIDAZOLAM HCL 2 MG/2ML IJ SOLN
INTRAMUSCULAR | Status: AC
  Filled 2016-10-22: qty 2

## 2016-10-22 MED ORDER — SUCCINYLCHOLINE CHLORIDE 200 MG/10ML IV SOSY
PREFILLED_SYRINGE | INTRAVENOUS | Status: DC | PRN
  Administered 2016-10-22: 120 mg via INTRAVENOUS

## 2016-10-22 MED ORDER — HYDROCODONE-ACETAMINOPHEN 5-325 MG PO TABS: 1.0000 | ORAL_TABLET | ORAL | Status: DC | PRN

## 2016-10-22 MED ORDER — ALUM & MAG HYDROXIDE-SIMETH 200-200-20 MG/5ML PO SUSP: 30.0000 mL | Freq: Four times a day (QID) | ORAL | Status: DC | PRN

## 2016-10-22 MED ORDER — EPHEDRINE 5 MG/ML INJ
INTRAVENOUS | Status: AC
  Filled 2016-10-22: qty 10

## 2016-10-22 MED ORDER — OXYCODONE-ACETAMINOPHEN 5-325 MG PO TABS
1.0000 | ORAL_TABLET | ORAL | Status: DC | PRN
  Administered 2016-10-22 (×2): 1 via ORAL
  Administered 2016-10-22 – 2016-10-23 (×5): 2 via ORAL
  Filled 2016-10-22: qty 1
  Filled 2016-10-22 (×4): qty 2
  Filled 2016-10-22: qty 1
  Filled 2016-10-22: qty 2

## 2016-10-22 MED ORDER — PHENYLEPHRINE 40 MCG/ML (10ML) SYRINGE FOR IV PUSH (FOR BLOOD PRESSURE SUPPORT)
PREFILLED_SYRINGE | INTRAVENOUS | Status: DC | PRN
  Administered 2016-10-22 (×4): 80 ug via INTRAVENOUS

## 2016-10-22 MED ORDER — MENTHOL 3 MG MT LOZG: 1.0000 | LOZENGE | OROMUCOSAL | Status: DC | PRN

## 2016-10-22 MED ORDER — BUPIVACAINE HCL (PF) 0.5 % IJ SOLN
INTRAMUSCULAR | Status: AC
  Filled 2016-10-22: qty 30

## 2016-10-22 MED ORDER — BUPIVACAINE HCL (PF) 0.5 % IJ SOLN
INTRAMUSCULAR | Status: DC | PRN
  Administered 2016-10-22: 8.5 mL

## 2016-10-22 MED ORDER — LACTATED RINGERS IV SOLN
INTRAVENOUS | Status: DC
  Administered 2016-10-22: 07:00:00 via INTRAVENOUS

## 2016-10-22 MED ORDER — DOCUSATE SODIUM 100 MG PO CAPS
100.0000 mg | ORAL_CAPSULE | Freq: Two times a day (BID) | ORAL | Status: DC
  Administered 2016-10-22 – 2016-10-23 (×2): 100 mg via ORAL
  Filled 2016-10-22 (×2): qty 1

## 2016-10-22 MED ORDER — ACETAMINOPHEN 650 MG RE SUPP: 650.0000 mg | RECTAL | Status: DC | PRN

## 2016-10-22 MED ORDER — PROPOFOL 10 MG/ML IV BOLUS
INTRAVENOUS | Status: AC
  Filled 2016-10-22: qty 40

## 2016-10-22 MED ORDER — PHENOL 1.4 % MT LIQD: 1.0000 | OROMUCOSAL | Status: DC | PRN

## 2016-10-22 MED ORDER — POLYETHYLENE GLYCOL 3350 17 G PO PACK: 17.0000 g | PACK | Freq: Every day | ORAL | 0 refills | Status: DC

## 2016-10-22 SURGICAL SUPPLY — 47 items
BAG ZIPLOCK 12X15 (MISCELLANEOUS) IMPLANT
CLEANER TIP ELECTROSURG 2X2 (MISCELLANEOUS) ×2 IMPLANT
CLOTH 2% CHLOROHEXIDINE 3PK (PERSONAL CARE ITEMS) ×2 IMPLANT
DRAPE MICROSCOPE LEICA (MISCELLANEOUS) ×2 IMPLANT
DRAPE SHEET LG 3/4 BI-LAMINATE (DRAPES) IMPLANT
DRAPE SURG 17X11 SM STRL (DRAPES) ×2 IMPLANT
DRAPE UTILITY XL STRL (DRAPES) ×2 IMPLANT
DRSG AQUACEL AG ADV 3.5X 4 (GAUZE/BANDAGES/DRESSINGS) IMPLANT
DRSG AQUACEL AG ADV 3.5X 6 (GAUZE/BANDAGES/DRESSINGS) ×2 IMPLANT
DURAPREP 26ML APPLICATOR (WOUND CARE) ×2 IMPLANT
DURASEAL SPINE SEALANT 3ML (MISCELLANEOUS) IMPLANT
ELECT BLADE TIP CTD 4 INCH (ELECTRODE) IMPLANT
ELECT REM PT RETURN 9FT ADLT (ELECTROSURGICAL) ×2
ELECTRODE REM PT RTRN 9FT ADLT (ELECTROSURGICAL) ×1 IMPLANT
GLOVE BIOGEL PI IND STRL 7.0 (GLOVE) ×3 IMPLANT
GLOVE BIOGEL PI INDICATOR 7.0 (GLOVE) ×3
GLOVE SURG SS PI 7.0 STRL IVOR (GLOVE) ×2 IMPLANT
GLOVE SURG SS PI 8.0 STRL IVOR (GLOVE) ×4 IMPLANT
GOWN STRL REUS W/TWL XL LVL3 (GOWN DISPOSABLE) ×6 IMPLANT
HEMOSTAT SPONGE AVITENE ULTRA (HEMOSTASIS) IMPLANT
IV CATH 14GX2 1/4 (CATHETERS) ×4 IMPLANT
KIT BASIN OR (CUSTOM PROCEDURE TRAY) ×2 IMPLANT
KIT POSITIONING SURG ANDREWS (MISCELLANEOUS) ×2 IMPLANT
MANIFOLD NEPTUNE II (INSTRUMENTS) ×2 IMPLANT
MARKER SKIN DUAL TIP RULER LAB (MISCELLANEOUS) ×2 IMPLANT
NEEDLE SPNL 18GX3.5 QUINCKE PK (NEEDLE) ×4 IMPLANT
PACK LAMINECTOMY ORTHO (CUSTOM PROCEDURE TRAY) ×2 IMPLANT
PATTIES SURGICAL .5 X.5 (GAUZE/BANDAGES/DRESSINGS) ×2 IMPLANT
PATTIES SURGICAL .75X.75 (GAUZE/BANDAGES/DRESSINGS) ×2 IMPLANT
PATTIES SURGICAL 1X1 (DISPOSABLE) IMPLANT
RUBBERBAND STERILE (MISCELLANEOUS) ×4 IMPLANT
SPONGE LAP 4X18 X RAY DECT (DISPOSABLE) IMPLANT
SPONGE SURGIFOAM ABS GEL 100 (HEMOSTASIS) ×2 IMPLANT
STAPLER VISISTAT (STAPLE) ×2 IMPLANT
STRIP CLOSURE SKIN 1/2X4 (GAUZE/BANDAGES/DRESSINGS) ×2 IMPLANT
SUT NURALON 4 0 TR CR/8 (SUTURE) IMPLANT
SUT PROLENE 3 0 PS 2 (SUTURE) IMPLANT
SUT VIC AB 1 CT1 27 (SUTURE)
SUT VIC AB 1 CT1 27XBRD ANTBC (SUTURE) IMPLANT
SUT VIC AB 1-0 CT2 27 (SUTURE) IMPLANT
SUT VIC AB 2-0 CT1 27 (SUTURE)
SUT VIC AB 2-0 CT1 TAPERPNT 27 (SUTURE) IMPLANT
SUT VIC AB 2-0 CT2 27 (SUTURE) IMPLANT
SYR 3ML LL SCALE MARK (SYRINGE) ×2 IMPLANT
TOWEL OR 17X26 10 PK STRL BLUE (TOWEL DISPOSABLE) ×2 IMPLANT
TOWEL OR NON WOVEN STRL DISP B (DISPOSABLE) ×2 IMPLANT
YANKAUER SUCT BULB TIP NO VENT (SUCTIONS) ×2 IMPLANT

## 2016-10-22 NOTE — H&P (View-Only) (Signed)
Mark Stevenson is an 61 y.o. male.   Chief Complaint: back and leg pain HPI: The patient is a 61 year old male who presents today for follow up of their back. The patient is being followed for their low back pain and back symptoms. They are now year(s) out from when symptoms began. Symptoms reported today include: pain. Current treatment includes: activity modification and NSAIDs (Aleve). The following medication has been used for pain control: antiinflammatory medication. The patient presents today following CT/Myelogram.  Mark Stevenson follows up with CT myelogram results. He still reports left-sided hip pain that radiates down into the thigh. He reports some weakness on this side. His myelogram indicated no instability. He has L1 compression fracture deformity. He has what appears to be a left-sided disc herniation at L3-4 behind the vertebral body of L4, disc herniation, possible scar tissue from previous surgery. There is mass effect upon the L4 nerve root. There is lateral recess stenosis and short pedicles at L4-5 and facet arthropathy at L5-S1.  Calcifications of the aorta is noted as well. Mark Stevenson reports that he did see a vascular surgeon who did ABIs and balloon angioplasty distal in his leg, but it was cleared proximally and that was performed earlier in the year. He had in that left side an ABI of 0.86 and monophasic areas, triphasic on the right. His symptoms are on the left. He apparently underwent he reports balloon angioplasty on his left. No changes in his proximal symptoms. He did have an epidural at L4-5. He reported perhaps a minimal temporary relief from his symptoms. He had MRI of his lumbar spine demonstrating disc protrusion paracentral to the left at L3-4, facet arthrosis and severe lateral recess stenosis at L3-4, and lateral recess stenosis at L4- 5.  Past Medical History:  Diagnosis Date  . Hypertension     Past Surgical History:  Procedure Laterality Date  .  ankles     tarsal tunnel surgery bilat  . BACK SURGERY    . broken knee cap    . crushed L-4 vertebrae  2008  . KNEE ARTHROSCOPY     x3 bilat  . PERIPHERAL VASCULAR CATHETERIZATION N/A 04/22/2016   Procedure: Abdominal Aortogram w/ bilateral Lower Extremity Runoff;  Surgeon: Serafina Mitchell, MD;  Location: [facility];  Service: Cardiovascular;  Laterality: N/A;  . PERIPHERAL VASCULAR CATHETERIZATION Left 04/22/2016   Procedure: Peripheral Vascular Atherectomy;  Surgeon: Serafina Mitchell, MD;  Location: [facility];  Service: Cardiovascular;  Laterality: Left;  . ruptured disc    . tendons in calves lengthened     plantar fascitis  . TOTAL HIP ARTHROPLASTY  2012    Family History  Problem Relation Age of Onset  . Colon cancer Son   . Liver cancer Son   . Pancreatic cancer Neg Hx   . Stomach cancer Neg Hx    Social History:  reports that he has been smoking Cigarettes.  He has been smoking about 1.50 packs per day. He has never used smokeless tobacco. He reports that he drinks about 6.0 oz of alcohol per week . His drug history is not on file.  Allergies: No Known Allergies   (Not in a hospital admission)  No results found for this or any previous visit (from the past 48 hour(s)). No results found.  Review of Systems  Constitutional: Negative.   HENT: Negative.   Eyes: Negative.   Respiratory: Negative.   Cardiovascular: Negative.   Gastrointestinal:  Negative.   Genitourinary: Negative.   Musculoskeletal: Positive for back pain.  Skin: Negative.   Neurological: Positive for sensory change and focal weakness.  Psychiatric/Behavioral: Negative.     There were no vitals taken for this visit. Physical Exam  Constitutional: He is oriented to person, place, and time. He appears well-developed.  HENT:  Head: Normocephalic.  Eyes: Pupils are equal, round, and reactive to light.  Neck: Normal range of motion.  Cardiovascular: Normal rate.   Respiratory: Effort  normal.  GI: Soft.  Musculoskeletal:  His straight leg raise, buttock and thigh pain. He does have decreased knee reflex on the left compared to the right. It is prominent slightly and 4+/5 quad strength on left compared to the right. Pain with extension and flexion of the lumbar spine. Lumbar spine exam reveals no evidence of soft tissue swelling, deformity or skin ecchymosis. On palpation there is no tenderness of the lumbar spine. No flank pain with percussion. The abdomen is soft and nontender. Nontender over the trochanters. No cellulitis or lymphadenopathy.  Good range of motion of the lumbar spine without associated pain. Straight leg raise is negative. Motor is 5/5 including EHL, tibialis anterior, plantar flexion, quadriceps and hamstrings. Patient is normoreflexic. There is no Babinski or clonus. Sensory exam is intact to light touch. Patient has good distal pulses. No DVT. No pain and normal range of motion without instability of the hips, knees and ankles.  Inspection of the cervical spine reveals a normal lordosis without evidence of paraspinous spasms or soft tissue swelling. Nontender to palpation. Full flexion, full extension, full left and right lateral rotation. Extension combined with lateral flexion does not reproduce pain. Negative impingement sign, negative secondary impingement sign of the shoulders. Negative Tinel's median and ulnar nerves at the elbow. Negative carpal compression test at the wrist. Motor of the upper extremities is 5/5 including biceps, triceps, brachioradialis, wrist flexion, wrist extension, finger flexion, finger extension. Reflexes are normoreflexic. Sensory exam is intact to light touch. There is no Hoffmann sign. Nontender over the thoracic spine.  Neurological: He is alert and oriented to person, place, and time.  Skin: Skin is warm and dry.     Assessment/Plan 1. Left lower extremity radicular pain secondary disc herniation at L3-4, lateral recess  stenosis at L3-4 and L4-5. 2. 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 3. Peripheral vascular disease, ABIs satisfactory. 4. History of tobacco use. 5. Disc degeneration.  We had an extensive discussion concerning current pathology, relevant anatomy, and treatment options, either living with the symptoms or consider surgical intervention. We did discuss his current symptomatology and his multiple studies reviewing his ABIs, his myelogram, his MRI, his symptomatology, his epidurals, his previous studies and the treatment algorithm accordingly. He would like to proceed with surgical intervention. We discussed surgery for leg pain and buttock and hip pain versus back pain, back pain requiring multilevel fusion. He is not interested in that at this point and therefore discussed decompression at L3-4 and L4-5 revisions. With a residual that it would not address back pain, but leg pain. He could have hip pain referred from his facets. However, if the decreased knee reflex and the quad strengthening is definitely at L4 and that is consistent with his studies. We will proceed accordingly.  I had an extensive discussion of the risks and benefits of the lumbar decompression with the patient including bleeding, infection, damage to neurovascular structures, epidural fibrosis, CSF leak requiring repair. We also  discussed increase in pain, adjacent segment disease, recurrent disc herniation, need for future surgery including repeat decompression and/or fusion. We also discussed risks of postoperative hematoma, paralysis, anesthetic complications including DVT, PE, death, cardiopulmonary dysfunction. In addition, the perioperative and postoperative courses were discussed in detail including the rehabilitative time and return to functional activity and work. I provided the patient with an illustrated handout and utilized the appropriate surgical models.  We did  assess increased levels of possible CSF leakage, DVT, PE, etc. and the time to recovery. Any change in the interim, he is to call. We will have him undergo preoperative clearance.  Plan revision microlumbar decompression L3-4, L4-5  BISSELL, Conley Rolls., PA-C for Dr. Tonita Cong 10/07/2016, 2:56 PM

## 2016-10-22 NOTE — Anesthesia Postprocedure Evaluation (Signed)
Anesthesia Post Note  Patient: Mark Stevenson  Procedure(s) Performed: Procedure(s) (LRB): MICRO LUMBAR DECOMPRESSION L3-L4, L4-L5 REVISION  2 LEVELS (N/A)  Patient location during evaluation: MAU Anesthesia Type: General Level of consciousness: awake and alert and oriented Pain management: pain level controlled Vital Signs Assessment: post-procedure vital signs reviewed and stable Respiratory status: spontaneous breathing, nonlabored ventilation and respiratory function stable Cardiovascular status: blood pressure returned to baseline and stable Postop Assessment: no signs of nausea or vomiting Anesthetic complications: no       Last Vitals:  Vitals:   10/22/16 1115 10/22/16 1135  BP: 110/79 115/74  Pulse: 78 76  Resp: 20 16  Temp: 36.8 C 37.2 C    Last Pain:  Vitals:   10/22/16 1115  TempSrc:   PainSc: 1                  Pleas Carneal A.

## 2016-10-22 NOTE — Evaluation (Signed)
Physical Therapy Evaluation Patient Details Name: Mark Stevenson MRN: [id number] DOB: 10/08/1955 Today's Date: 10/22/2016   History of Present Illness  MICRO LUMBAR DECOMPRESSION L3-L4, L4-L5 REVISION  2 LEVELS (N/A)  Clinical Impression  The patient  Is tolerating  Mobility, ambulated x 5'.   Pt admitted with above diagnosis. Pt currently with functional limitations due to the deficits listed below (see PT Problem List).  Pt will benefit from skilled PT to increase their independence and safety with mobility to allow discharge to the venue listed below.       Follow Up Recommendations Home health PT;Supervision/Assistance - 24 hour (TBD)    Equipment Recommendations  Rolling walker with 5" wheels (TBD)    Recommendations for Other Services       Precautions / Restrictions Precautions Precautions: Back Precaution Comments: reviewed BLT and gave handout      Mobility  Bed Mobility Overal bed mobility: Needs Assistance Bed Mobility: Rolling;Sidelying to Sit Rolling: Min assist Sidelying to sit: Min assist       General bed mobility comments: cues for log roll  Transfers Overall transfer level: Needs assistance Equipment used: Rolling walker (2 wheeled) Transfers: Sit to/from Stand Sit to Stand: Min assist;From elevated surface         General transfer comment: cues for technique  Ambulation/Gait Ambulation/Gait assistance: Min assist Ambulation Distance (Feet): 5 Feet Assistive device: Rolling walker (2 wheeled) Gait Pattern/deviations: Step-to pattern     General Gait Details: 5' forward and backward.  Stairs            Wheelchair Mobility    Modified Rankin (Stroke Patients Only)       Balance                                             Pertinent Vitals/Pain Pain Assessment: 0-10 Pain Score: 6  Pain Location: back surgical site Pain Descriptors / Indicators: Tender;Discomfort;Grimacing;Guarding Pain Intervention(s):  Premedicated before session;Monitored during session    [place] expects to be discharged to:: Private residence Living Arrangements: Alone Available Help at Discharge: Family;Available PRN/intermittently Type of Home: House Home Access: Stairs to enter Entrance Stairs-Rails: None Entrance Stairs-Number of Steps: 4 Home Layout: One level Home Equipment: None      Prior Function Level of Independence: Independent               Hand Dominance        Extremity/Trunk Assessment   Upper Extremity Assessment Upper Extremity Assessment: Overall WFL for tasks assessed    Lower Extremity Assessment Lower Extremity Assessment: Generalized weakness    Cervical / Trunk Assessment Cervical / Trunk Assessment: Normal  Communication   Communication: No difficulties  Cognition Arousal/Alertness: Awake/alert Behavior During Therapy: WFL for tasks assessed/performed Overall Cognitive Status: Within Functional Limits for tasks assessed                      General Comments      Exercises     Assessment/Plan    PT Assessment Patient needs continued PT services  PT Problem List Decreased strength;Decreased range of motion;Decreased activity tolerance;Decreased mobility;Pain;Decreased knowledge of precautions;Decreased knowledge of use of DME;Decreased safety awareness          PT Treatment Interventions DME instruction;Gait training;Stair training;Functional mobility training;Therapeutic activities;Patient/family education    PT Goals (Current goals can be found in  the Care Plan section)  Acute Rehab PT Goals Patient Stated Goal: to get back to exercise PT Goal Formulation: With patient Time For Goal Achievement: 10/28/16 Potential to Achieve Goals: Good    Frequency Min 5X/week   Barriers to discharge        Co-evaluation               End of Session   Activity Tolerance: Patient tolerated treatment well Patient left: in  bed;with call bell/phone within reach Nurse Communication: Mobility status    Functional Assessment Tool Used: clinical judgement Functional Limitation: Mobility: Walking and moving around Mobility: Walking and Moving Around Current Status [id number]): At least 20 percent but less than 40 percent impaired, limited or restricted Mobility: Walking and Moving Around Goal Status [phone]): At least 1 percent but less than 20 percent impaired, limited or restricted    Time: [id number] PT Time Calculation (min) (ACUTE ONLY): 26 min   Charges:   PT Evaluation $PT Eval Low Complexity: 1 Procedure PT Treatments $Therapeutic Activity: 8-22 mins   PT G Codes:   PT G-Codes **NOT FOR INPATIENT CLASS** Functional Assessment Tool Used: clinical judgement Functional Limitation: Mobility: Walking and moving around Mobility: Walking and Moving Around Current Status [id number]): At least 20 percent but less than 40 percent impaired, limited or restricted Mobility: Walking and Moving Around Goal Status [phone]): At least 1 percent but less than 20 percent impaired, limited or restricted    Mark Stevenson 10/22/2016, 5:19 PM

## 2016-10-22 NOTE — Discharge Instructions (Signed)
Walk As Tolerated utilizing back precautions.  No bending, twisting, or lifting.  No driving for 2 weeks.   °Aquacel dressing may remain in place until follow up. May shower with aquacel dressing in place. If the dressing peels off or becomes saturated, you may remove aquacel dressing and place gauze and tape dressing which should be kept clean and dry and changed daily. Do not remove steri-strips if they are present. °See Dr. Kymber Kosar in office in 10 to 14 days. Begin taking aspirin 81mg per day starting 4 days after your surgery if not allergic to aspirin or on another blood thinner. °Walk daily even outside. Use a cane or walker only if necessary. °Avoid sitting on soft sofas. ° °

## 2016-10-22 NOTE — Transfer of Care (Signed)
Immediate Anesthesia Transfer of Care Note  Patient: Mark Stevenson  Procedure(s) Performed: Procedure(s) with comments: MICRO LUMBAR DECOMPRESSION L3-L4, L4-L5 REVISION  2 LEVELS (N/A) - Requests 150 mins  Patient Location: PACU  Anesthesia Type:General  Level of Consciousness: awake, alert , oriented and patient cooperative  Airway & Oxygen Therapy: Patient Spontanous Breathing and Patient connected to face mask oxygen  Post-op Assessment: Report given to RN, Post -op Vital signs reviewed and stable and Patient moving all extremities  Post vital signs: Reviewed and stable  Last Vitals:  Vitals:   10/22/16 0543 10/22/16 0611  BP: (!) 156/106 (!) 156/105  Pulse: 79 78  Resp: 18 16  Temp: 36.5 C     Last Pain:  Vitals:   10/22/16 0600  TempSrc:   PainSc: 5       Patients Stated Pain Goal: 2 ([id number])  Complications: No apparent anesthesia complications

## 2016-10-22 NOTE — Anesthesia Procedure Notes (Addendum)
Procedure Name: Intubation Date/Time: 10/22/2016 7:39 AM Performed by: Carleene Cooper A Pre-anesthesia Checklist: Patient identified, Timeout performed, Emergency Drugs available, Suction available and Patient being monitored Patient Re-evaluated:Patient Re-evaluated prior to inductionOxygen Delivery Method: Circle system utilized Preoxygenation: Pre-oxygenation with 100% oxygen Intubation Type: IV induction Ventilation: Mask ventilation without difficulty Laryngoscope Size: Mac and 4 Grade View: Grade I Tube type: Oral Tube size: 7.5 mm Number of attempts: 1 Airway Equipment and Method: Stylet Placement Confirmation: ETT inserted through vocal cords under direct vision,  positive ETCO2 and breath sounds checked- equal and bilateral Secured at: 22 cm Tube secured with: Tape Dental Injury: Teeth and Oropharynx as per pre-operative assessment

## 2016-10-22 NOTE — Brief Op Note (Signed)
10/22/2016  10:02 AM  PATIENT:  Mark Stevenson  61 y.o. 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, L4-5  PROCEDURE:  Procedure(s) with comments: MICRO LUMBAR DECOMPRESSION L3-L4, L4-L5 REVISION  2 LEVELS (N/A) - Requests 150 mins  SURGEON:  Surgeon(s) and Role:    * Susa Day, MD - Primary  PHYSICIAN ASSISTANT:   ASSISTANTS: Bissell   ANESTHESIA:   general  EBL:  Total I/O In: 1000 [I.V.:1000] Out: 800 [Urine:650; Blood:150]  BLOOD ADMINISTERED:none  DRAINS: none   LOCAL MEDICATIONS USED:  MARCAINE     SPECIMEN:  No Specimen  DISPOSITION OF SPECIMEN:  N/A  COUNTS:  YES  TOURNIQUET:  * No tourniquets in log *  DICTATION: .Other Dictation: Dictation Number [id number]  PLAN OF CARE: Admit for overnight observation  PATIENT DISPOSITION:  PACU - hemodynamically stable.   Delay start of Pharmacological VTE agent (>24hrs) due to surgical blood loss or risk of bleeding: yes

## 2016-10-22 NOTE — Interval H&P Note (Signed)
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 After consideration of risks, benefits and other options for treatment, the patient has consented to  Procedure(s) with comments: MICRO LUMBAR DECOMPRESSION L3-L4, L4-L5 REVISION  2 LEVELS (N/A) - Requests 150 mins as a surgical intervention .  The patient's history has been reviewed, patient examined, no change in status, stable for surgery.  I have reviewed the patient's chart and labs.  Questions were answered to the patient's satisfaction.     [name] C

## 2016-10-23 DIAGNOSIS — M48061 Spinal stenosis, lumbar region without neurogenic claudication: Secondary | ICD-10-CM | POA: Diagnosis not present

## 2016-10-23 LAB — BASIC METABOLIC PANEL
ANION GAP: 8 (ref 5–15)
BUN: 13 mg/dL (ref 6–20)
CALCIUM: 8.3 mg/dL — AB (ref 8.9–10.3)
CHLORIDE: 100 mmol/L — AB (ref 101–111)
CO2: 27 mmol/L (ref 22–32)
Creatinine, Ser: 0.87 mg/dL (ref 0.61–1.24)
GFR calc non Af Amer: >60 mL/min (ref >60)
GLUCOSE: 117 mg/dL — AB (ref 65–99)
Potassium: 3.2 mmol/L — ABNORMAL LOW (ref 3.5–5.1)
Sodium: 135 mmol/L (ref 135–145)

## 2016-10-23 LAB — CBC
HEMATOCRIT: 41.3 % (ref 39.0–52.0)
HEMOGLOBIN: 14.2 g/dL (ref 13.0–17.0)
MCH: 33.8 pg (ref 26.0–34.0)
MCHC: 34.4 g/dL (ref 30.0–36.0)
MCV: 98.3 fL (ref 78.0–100.0)
Platelets: 171 10*3/uL (ref 150–400)
RBC: 4.2 MIL/uL — ABNORMAL LOW (ref 4.22–5.81)
RDW: 12.5 % (ref 11.5–15.5)
WBC: 10.5 10*3/uL (ref 4.0–10.5)

## 2016-10-23 NOTE — Evaluation (Signed)
Occupational Therapy Evaluation Patient Details Name: MAJDI MOEDER MRN: [id number] DOB: [birth year] Today's Date: 10/23/2016    History of Present Illness MICRO LUMBAR DECOMPRESSION L3-L4, L4-L5 REVISION  2 LEVELS (N/A)   Clinical Impression   OT education complete regading ADL activity s/p back surgery.    Follow Up Recommendations  No OT follow up    Equipment Recommendations  3 in 1 bedside commode    Recommendations for Other Services       Precautions / Restrictions Precautions Precautions: Back Precaution Comments: reviewed BLT and gave handout      Mobility Bed Mobility Overal bed mobility: Needs Assistance Bed Mobility: Rolling;Sidelying to Sit Rolling: Supervision Sidelying to sit: Supervision       General bed mobility comments: VC for tecnnique  Transfers Overall transfer level: Needs assistance Equipment used: Rolling walker (2 wheeled) Transfers: Sit to/from Stand Sit to Stand: From elevated surface;Min guard         General transfer comment: cues for technique         ADL Overall ADL's : Needs assistance/impaired Eating/Feeding: Set up   Grooming: Set up;Standing;Cueing for safety   Upper Body Bathing: Set up;Sitting   Lower Body Bathing: Minimal assistance;Sit to/from stand;Cueing for safety;Cueing for back precautions;Cueing for sequencing;Cueing for compensatory techniques   Upper Body Dressing : Set up;Sitting   Lower Body Dressing: Minimal assistance;Sit to/from stand;Cueing for back precautions;Cueing for safety;Cueing for sequencing;Cueing for compensatory techniques   Toilet Transfer: Min guard;Cueing for sequencing;Cueing for safety;Comfort height toilet;Ambulation;RW   Toileting- Clothing Manipulation and Hygiene: Supervision/safety;Sit to/from stand;Cueing for safety;Cueing for sequencing;Cueing for compensatory techniques;Cueing for back precautions     Tub/Shower Transfer Details (indicate cue type and reason):  verbalized safety Functional mobility during ADLs: Supervision/safety;Cueing for safety;Rolling walker                 Pertinent Vitals/Pain Pain Score: 2  Pain Location: back surgical site Pain Descriptors / Indicators: Tender Pain Intervention(s): Monitored during session;Repositioned        Extremity/Trunk Assessment         Cervical / Trunk Assessment Cervical / Trunk Assessment: Normal   Communication Communication Communication: No difficulties   Cognition Arousal/Alertness: Awake/alert Behavior During Therapy: WFL for tasks assessed/performed Overall Cognitive Status: Within Functional Limits for tasks assessed                                Home Living Family/patient expects to be discharged to:: Private residence Living Arrangements: Alone Available Help at Discharge: Family;Available PRN/intermittently Type of Home: House Home Access: Stairs to enter [employer] of Steps: 4 Entrance Stairs-Rails: None Home Layout: One level     Bathroom Shower/Tub: Occupational psychologist: Standard     Home Equipment: None          Prior Functioning/Environment Level of Independence: Independent                       OT Goals(Current goals can be found in the care plan section) Acute Rehab OT Goals Patient Stated Goal: to get back to exercise OT Goal Formulation: With patient  OT Frequency:                End of Session Equipment Utilized During Treatment: Surveyor, mining Communication: Mobility status  Activity Tolerance: Patient tolerated treatment well Patient left: in chair;with call bell/phone within reach   Time:  [id number] OT Time Calculation (min): 24 min Charges:  OT General Charges $OT Visit: 1 Procedure OT Evaluation $OT Eval Low Complexity: 1 Procedure OT Treatments $Self Care/Home Management : 8-22 mins G-Codes: OT G-codes **NOT FOR INPATIENT CLASS** Functional Assessment Tool Used:  clinical observation Functional Limitation: Self care Self Care Current Status [id number]): At least 1 percent but less than 20 percent impaired, limited or restricted Self Care Goal Status [id number]): At least 20 percent but less than 40 percent impaired, limited or restricted Self Care Discharge Status [phone]): At least 20 percent but less than 40 percent impaired, limited or restricted  [place], Thereasa Parkin 10/23/2016, 10:51 AM

## 2016-10-23 NOTE — Discharge Summary (Signed)
Physician Discharge Summary   Patient ID: Mark Stevenson MRN: [id number] DOB/AGE: September 22, 1956 61 y.o.  Admit date: 10/22/2016 Discharge date: 10/23/2016  Primary Diagnosis:   Stenosis, HNP L3-4, L4-5  Admission Diagnoses:  Past Medical History:  Diagnosis Date  . Arthritis   . COPD (chronic obstructive pulmonary disease) ([place])   . Dyspnea    increased exertion; pt states can walk a flight of stairs w/o having to stop to catch breath   . Hypertension   . Nicotine dependence, cigarettes, uncomplicated   . Peripheral neuropathy ([place])   . Pure hypercholesterolemia   . Sebaceous cyst    scrotal area   . Viral wart    Discharge Diagnoses:   Principal Problem:   Spinal stenosis of lumbar region  Procedure:  Procedure(s) (LRB): MICRO LUMBAR DECOMPRESSION L3-L4, L4-L5 REVISION  2 LEVELS (N/A)   Consults: None  HPI:  see H&P    Laboratory Data: Hospital Outpatient Visit on 10/17/2016  Component Date Value Ref Range Status  . Sodium 10/17/2016 141  135 - 145 mmol/L Final  . Potassium 10/17/2016 4.2  3.5 - 5.1 mmol/L Final  . Chloride 10/17/2016 106  101 - 111 mmol/L Final  . CO2 10/17/2016 29  22 - 32 mmol/L Final  . Glucose, Bld 10/17/2016 112* 65 - 99 mg/dL Final  . BUN 10/17/2016 18  6 - 20 mg/dL Final  . Creatinine, Ser 10/17/2016 1.17  0.61 - 1.24 mg/dL Final  . Calcium 10/17/2016 9.2  8.9 - 10.3 mg/dL Final  . GFR calc non Af Amer 10/17/2016 >60  >60 mL/min Final  . GFR calc Af Amer 10/17/2016 >60  >60 mL/min Final   Comment: (NOTE) The eGFR has been calculated using the CKD EPI equation. This calculation has not been validated in all clinical situations. eGFR's persistently <60 mL/min signify possible Chronic Kidney Disease.   . Anion gap 10/17/2016 6  5 - 15 Final  . WBC 10/17/2016 8.4  4.0 - 10.5 K/uL Final  . RBC 10/17/2016 4.22  4.22 - 5.81 MIL/uL Final  . Hemoglobin 10/17/2016 14.2  13.0 - 17.0 g/dL Final  . HCT 10/17/2016 40.7  39.0 - 52.0 % Final  .  MCV 10/17/2016 96.4  78.0 - 100.0 fL Final  . MCH 10/17/2016 33.6  26.0 - 34.0 pg Final  . MCHC 10/17/2016 34.9  30.0 - 36.0 g/dL Final  . RDW 10/17/2016 12.4  11.5 - 15.5 % Final  . Platelets 10/17/2016 180  150 - 400 K/uL Final  . ABO/RH(D) 10/17/2016 O POS   Final  . Antibody Screen 10/17/2016 NEG   Final  . Sample Expiration 10/17/2016 10/25/2016   Final  . Extend sample reason 10/17/2016 NO TRANSFUSIONS OR PREGNANCY IN THE PAST 3 MONTHS   Final  . MRSA, PCR 10/17/2016 NEGATIVE  NEGATIVE Final  . Staphylococcus aureus 10/17/2016 POSITIVE* NEGATIVE Final   Comment:        The Xpert SA Assay (FDA approved for NASAL specimens in patients over 24 years of age), is one component of a comprehensive surveillance program.  Test performance has been validated by [facility] for patients greater than or equal to 25 year old. It is not intended to diagnose infection nor to guide or monitor treatment.     Recent Labs  10/23/16 0437  HGB 14.2    Recent Labs  10/23/16 0437  WBC 10.5  RBC 4.20*  HCT 41.3  PLT 171    Recent Labs  10/23/16 0437  NA 135  K 3.2*  CL 100*  CO2 27  BUN 13  CREATININE 0.87  GLUCOSE 117*  CALCIUM 8.3*   No results for input(s): LABPT, INR in the last 72 hours.  X-Rays:Dg Lumbar Spine 2-3 Views  Addendum Date: 10/21/2016   ADDENDUM REPORT: 10/21/2016 06:56 ADDENDUM: The AP view has been labeled. Electronically Signed   By: Marybelle Killings M.D.   On: 10/21/2016 06:56   Result Date: 10/21/2016 CLINICAL DATA:  Preop EXAM: LUMBAR SPINE - 2-3 VIEW COMPARISON:  09/08/2016 FINDINGS: Anatomic alignment from L1 through L5. Stable slight anterolisthesis L5 upon S1. Stable L1 compression deformity. No new compression fracture. Aortic atherosclerotic changes. 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 Left total hip arthroplasty. IMPRESSION: No acute bony pathology.  Chronic changes. Electronically Signed: By: Marybelle Killings M.D. On: 10/17/2016 15:33   Dg  Spine Portable 1 View  Result Date: 10/22/2016 CLINICAL DATA:  Surgical level L3-4, L4-5 EXAM: PORTABLE SPINE - 1 VIEW COMPARISON:  10/17/2016 FINDINGS: Posterior surgical instruments are in place at L3-4, and L4-5. IMPRESSION: Intraoperative localization as above. Electronically Signed   By: Rolm Baptise M.D.   On: 10/22/2016 09:34   Dg Spine Portable 1 View  Result Date: 10/22/2016 CLINICAL DATA:  Surgery at L3-4 and L4-5, for localization EXAM: PORTABLE SPINE - 1 VIEW COMPARISON:  Cross-table lateral image 1 from today FINDINGS: Portable cross-table lateral image 2 from today shows instruments on the spinous processes posteriorly ofl L3 and L4 for localization purposes. IMPRESSION: Clamps on spinous processes posteriorly of L3 and L4. Electronically Signed   By: Ivar Drape M.D.   On: 10/22/2016 08:43   Dg Spine Portable 1 View  Result Date: 10/22/2016 CLINICAL DATA:  Lumbar surgery. EXAM: PORTABLE SPINE - 1 VIEW COMPARISON:  10/17/2016 . FINDINGS: Lumbar vertebra numbered as per prior exam. Metallic markers noted posteriorly at L3 and L5. Diffuse degenerative change. Aortic atherosclerotic vascular disease. Prior left hip replacement. IMPRESSION: Metallic markers noted posteriorly at L3 and L5. Electronically Signed   By: Marcello Moores  Register   On: 10/22/2016 08:19    EKG: Orders placed or performed during the hospital encounter of 04/22/16  . EKG 12-Lead  . EKG 12-Lead     Hospital Course: Patient was admitted to [facility] and taken to the OR and underwent the above state procedure without complications.  Patient tolerated the procedure well and was later transferred to the recovery room and then to the orthopaedic floor for postoperative care.  They were given PO and IV analgesics for pain control following their surgery.  They were given 24 hours of postoperative antibiotics.   PT was consulted postop to assist with mobility and transfers.  The patient was allowed to be WBAT with  therapy and was taught back precautions. Discharge planning was consulted to help with postop disposition and equipment needs.  Patient had a good night on the evening of surgery and started to get up OOB with therapy on day one. Patient was seen in rounds and was ready to go home on day one.  They were given discharge instructions and dressing directions.  They were instructed on when to follow up in the office with Dr. Tonita Cong.   Diet: Regular diet Activity:WBAT; Lspine precautions Follow-up:in 10-14 days Disposition - Home Discharged Condition: good   Discharge Instructions    Call MD / Call 911    Complete by:  As directed    If you experience chest pain or shortness of  breath, CALL 911 and be transported to the hospital emergency room.  If you develope a fever above 101 F, pus (white drainage) or increased drainage or redness at the wound, or calf pain, call your surgeon's office.   Constipation Prevention    Complete by:  As directed    Drink plenty of fluids.  Prune juice may be helpful.  You may use a stool softener, such as Colace (over the counter) 100 mg twice a day.  Use MiraLax (over the counter) for constipation as needed.   Diet - low sodium heart healthy    Complete by:  As directed    Increase activity slowly as tolerated    Complete by:  As directed      Allergies as of 10/23/2016   No Known Allergies     Medication List    STOP taking these medications   cilostazol 100 MG tablet Commonly known as:  PLETAL   lovastatin 20 MG tablet Commonly known as:  MEVACOR     TAKE these medications   ADVAIR DISKUS 250-50 MCG/DOSE Aepb Generic drug:  Fluticasone-Salmeterol Inhale 1 puff into the lungs 2 (two) times daily.   docusate sodium 100 MG capsule Commonly known as:  COLACE Take 1 capsule (100 mg total) by mouth 2 (two) times daily as needed for mild constipation.   gabapentin 600 MG tablet Commonly known as:  NEURONTIN Take 600 mg by mouth at bedtime.     losartan 100 MG tablet Commonly known as:  COZAAR Take 100 mg by mouth daily.   oxyCODONE-acetaminophen 5-325 MG tablet Commonly known as:  PERCOCET Take 1-2 tablets by mouth every 4 (four) hours as needed for severe pain.   polyethylene glycol packet Commonly known as:  MIRALAX / GLYCOLAX Take 17 g by mouth daily.   triamcinolone cream 0.1 % Commonly known as:  KENALOG Apply 1 application topically daily as needed for rash.   triamterene-hydrochlorothiazide 37.5-25 MG tablet Commonly known as:  MAXZIDE-25 Take 1 tablet by mouth daily.   Vitamin D3 2000 units Tabs Take 2,000 Units by mouth daily.      Follow-up Information    BEANE,JEFFREY C, MD Follow up in 2 week(s).   Specialty:  Orthopedic Surgery Contact information: [address] [place] [address] [phone]           Signed: Lacie Draft, PA-C Orthopaedic Surgery 10/23/2016, 8:31 AM

## 2016-10-23 NOTE — Progress Notes (Signed)
Subjective: 1 Day Post-Op Procedure(s) (LRB): MICRO LUMBAR DECOMPRESSION L3-L4, L4-L5 REVISION  2 LEVELS (N/A) Patient reports pain as 3 on 0-10 scale.   Leg better Objective: Vital signs in last 24 hours: Temp:  [98.1 F (36.7 C)-99 F (37.2 C)] 98.6 F (37 C) (01/25 0546) Pulse Rate:  [70-91] 76 (01/25 0546) Resp:  [16-24] 16 (01/25 0546) BP: (110-154)/(69-97) 154/89 (01/25 0546) SpO2:  [95 %-100 %] 100 % (01/25 0546) Weight:  [97.5 kg (215 lb)] 97.5 kg (215 lb) (01/24 1145)  Intake/Output from previous day: 01/24 0701 - 01/25 0700 In: 3537.5 [P.O.:840; I.V.:2442.5; IV Piggyback:255] Out: 2450 [Urine:2300; Blood:150] Intake/Output this shift: No intake/output data recorded.   Recent Labs  10/23/16 0437  HGB 14.2    Recent Labs  10/23/16 0437  WBC 10.5  RBC 4.20*  HCT 41.3  PLT 171    Recent Labs  10/23/16 0437  NA 135  K 3.2*  CL 100*  CO2 27  BUN 13  CREATININE 0.87  GLUCOSE 117*  CALCIUM 8.3*   No results for input(s): LABPT, INR in the last 72 hours.  Neurologically intact Neurovascular intact Intact pulses distally Incision: dressing C/D/I  Assessment/Plan: 1 Day Post-Op Procedure(s) (LRB): MICRO LUMBAR DECOMPRESSION L3-L4, L4-L5 REVISION  2 LEVELS (N/A) Advance diet Up with therapy D/C IV fluids Discharge home with home health  Mark Stevenson C 10/23/2016, 7:27 AM

## 2016-10-23 NOTE — Care Management Note (Signed)
Case Management Note  Patient Details  Name: Mark Stevenson MRN: [id number] Date of Birth: 10-Dec-1955  Subjective/Objective:                  MICRO LUMBAR DECOMPRESSION L3-L4, L4-L5 REVISION  2 LEVELS (N/A) Action/Plan: Discharge planning Expected Discharge Date:  10/23/16               Expected Discharge Plan:  Home/Self Care  In-House Referral:     Discharge planning Services  CM Consult  Post Acute Care Choice:  NA Choice offered to:  Patient  DME Arranged:  3-N-1, Walker rolling DME Agency:  [place]:    [place]:  NA  Status of Service:  Completed, signed off  If discussed at [place] of Stay Meetings, dates discussed:    Additional Comments: CM met with pt to assess for home needs.  CM notified [place] DME rep, brad to please deliver the rolling walker and 3n1 to room prior to discharge. No other CM needs were communicated. Dellie Catholic, RN 10/23/2016, 12:07 PM

## 2016-10-23 NOTE — Progress Notes (Signed)
Physical Therapy Treatment Patient Details Name: Mark Stevenson MRN: [id number] DOB: 1956/08/31 Today's Date: 10/23/2016    History of Present Illness MICRO LUMBAR DECOMPRESSION L3-L4, L4-L5 REVISION  2 LEVELS (N/A)    PT Comments    Ready for DC.  Follow Up Recommendations  Supervision/Assistance - 24 hour;No PT follow up     Equipment Recommendations  Rolling walker with 5" wheels    Recommendations for Other Services       Precautions / Restrictions Precautions Precautions: Back    Mobility  Bed Mobility               General bed mobility comments: oob  Transfers   Equipment used: Rolling walker (2 wheeled) Transfers: Sit to/from Stand Sit to Stand: From elevated surface;Supervision         General transfer comment: cues for technique  Ambulation/Gait Ambulation/Gait assistance: Supervision Ambulation Distance (Feet): 200 Feet Assistive device: Rolling walker (2 wheeled) Gait Pattern/deviations: Step-through pattern         Stairs Stairs: Yes   Stair Management: Two rails Number of Stairs: 2 General stair comments: patient has no rails but patient used them. Stated that he could get up the steps.  Wheelchair Mobility    Modified Rankin (Stroke Patients Only)       Balance                                    Cognition Arousal/Alertness: Awake/alert                          Exercises      General Comments        Pertinent Vitals/Pain Pain Score: 2  Pain Location: back surgical site Pain Descriptors / Indicators: Tender Pain Intervention(s): Premedicated before session    Home Living                      Prior Function            PT Goals (current goals can now be found in the care plan section) Progress towards PT goals: Progressing toward goals    Frequency           PT Plan Current plan remains appropriate    Co-evaluation             End of Session   Activity  Tolerance: Patient tolerated treatment well Patient left: in chair;with family/visitor present     Time: 1131-1141 PT Time Calculation (min) (ACUTE ONLY): 10 min  Charges:  $Gait Training: 8-22 mins                    G Codes:      [name] 10/23/2016, 4:58 PM

## 2016-10-23 NOTE — Op Note (Signed)
NAMEHARBOR, BARRERAS NO.:  [id number]  MEDICAL RECORD NO.:  [id number]  LOCATION:                                 FACILITY:  PHYSICIAN:  Susa Day, M.D.    DATE OF BIRTH:  [birth year]  DATE OF PROCEDURE:  10/22/2016 DATE OF DISCHARGE:                              OPERATIVE REPORT   PREOPERATIVE DIAGNOSES: 1. Spinal stenosis, recurrent at L3-4, L4-5. 2. Disk herniation, 3-4.  POSTOPERATIVE DIAGNOSES: 1. Spinal stenosis, recurrent at L3-4, L4-5. 2. Disk herniation, 3-4.  PROCEDURE PERFORMED: 1. Revision microlumbar decompression L3-4 bilaterally. 2. Microlumbar decompression bilaterally at L4-5. 3. Gill laminectomy of L4.  ANESTHESIA:  General.  ASSISTANT:  Cleophas Dunker, PA  HISTORY:  This 61 year old male who has had severe left lower extremity radicular pain and myelogram indicating recurrent disk herniation 3-4, lateral recess stenosis and compression of the 4 root. He also had a lateral recess stenosis at 4-5, compression of the 5 root by myelogram, history of lumbar decompression centrally at 3-4 and further settling with neural foraminal narrowing and stenosis.  He was indicated for microlumbar decompression, revision at 3-4, decompression at 4-5, predominantly the left, but he also had right-sided symptoms.  Risk and benefits discussed including bleeding, infection, damage to neurovascular structures, no change in symptoms, worsening symptoms, DVT, PE, anesthetic complications, etc.  TECHNIQUE:  With the patient in supine position, after induction of adequate anesthesia and 2 g Kefzol, placed prone on Andrews frame.  All bony prominences were well padded.  Lumbar region was prepped and draped in usual sterile fashion.  Two 18-gauge needles were utilized to localize the surgical incision was made from the spinous process of 3 to just below 5.  I made incision utilizing the previous surgical scar and this was excised.  Subcutaneous  tissue was dissected.  Electrocautery was utilized to achieve hemostasis.  Dorsolumbar fascia identified via line of skin incision.  Paraspinous muscles were infiltrated with lidocaine and Marcaine.  We used a 2% lidocaine, epinephrine, and then some Marcaine with that as well.  Paraspinous muscle was elevated.  We detected the retractor.  Operating microscope was draped brought on the surgical field removing the spinous process of 4, partial of 5.  We turned our attention to 4, detached ligamentum flavum from the caudad edge of 4 utilizing a straight and micro curette.  Also detached it from the cephalad edge of 5.  There was hypertrophic ligamentum flavum noted and facet hypertrophy.  We used 2 mm Kerrison, performed hemilaminotomy, caudad edge of 4 bilaterally and detached ligamentum flavum from the cephalad edge of 5.  Ligamentum flavum removed from the interspace. Placed a neuro patty though beneath the ligamentum flavum.  Severe bilateral lateral recess stenosis was noted.  We decompressed the lateral recess to the medial border of the pedicle bilaterally protecting the neural elements with a __________ and a Woodson retractor, performed generous foraminotomies of 5.  There was stenosis noted bilaterally at 5, no disk herniation.  Foraminotomies of 4 was performed as well.  We then continued cephalad to remove the entire lamina of 4 utilizing a micro curette and developing a meticulous plane between the thecal sac, the scar tissue  from the previous procedure and the lamina.  There was a very small interlaminar window at 3-4.  We removed the ligamentum flavum, performed hemilaminotomies of the caudad edge of 3 bilaterally with a straight micro curet and 3 mm Kerrison.  We meticulously then developed a plane between the epidural fibrosis on the left, the disk space and the L3 and L4 nerve root.  We performed foraminotomies of 3 and 4, decompressed lateral recess to medial border of  the pedicle.  Disc herniation of the synovial cyst from the facet at 3-4 was removed.  There was epidural fibrosis encasing the root.  We freed the L4 and L5 nerve root.  Confirmatory radiograph obtained identifying 4-5 and 3-4.  Decompressed the lateral recess on the right as well.  Not a stenotic.  We used bone wax to control cancellous bleeding, bipolar cautery, and thrombin-soaked Gelfoam.  No probe passed freely at the foramen of 3, 4, and 5 bilaterally.  No specimen.  A 1 cm excursion of the __________ medial pedicle without tension.  No evidence of CSF leakage or active bleeding.  We had good restoration of thecal sac.  Next, we copiously irrigated the wound, placed thrombin-soaked Gelfoam with laminotomy defect.  I removed McCullough retractor, inspected paraspinous musculature, no active bleeding.  Copiously irrigated the wound with antibiotic irrigation.  We closed the dorsolumbar fascia with 1-0 Vicryl, subcu with 2-0, and skin with staples.  Wound was dressed sterilely.  Placed supine on the hospital bed, extubated without difficulty, and transported to the recovery room in satisfactory condition.  The patient tolerated the procedure well.  No complications.  Assistant __________ positioning, gentle intermittent neural traction, suction, closure.     Susa Day, M.D.   ______________________________ Susa Day, M.D.    Geralynn Rile  D:  10/22/2016  T:  10/23/2016  Job:  [id number]

## 2016-10-27 DIAGNOSIS — Z96649 Presence of unspecified artificial hip joint: Secondary | ICD-10-CM | POA: Diagnosis not present

## 2016-10-27 DIAGNOSIS — R269 Unspecified abnormalities of gait and mobility: Secondary | ICD-10-CM | POA: Diagnosis not present

## 2016-10-27 DIAGNOSIS — M4807 Spinal stenosis, lumbosacral region: Secondary | ICD-10-CM | POA: Diagnosis not present

## 2016-11-11 ENCOUNTER — Encounter: Payer: Self-pay | Admitting: Physical Therapy

## 2016-11-11 ENCOUNTER — Ambulatory Visit: Payer: 59 | Attending: Specialist | Admitting: Physical Therapy

## 2016-11-11 DIAGNOSIS — G8929 Other chronic pain: Secondary | ICD-10-CM | POA: Diagnosis present

## 2016-11-11 DIAGNOSIS — M545 Low back pain: Secondary | ICD-10-CM | POA: Insufficient documentation

## 2016-11-11 DIAGNOSIS — M6281 Muscle weakness (generalized): Secondary | ICD-10-CM | POA: Diagnosis not present

## 2016-11-11 NOTE — Therapy (Signed)
[facility] [address] [place], [place], [address] Phone: [phone]   Fax:  [phone]  Physical Therapy Evaluation  Patient Details  Name: Mark Stevenson MRN: [id number] Date of Birth: 28-Oct-1955 Referring Provider: Johnn Hai   Encounter Date: 11/11/2016      PT End of Session - 11/11/16 1558    Visit Number 1   Number of Visits 8   Date for PT Re-Evaluation 12/09/16   PT Start Time [id number]   PT Stop Time 1512   PT Time Calculation (min) 58 min   Activity Tolerance Patient tolerated treatment well   Behavior During Therapy [facility] for tasks assessed/performed      Past Medical History:  Diagnosis Date  . Arthritis   . COPD (chronic obstructive pulmonary disease) ([place])   . Dyspnea    increased exertion; pt states can walk a flight of stairs w/o having to stop to catch breath   . Hypertension   . Nicotine dependence, cigarettes, uncomplicated   . Peripheral neuropathy ([place])   . Pure hypercholesterolemia   . Sebaceous cyst    scrotal area   . Viral wart     Past Surgical History:  Procedure Laterality Date  . ankles     tarsal tunnel surgery bilat  . BACK SURGERY    . broken knee cap    . crushed L-4 vertebrae  2008  . KNEE ARTHROSCOPY     x3 bilat  . LUMBAR LAMINECTOMY/DECOMPRESSION MICRODISCECTOMY N/A 10/22/2016   Procedure: MICRO LUMBAR DECOMPRESSION L3-L4, L4-L5 REVISION  2 LEVELS;  Surgeon: Susa Day, MD;  [facility]: [facility];  Service: Orthopedics;  Laterality: N/A;  Requests 150 mins  . PERIPHERAL VASCULAR CATHETERIZATION N/A 04/22/2016   Procedure: Abdominal Aortogram w/ bilateral Lower Extremity Runoff;  Surgeon: Serafina Mitchell, MD;  Location: [place] [facility];  Service: Cardiovascular;  Laterality: N/A;  . PERIPHERAL VASCULAR CATHETERIZATION Left 04/22/2016   Procedure: Peripheral Vascular Atherectomy;  Surgeon: Serafina Mitchell, MD;  Location: [place] [facility];  Service: Cardiovascular;   Laterality: Left;  . ruptured disc    . tendons in calves lengthened     plantar fascitis  . TOTAL HIP ARTHROPLASTY  2012    There were no vitals filed for this visit.       Subjective Assessment - 11/11/16 1548    Subjective Pt. presented to physical therapy s/p lumbar decompression surgery on 10/22/16. Pt reported no pain today but reports 5/10 at worst. Pt. is currently a 63 yr employee of [employer] and is nearing retirement. Pt. used to be walking at the gym with a core program and experienced onset of back pain after he stopped going to the gym.    Limitations Lifting;Standing;Walking;House hold activities   Patient Stated Goals Pt. would like to return to a walking program/gym-based program with decreased pain.    Currently in Pain? No/denies     OBJECTIVE:  Pt. Instructed on first 2 pages of core stabilization program and demonstrated understanding with TA contraction and able to sustain during supine exercises. Pt. Will be progressed through supine and quadruped core stabilization exercises as well as endurance training to promote return to exercise and daily activity.   Pt will benefit from skilled PT in order to improve ROM of lumbar region and stability of core to decrease pain in low back and hip regions.    mODI to be assessed next visit.       Plan -  11/11/16 1559    Clinical Impression Statement Pt. is a pleasant 61 y/o male s/p lumbar discectomy 10/22/16 with hx of multiple orthopedic surgeries in the past (see medical hx). Pt. is not experiencing pain today but has difficulty with getting out of the house to go to the grocery store, etc. and reports 5/10 pain at worst in low back and in B hips (lateral). AROM: WFL and slight muscular pulling with SB and Rot. LE strength: 5/5 MMT grossly except for B weak hip flexors (4-/5 bilat.). Pt. has good B hamstring and piriformis length in supine with no pain/pulling with supine lumbar rotation stretch. In prone, pt. assessed  for mobility and had no tenderness or pain with Grade I/II CPAs/UPAs. Hypomobility noted in lumbar spine with scar still intact and healing, no signs of infection. Pt. instructed on first 2 pages of core stabilization program with moderate cuing to facilitate pt. contraction of TA. Pt. able to demonstrate understand after explanation. Pt. will benefit from skilled PT in order to increase pain-free mobility and stability to promote return to exercise program and normal daily activities.    Rehab Potential Fair   Clinical Impairments Affecting Rehab Potential chronic hx of back pain, multiple surgeries, but motivated   PT Frequency 2x / week   PT Duration 4 weeks   PT Treatment/Interventions ADLs/Self Care Home Management;Cryotherapy;Dentist;Therapeutic activities;Functional mobility training;Therapeutic exercise;Balance training;Neuromuscular re-education;Patient/family education;Manual techniques;Passive range of motion   PT Next Visit Plan progress HEP, assess bike/walking    PT Home Exercise Plan see handout   Consulted and Agree with Plan of Care Patient      Patient will benefit from skilled therapeutic intervention in order to improve the following deficits and impairments:  Abnormal gait, Decreased balance, Decreased endurance, Decreased mobility, Difficulty walking, Hypomobility, Decreased range of motion, Decreased scar mobility, Improper body mechanics, Decreased activity tolerance, Decreased strength, Impaired flexibility, Postural dysfunction, Pain  Visit Diagnosis: Muscle weakness (generalized)  Chronic midline low back pain without sciatica     Problem List Patient Active Problem List   Diagnosis Date Noted  . Spinal stenosis of lumbar region 10/22/2016   Pura Spice, PT, DPT # [phone] Willodean Rosenthal, SPT 11/12/2016, 11:52 AM   [facility] [address] [place],  [place], [address] Phone: [phone]   Fax:  [phone]  Name: Mark Stevenson MRN: [id number] Date of Birth: [birth year]

## 2016-11-11 NOTE — Patient Instructions (Addendum)
MAT PROGRESSION Supine Transverse Abdominus (T.A) . Lie on your back with your knees bent, feet flat on the floor.  Pull your belly-button inwards towards your spine to activate the deep abdominal musculature.  Hold ____ sec./ ____ repetitions/_____sets.  CAUTION:  If you find yourself holding your breath you may be activating other muscles in the abdomen that do not require strengthening; relax and try again.    Supine Posterior Pelvic Tilt with T.A . Lie on your back with your knees bent, feet flat on the floor.  Pull your belly-button inwards towards your spine to activate the deep abdominal musculature then curl your hips underneath your body so your back is flat against the table.  Hold ____ sec./ ____ repetitions/_____sets.      Supine heel slide with T.A . Lie on your back with your knees bent, feet flat on the floor.  Pull your belly-button inwards towards your spine to activate the deep abdominal musculature.  Slide one heel along the table straightening the knee while keeping your abdominals tight, back flat against the table, and no movement at the hips/pelvis; return to the starting position.  Repeat with the opposite lower extremity.   ____ repetitions/_____sets.         Supine march with T.A . Lie on your back with your knees bent, feet flat on the floor.  Pull your belly-button inwards towards your spine to activate the deep abdominal musculature.  While maintaining your abdominal contraction, slowly lift one leg off the table no more than 3 inches; return to the starting position.  Repeat with the opposite leg.   ____ repetitions/_____sets.       Supine bridge with T.A . Lie on your back with your knees bent, feet flat on the floor.  Pull your belly-button inwards towards your spine to activate the deep abdominal musculature.  Slowly lift your hips and buttocks off the table keeping your abdominal muscles tight.  Hold ___ sec/____reps/____sets  Supine beginning bike with  T.A . Lie on your back with your knees bent, feet flat on the floor.  Pull your belly-button inwards towards your spine to activate the deep abdominal musculature.  Slowly lift the right leg in the bent position, perform a small circle as if riding a bicycle; return the right leg back to the starting position.  Repeat on the opposite leg.   Perform ___ reps/___sets.

## 2016-11-14 ENCOUNTER — Ambulatory Visit: Payer: 59 | Admitting: Physical Therapy

## 2016-11-14 DIAGNOSIS — G8929 Other chronic pain: Secondary | ICD-10-CM

## 2016-11-14 DIAGNOSIS — M6281 Muscle weakness (generalized): Secondary | ICD-10-CM | POA: Diagnosis not present

## 2016-11-14 DIAGNOSIS — M545 Low back pain: Secondary | ICD-10-CM

## 2016-11-14 NOTE — Therapy (Signed)
[facility] [address]. [place], [place], [address] Phone: [phone]   Fax:  [phone]  Physical Therapy Treatment  Patient Details  Name: OBADIAH SICONOLFI MRN: [id number] Date of Birth: [birth year] Referring Provider: Johnn Hai   Encounter Date: 11/14/2016      PT End of Session - 11/14/16 1231    Visit Number 2   Number of Visits 8   Date for PT Re-Evaluation 12/09/16   PT Start Time 1109   PT Stop Time 1202   PT Time Calculation (min) 53 min   Activity Tolerance Patient tolerated treatment well   Behavior During Therapy [facility] for tasks assessed/performed      Past Medical History:  Diagnosis Date  . Arthritis   . COPD (chronic obstructive pulmonary disease) ([place])   . Dyspnea    increased exertion; pt states can walk a flight of stairs w/o having to stop to catch breath   . Hypertension   . Nicotine dependence, cigarettes, uncomplicated   . Peripheral neuropathy ([place])   . Pure hypercholesterolemia   . Sebaceous cyst    scrotal area   . Viral wart     Past Surgical History:  Procedure Laterality Date  . ankles     tarsal tunnel surgery bilat  . BACK SURGERY    . broken knee cap    . crushed L-4 vertebrae  2008  . KNEE ARTHROSCOPY     x3 bilat  . LUMBAR LAMINECTOMY/DECOMPRESSION MICRODISCECTOMY N/A 10/22/2016   Procedure: MICRO LUMBAR DECOMPRESSION L3-L4, L4-L5 REVISION  2 LEVELS;  Surgeon: Susa Day, MD;  [facility];  Service: Orthopedics;  Laterality: N/A;  Requests 150 mins  . PERIPHERAL VASCULAR CATHETERIZATION N/A 04/22/2016   Procedure: Abdominal Aortogram w/ bilateral Lower Extremity Runoff;  Surgeon: Serafina Mitchell, MD;  Location: [place] [facility];  Service: Cardiovascular;  Laterality: N/A;  . PERIPHERAL VASCULAR CATHETERIZATION Left 04/22/2016   Procedure: Peripheral Vascular Atherectomy;  Surgeon: Serafina Mitchell, MD;  Location: [place] [facility];  Service: Cardiovascular;   Laterality: Left;  . ruptured disc    . tendons in calves lengthened     plantar fascitis  . TOTAL HIP ARTHROPLASTY  2012    There were no vitals filed for this visit.      Subjective Assessment - 11/14/16 1229    Subjective Pt. presented to PT clinic today with 2/10 low back pain and reports HEP went well at home with no increase in pain.    Limitations Lifting;Standing;Walking;House hold activities   Patient Stated Goals Pt. would like to return to a walking program/gym-based program with decreased pain.    Currently in Pain? Yes   Pain Score 2    Pain Location Back   Pain Orientation Mid   Pain Frequency Intermittent      OBJECTIVE:  Manual tx: supine position: Hamstring stretch R/L 30 sec. Static holds 3x each. No increased pain but B hamstrings limited from last tx. Piriformis stretch (figure 4) w/ PT OP for added stretch. Hip flexion 30 sec. Holds R/L w/ hip int./ext. Rotation assesed: int. Rotation limited bilat. LLD assessment in supine: symmetrical. Scar assessment: healing well, no scabs intact today. Began scar massage.   There.Ex: Scifit L6 7 min (warm-up/no charge) w/ focus on core activation. L lateral hip pain after 7 min. Supine core stabilization progression: review of HEP (pgs 1&2) and addition of pgs 3&4 after proper form demonstrated. Pt. Had no increased back/hip  pain with core exercise and proper technique reinforced for HEP. 5 min walking on treadmill 2.5 mph. Gait pattern improves with quicker pace and hand rail support.    Good overall tx. Session today with progression of core stabilization HEP. Will progress endurance exercise and LE strengthening next tx. Pt. Will benefit from skilled PT to increase core strength, LE strength, and improve endurance to promote return to functional mobility and pain-free hobbies such as fishing.         PT Education - 11/14/16 1230    Education provided Yes   Education Details pgs. 3/4 of core stab. program   Person(s)  Educated Patient   Methods Explanation;Demonstration;Handout   Comprehension Verbalized understanding;Returned demonstration             PT Long Term Goals - 11/12/16 0836      PT LONG TERM GOAL #1   Title Pt. will increase B hip flexion to 4+/5 MMT in order to promote return to a walking program.    Baseline 4-/5 B hip flexion   Time 4   Period Weeks   Status New     PT LONG TERM GOAL #2   Title Pt. will complete modified Oswestry Disability Index and score <20% to indicate minimal perceived disability   Baseline Pt. has not completed mODI    Time 4   Period Weeks   Status New     PT LONG TERM GOAL #3   Title Pt. will report no increased low back or hip pain while ambulating in a grocery store to promote return to normal daily activities.   Baseline Pt. reports he is currently unable to complete shopping tasks without severe pain   Time 4   Period Weeks   Status New     PT LONG TERM GOAL #4   Title Pt. will return to a gym-based walking program in order to return to previous fitness levels prior to onset of back pain.   Baseline Pt. not currently able to exercise due to pain   Time 4   Period Weeks   Status New               Plan - 11/14/16 1231    Clinical Impression Statement Pt. presented to physical therapy today with 2/10 mid low-back pain and occasional L sided aching in lateral hip mostly brought on by walking. Pt. completed 7 min on the SciFit bike today before pain onset and 5 minutes of walking on the treadmill before L hip ache. Pt. demonstrated proper form during supine core stabilization exerises and was progressed through more advanced exercises to add at home. Pt. tolerated supine stretching exercises well with hamstring tightness noted. Pt. ambulates with a L-sided antalgic gait at slower speeds with increased pain and prefers to move at quicker pace. Pt. has increased weakness in L hip/glute and will benefit from conntinued skilled physical therapy  to decrease pain, increase functional mobility and promote pain-free gait.   Rehab Potential Fair   Clinical Impairments Affecting Rehab Potential chronic hx of back pain, multiple surgeries, but motivated   PT Frequency 2x / week   PT Duration 4 weeks   PT Treatment/Interventions ADLs/Self Care Home Management;Cryotherapy;Dentist;Therapeutic activities;Functional mobility training;Therapeutic exercise;Balance training;Neuromuscular re-education;Patient/family education;Manual techniques;Passive range of motion   PT Next Visit Plan progress HEP, standing LE strengthening    PT Home Exercise Plan see handout   Consulted and Agree with Plan of Care Patient  Patient will benefit from skilled therapeutic intervention in order to improve the following deficits and impairments:  Abnormal gait, Decreased balance, Decreased endurance, Decreased mobility, Difficulty walking, Hypomobility, Decreased range of motion, Decreased scar mobility, Improper body mechanics, Decreased activity tolerance, Decreased strength, Impaired flexibility, Postural dysfunction, Pain  Visit Diagnosis: Muscle weakness (generalized)  Chronic midline low back pain without sciatica     Problem List Patient Active Problem List   Diagnosis Date Noted  . Spinal stenosis of lumbar region 10/22/2016   Pura Spice, PT, DPT # [phone] Willodean Rosenthal, SPT 11/14/2016, 12:40 PM  [facility] [address] [place], [place], [address] Phone: [phone]   Fax:  [phone]  Name: KYLLIAN ERRANTE MRN: [id number] Date of Birth: 16-May-1956

## 2016-11-17 ENCOUNTER — Ambulatory Visit: Payer: 59 | Admitting: Physical Therapy

## 2016-11-17 DIAGNOSIS — G8929 Other chronic pain: Secondary | ICD-10-CM

## 2016-11-17 DIAGNOSIS — M6281 Muscle weakness (generalized): Secondary | ICD-10-CM | POA: Diagnosis not present

## 2016-11-17 DIAGNOSIS — M545 Low back pain, unspecified: Secondary | ICD-10-CM

## 2016-11-17 NOTE — Therapy (Signed)
[facility] [address] [place], [place], [address] Phone: [phone]   Fax:  [phone]  Physical Therapy Treatment  Patient Details  Name: Mark Stevenson MRN: [id number] Date of Birth: November 30, 1955 Referring Provider: Johnn Hai   Encounter Date: 11/17/2016      PT End of Session - 11/17/16 1151    Visit Number 3   Number of Visits 8   Date for PT Re-Evaluation 12/09/16   PT Start Time 0946   PT Stop Time 1042   PT Time Calculation (min) 56 min   Activity Tolerance Patient tolerated treatment well   Behavior During Therapy [facility] for tasks assessed/performed      Past Medical History:  Diagnosis Date  . Arthritis   . COPD (chronic obstructive pulmonary disease) ([place])   . Dyspnea    increased exertion; pt states can walk a flight of stairs w/o having to stop to catch breath   . Hypertension   . Nicotine dependence, cigarettes, uncomplicated   . Peripheral neuropathy ([place])   . Pure hypercholesterolemia   . Sebaceous cyst    scrotal area   . Viral wart     Past Surgical History:  Procedure Laterality Date  . ankles     tarsal tunnel surgery bilat  . BACK SURGERY    . broken knee cap    . crushed L-4 vertebrae  2008  . KNEE ARTHROSCOPY     x3 bilat  . LUMBAR LAMINECTOMY/DECOMPRESSION MICRODISCECTOMY N/A 10/22/2016   Procedure: MICRO LUMBAR DECOMPRESSION L3-L4, L4-L5 REVISION  2 LEVELS;  Surgeon: Susa Day, MD;  [facility];  Service: Orthopedics;  Laterality: N/A;  Requests 150 mins  . PERIPHERAL VASCULAR CATHETERIZATION N/A 04/22/2016   Procedure: Abdominal Aortogram w/ bilateral Lower Extremity Runoff;  Surgeon: Serafina Mitchell, MD;  Location: [place] [facility];  Service: Cardiovascular;  Laterality: N/A;  . PERIPHERAL VASCULAR CATHETERIZATION Left 04/22/2016   Procedure: Peripheral Vascular Atherectomy;  Surgeon: Serafina Mitchell, MD;  Location: [place] [facility];  Service: Cardiovascular;   Laterality: Left;  . ruptured disc    . tendons in calves lengthened     plantar fascitis  . TOTAL HIP ARTHROPLASTY  2012    There were no vitals filed for this visit.      Subjective Assessment - 11/17/16 1148    Subjective Pt. presented to PT today with some low back/L hip achiness due to changing weather. Pt. reports compliance with HEP. Pt. is going to the beach for a few days this week.    Limitations Lifting;Standing;Walking;House hold activities   Patient Stated Goals Pt. would like to return to a walking program/gym-based program with decreased pain.    Currently in Pain? No/denies      OBJECTIVE:  There.Ex.: SciFit L4 10 min. (warm-up/no charge) w/ focus on core activation and good posture. No increase in pain today w/ increased time.  Partial squats w/ rail support focusing on good spine alignment and instruction on knee alignment staying midline. TG squats/heel raises 15x2 each. Pt. Provided cuing to slow speed of exercises and maintain midline patellar alignment. Good patellar tracking noted.  Step-ups on 6" step B UE support on rails 15x R/L followed by 12" step-ups. Pt. Is significantly weaker on LLE than RLE/. Standing hamstring/calf stretch 30 sec. Static holds.  Manual tx: supine hamstring, calf, piriformis figure 4 stretch 30 sec. Static holds. 5x each. Pt. Is demonstrating improved hamstring length from last tx with  carryover noted (~5 deg.). HEP reviewed in supine. Pt. Instructed to continue with core HEP into pain-free ranges.   Good tx. Today with standing LE strengthening session today. Pt. Will benefit from continued gross LE strength with focus on core activation and maintaining proper body mechanics to progress towards pain-free mobility and ambulation.         PT Long Term Goals - 11/17/16 1217      PT LONG TERM GOAL #1   Title Pt. will increase B hip flexion to 4+/5 MMT in order to promote return to a walking program.    Baseline 4-/5 B hip flexion    Time 4   Period Weeks   Status On-going     PT LONG TERM GOAL #2   Title Pt. will complete modified Oswestry Disability Index and score <20% to indicate minimal perceived disability   Baseline 26% on 10/1916   Time 4   Period Weeks   Status Not Met     PT LONG TERM GOAL #3   Title Pt. will report no increased low back or hip pain while ambulating in a grocery store to promote return to normal daily activities.   Baseline Pt. reports he is currently unable to complete shopping tasks without severe pain   Time 4   Period Weeks   Status On-going     PT LONG TERM GOAL #4   Title Pt. will return to a gym-based walking program in order to return to previous fitness levels prior to onset of back pain.   Baseline Pt. not currently able to exercise due to pain   Time 4   Period Weeks   Status On-going           Plan - 11/17/16 1152    Clinical Impression Statement Pt. presented to physical therapy today with no low back pain and mild aching in L hip/glutes. Pt. completed mODI: 26% = moderate self-percived disability. Pt. completed Scifit for 10 minutes today with no increase in pain focusing on abdominal activation and proper posture. Pt. progressed through standing LE strengthening exercises with instruction on proper hip and knee alignment during partial squats and walking lunges. Pt.'s incision continuing to heal well with no scabs intact. Pt. instructed to continue supine core stabilization program as long as no increased back pain remains consistent. Pt. will benfit from continued skilled PT to increase core and gross LE stability to promote pain-free ambulation and RTW (date unknown).    Rehab Potential Fair   Clinical Impairments Affecting Rehab Potential chronic hx of back pain, multiple surgeries, but motivated   PT Frequency 2x / week   PT Duration 4 weeks   PT Treatment/Interventions ADLs/Self Care Home Management;Cryotherapy;Psychologist, forensic;Therapeutic activities;Functional mobility training;Therapeutic exercise;Balance training;Neuromuscular re-education;Patient/family education;Manual techniques;Passive range of motion   PT Next Visit Plan progress HEP, standing LE strengthening, STM to back incision area    PT Home Exercise Plan see handout   Consulted and Agree with Plan of Care Patient      Patient will benefit from skilled therapeutic intervention in order to improve the following deficits and impairments:  Abnormal gait, Decreased balance, Decreased endurance, Decreased mobility, Difficulty walking, Hypomobility, Decreased range of motion, Decreased scar mobility, Improper body mechanics, Decreased activity tolerance, Decreased strength, Impaired flexibility, Postural dysfunction, Pain  Visit Diagnosis: Muscle weakness (generalized)  Chronic midline low back pain without sciatica     Problem List Patient Active Problem List   Diagnosis Date Noted  .  Spinal stenosis of lumbar region 10/22/2016   Pura Spice, PT, DPT # [phone] Willodean Rosenthal, SPT  11/17/2016, 1:27 PM  [facility] [address] [place], [place], [address] Phone: [phone]   Fax:  [phone]  Name: Mark Stevenson MRN: [id number] Date of Birth: [birth year]

## 2016-11-24 ENCOUNTER — Encounter: Payer: 59 | Admitting: Physical Therapy

## 2016-11-25 ENCOUNTER — Encounter: Payer: Self-pay | Admitting: Surgery

## 2016-11-26 ENCOUNTER — Ambulatory Visit: Payer: 59 | Admitting: Physical Therapy

## 2016-12-01 ENCOUNTER — Ambulatory Visit (HOSPITAL_COMMUNITY)
Admission: RE | Admit: 2016-12-01 | Discharge: 2016-12-01 | Disposition: A | Discharge status: Home / Self Care | Payer: 59 | Source: Ambulatory Visit | Attending: Surgery | Admitting: Surgery

## 2016-12-01 ENCOUNTER — Ambulatory Visit (INDEPENDENT_AMBULATORY_CARE_PROVIDER_SITE_OTHER): Payer: 59 | Admitting: Physician Assistant

## 2016-12-01 ENCOUNTER — Ambulatory Visit: Payer: 59 | Attending: Specialist | Admitting: Physical Therapy

## 2016-12-01 VITALS — BP 143/92 | HR 86 | Temp 98.4°F | Resp 20 | Ht 71.0 in | Wt 220.0 lb

## 2016-12-01 DIAGNOSIS — M6281 Muscle weakness (generalized): Secondary | ICD-10-CM | POA: Diagnosis not present

## 2016-12-01 DIAGNOSIS — G8929 Other chronic pain: Secondary | ICD-10-CM | POA: Insufficient documentation

## 2016-12-01 DIAGNOSIS — I739 Peripheral vascular disease, unspecified: Secondary | ICD-10-CM | POA: Diagnosis not present

## 2016-12-01 DIAGNOSIS — I70212 Atherosclerosis of native arteries of extremities with intermittent claudication, left leg: Secondary | ICD-10-CM | POA: Diagnosis not present

## 2016-12-01 DIAGNOSIS — M545 Low back pain: Secondary | ICD-10-CM | POA: Diagnosis not present

## 2016-12-01 LAB — VAS US LOWER EXTREMITY ARTERIAL DUPLEX
LATIBDISTSYS: 16 cm/s
LEFT PERO DIST SYS: 54 cm/s
LPOPDPSV: -57 cm/s
LSFDPSV: -72 cm/s
LSFMPSV: -89 cm/s
Left super femoral prox sys PSV: 154 cm/s
left post tibial dist sys: 71 cm/s

## 2016-12-01 NOTE — Therapy (Signed)
[facility] [address] [place], [place], [address] Phone: [phone]   Fax:  [phone]  Physical Therapy Treatment  Patient Details  Name: Mark Stevenson MRN: [id number] Date of Birth: 11/06/1955 Referring Provider: Johnn Hai   Encounter Date: 12/01/2016      PT End of Session - 12/01/16 1313    Visit Number 4   Number of Visits 8   Date for PT Re-Evaluation 12/09/16   PT Start Time 3570   PT Stop Time 1131   PT Time Calculation (min) 55 min   Activity Tolerance Patient tolerated treatment well   Behavior During Therapy [facility] for tasks assessed/performed      Past Medical History:  Diagnosis Date  . Arthritis   . COPD (chronic obstructive pulmonary disease) ([place])   . Dyspnea    increased exertion; pt states can walk a flight of stairs w/o having to stop to catch breath   . Hypertension   . Nicotine dependence, cigarettes, uncomplicated   . Peripheral neuropathy ([place])   . Pure hypercholesterolemia   . Sebaceous cyst    scrotal area   . Viral wart     Past Surgical History:  Procedure Laterality Date  . ankles     tarsal tunnel surgery bilat  . BACK SURGERY    . broken knee cap    . crushed L-4 vertebrae  2008  . KNEE ARTHROSCOPY     x3 bilat  . LUMBAR LAMINECTOMY/DECOMPRESSION MICRODISCECTOMY N/A 10/22/2016   Procedure: MICRO LUMBAR DECOMPRESSION L3-L4, L4-L5 REVISION  2 LEVELS;  Surgeon: Susa Day, MD;  [facility]: [facility];  Service: Orthopedics;  Laterality: N/A;  Requests 150 mins  . PERIPHERAL VASCULAR CATHETERIZATION N/A 04/22/2016   Procedure: Abdominal Aortogram w/ bilateral Lower Extremity Runoff;  Surgeon: Serafina Mitchell, MD;  Location: [place] [facility];  Service: Cardiovascular;  Laterality: N/A;  . PERIPHERAL VASCULAR CATHETERIZATION Left 04/22/2016   Procedure: Peripheral Vascular Atherectomy;  Surgeon: Serafina Mitchell, MD;  Location: [place] [facility];  Service: Cardiovascular;   Laterality: Left;  . ruptured disc    . tendons in calves lengthened     plantar fascitis  . TOTAL HIP ARTHROPLASTY  2012    There were no vitals filed for this visit.      Subjective Assessment - 12/01/16 1310    Subjective Pt. states he has not been compliant with HEP and remains limited with prolonged walking due to incresae low back discomfort.  Pt. states he needs to do better with back/core ex. program and discussed returning to gym.  Pt. returns to MD on 12/17/16.     Limitations Lifting;Standing;Walking;House hold activities   Patient Stated Goals Pt. would like to return to a walking program/gym-based program with decreased pain.    Currently in Pain? No/denies      OBJECTIVE:  There.Ex.: SciFit L6 8 min. (warm-up/no charge) w/ focus on core activation and good posture. No increase in pain but c/o L quad muscle fatigue and requested to stop due to LE fatigue/ muscle soreness. Step-ups on 6" step B UE support on rails 15x R/L followed by 12" step-ups (weaker on LLE than RLE).  Standing BTB B UE ex. (scap. Retraction/ biceps curls/ sh. Ext./ horizontal flexion/ B ER)- 20x each with cuing for core muscle activation.  Reviewed supine TrA ex. Program.  (no pain).    Manual tx: supine hamstring, calf, piriformis figure 4 stretch 30 sec. Static holds. 5x each.  Pt. Is demonstrating improved hamstring/ piriformis muscle length. HEP reviewed in supine. Pt. Instructed to continue with core HEP into pain-free ranges.  STM to lumbar paraspinals in prone position.    Pt. Will benefit from continued gross LE strength with focus on core activation and maintaining proper body mechanics to progress towards pain-free mobility and ambulation.  No pain in lumbar region.          PT Long Term Goals - 11/17/16 1217      PT LONG TERM GOAL #1   Title Pt. will increase B hip flexion to 4+/5 MMT in order to promote return to a walking program.    Baseline 4-/5 B hip flexion   Time 4   Period  Weeks   Status On-going     PT LONG TERM GOAL #2   Title Pt. will complete modified Oswestry Disability Index and score <20% to indicate minimal perceived disability   Baseline 26% on 10/1916   Time 4   Period Weeks   Status Not Met     PT LONG TERM GOAL #3   Title Pt. will report no increased low back or hip pain while ambulating in a grocery store to promote return to normal daily activities.   Baseline Pt. reports he is currently unable to complete shopping tasks without severe pain   Time 4   Period Weeks   Status On-going     PT LONG TERM GOAL #4   Title Pt. will return to a gym-based walking program in order to return to previous fitness levels prior to onset of back pain.   Baseline Pt. not currently able to exercise due to pain   Time 4   Period Weeks   Status On-going               Plan - 12/01/16 1335    Clinical Impression Statement Moderate cuing t/o tx. to contract/maintain core muscle activitation during all ther.exKermit Balo B UE muscle strength but cuing to stand upright/ relax B UT muscle during core ex.  Pt. presents with increasing B LE muscle flexibility during supine LE/lumbar stretches.  No c/o back pain duirng supine strectches/ review of core ex. progression in supine position.     Rehab Potential Fair   Clinical Impairments Affecting Rehab Potential chronic hx of back pain, multiple surgeries, but motivated   PT Frequency 2x / week   PT Duration 4 weeks   PT Treatment/Interventions ADLs/Self Care Home Management;Cryotherapy;Dentist;Therapeutic activities;Functional mobility training;Therapeutic exercise;Balance training;Neuromuscular re-education;Patient/family education;Manual techniques;Passive range of motion   PT Next Visit Plan progress HEP, standing LE strengthening.  Pt. returns to MD 12/17/16.      Patient will benefit from skilled therapeutic intervention in order to improve the following  deficits and impairments:  Abnormal gait, Decreased balance, Decreased endurance, Decreased mobility, Difficulty walking, Hypomobility, Decreased range of motion, Decreased scar mobility, Improper body mechanics, Decreased activity tolerance, Decreased strength, Impaired flexibility, Postural dysfunction, Pain  Visit Diagnosis: Muscle weakness (generalized)  Chronic midline low back pain without sciatica     Problem List Patient Active Problem List   Diagnosis Date Noted  . Spinal stenosis of lumbar region 10/22/2016   Pura Spice, PT, DPT # [id number] 12/02/2016, 8:48 AM  [facility] [address] [place], [place], [address] Phone: [phone]   Fax:  [phone]  Name: Mark Stevenson MRN: [id number] Date of Birth: 19-Aug-1956

## 2016-12-01 NOTE — Progress Notes (Signed)
Patient name: Mark Stevenson MRN: [id number] DOB: Feb 10, 1956 Sex: male    HPI: Mark Stevenson is a 61 y.o. male who was originally referred for left hip claudication 04/14/2017.  He states that he has left hip claudication after ambulating approximately 75-100 yards. His pain is better with rest.  He did a trial of cilostazol but did not tolerate it.  04/22/2016 he under went an angiogram by Dr. Trula Slade.   Atherectomy, left superficial femoral artery,   Drug coated balloon angioplasty, left superficial femoral artery.    Since then he on 10/23/2016 Dr. Maxie Better performed: PROCEDURE PERFORMED: 1. Revision microlumbar decompression L3-4 bilaterally. 2. Microlumbar decompression bilaterally at L4-5. 3. Gill laminectomy of L4. He is currently in PT.  He is also talking about going back to the gym.  He continues to smoke.        Past Medical History:  Diagnosis Date  . Arthritis   . COPD (chronic obstructive pulmonary disease) ([place])   . Dyspnea    increased exertion; pt states can walk a flight of stairs w/o having to stop to catch breath   . Hypertension   . Nicotine dependence, cigarettes, uncomplicated   . Peripheral neuropathy ([place])   . Pure hypercholesterolemia   . Sebaceous cyst    scrotal area   . Viral wart    Past Surgical History:  Procedure Laterality Date  . ankles     tarsal tunnel surgery bilat  . BACK SURGERY    . broken knee cap    . crushed L-4 vertebrae  2008  . KNEE ARTHROSCOPY     x3 bilat  . LUMBAR LAMINECTOMY/DECOMPRESSION MICRODISCECTOMY N/A 10/22/2016   Procedure: MICRO LUMBAR DECOMPRESSION L3-L4, L4-L5 REVISION  2 LEVELS;  Surgeon: Susa Day, MD;  [facility];  Service: Orthopedics;  Laterality: N/A;  Requests 150 mins  . PERIPHERAL VASCULAR CATHETERIZATION N/A 04/22/2016   Procedure: Abdominal Aortogram w/ bilateral Lower Extremity Runoff;  Surgeon: Serafina Mitchell, MD;  Location: [place] [facility];  Service: Cardiovascular;  Laterality:  N/A;  . PERIPHERAL VASCULAR CATHETERIZATION Left 04/22/2016   Procedure: Peripheral Vascular Atherectomy;  Surgeon: Serafina Mitchell, MD;  Location: [place] [facility];  Service: Cardiovascular;  Laterality: Left;  . ruptured disc    . tendons in calves lengthened     plantar fascitis  . TOTAL HIP ARTHROPLASTY  2012    Family History  Problem Relation Age of Onset  . Colon cancer Son   . Liver cancer Son   . Pancreatic cancer Neg Hx   . Stomach cancer Neg Hx     SOCIAL HISTORY: Social History   Social History  . Marital status: Divorced    Spouse name: N/A  . Number of children: N/A  . Years of education: N/A   Occupational History  . Not on file.   Social History Main Topics  . Smoking status: Current Every Day Smoker    Packs/day: 1.50    Years: 42.00    Types: Cigarettes  . Smokeless tobacco: Never Used  . Alcohol use 6.0 oz/week    10 Shots of liquor per week     Comment: 3 days per week vodka   . Drug use: No  . Sexual activity: Not on file   Other Topics Concern  . Not on file   Social History Narrative  . No narrative on file    No Known Allergies  Current Outpatient Prescriptions  Medication Sig  Dispense Refill  . ADVAIR DISKUS 250-50 MCG/DOSE AEPB Inhale 1 puff into the lungs 2 (two) times daily.     . Cholecalciferol (VITAMIN D3) 2000 UNITS TABS Take 2,000 Units by mouth daily.     Marland Kitchen docusate sodium (COLACE) 100 MG capsule Take 1 capsule (100 mg total) by mouth 2 (two) times daily as needed for mild constipation. 30 capsule 1  . gabapentin (NEURONTIN) 600 MG tablet Take 600 mg by mouth at bedtime.     Marland Kitchen losartan (COZAAR) 100 MG tablet Take 100 mg by mouth daily.    Marland Kitchen triamcinolone cream (KENALOG) 0.1 % Apply 1 application topically daily as needed for rash.    . oxyCODONE-acetaminophen (PERCOCET) 5-325 MG tablet Take 1-2 tablets by mouth every 4 (four) hours as needed for severe pain. (Patient not taking: Reported on 12/01/2016) 40 tablet 0  .  polyethylene glycol (MIRALAX / GLYCOLAX) packet Take 17 g by mouth daily. (Patient not taking: Reported on 12/01/2016) 14 each 0  . triamterene-hydrochlorothiazide (MAXZIDE-25) 37.5-25 MG per tablet Take 1 tablet by mouth daily.     No current facility-administered medications for this visit.     ROS:   General:  No weight loss, Fever, chills  HEENT: No recent headaches, no nasal bleeding, no visual changes, no sore throat  Neurologic: No dizziness, blackouts, seizures. No recent symptoms of stroke or mini- stroke. No recent episodes of slurred speech, or temporary blindness.  Cardiac: No recent episodes of chest pain/pressure, no shortness of breath at rest.  No shortness of breath with exertion.  Denies history of atrial fibrillation or irregular heartbeat  Vascular: No history of rest pain in feet.  No history of claudication.  No history of non-healing ulcer, No history of DVT   Pulmonary: No home oxygen, no productive cough, no hemoptysis,  No asthma or wheezing  Musculoskeletal:  [X]  Arthritis, [X]  Low back pain,  [X]  Joint pain  Hematologic:No history of hypercoagulable state.  No history of easy bleeding.  No history of anemia  Gastrointestinal: No hematochezia or melena,  No gastroesophageal reflux, no trouble swallowing  Urinary: [ ]  chronic Kidney disease, [ ]  on HD - [ ]  MWF or [ ]  TTHS, [ ]  Burning with urination, [ ]  Frequent urination, [ ]  Difficulty urinating;   Skin: No rashes  Psychological: No history of anxiety,  No history of depression   Physical Examination  Vitals:   12/01/16 1417  BP: (!) 143/92  Pulse: 86  Resp: 20  Temp: 98.4 F (36.9 C)  TempSrc: Oral  SpO2: 98%  Weight: 220 lb (99.8 kg)  Height: 5\' 11"  (1.803 m)    Body mass index is 30.68 kg/m.  General:  Alert and oriented, no acute distress HEENT: Normal Neck: No bruit or JVD Pulmonary: Clear to auscultation bilaterally Cardiac: Regular Rate and Rhythm without murmur Abdomen: Soft,  non-tender, non-distended, no mass, no scars Skin: No rash Extremity Pulses:  2+ radial, brachial, femoral, B dorsalis pedis, Left posterior tibial pulses  Musculoskeletal: No deformity or edema  Neurologic: Upper and lower extremity motor 5/5 and symmetric  DATA:  ABI's Right 1.01biphasic flow right DP Left 0.99 triphasic DP, biphasic PT  ASSESSMENT:   Atherectomy, left superficial femoral artery,   Drug coated balloon angioplasty, left superficial femoral artery.   PLAN:   He feel weak and now reports he has lumbar pain more than hip pain since his lumbar discectomy surgery.  He will continue to go to PT and return to  the gym.  I encouraged him to stop smoking.  He will f/u with NP/PA in 1 year for repeat ABI's and left arterial duplex.     Theda Sers, Rayyan Orsborn MAUREEN PA-C [facility] MD: Dr. Trula Slade

## 2016-12-03 ENCOUNTER — Ambulatory Visit: Payer: 59 | Admitting: Physical Therapy

## 2016-12-03 ENCOUNTER — Encounter: Payer: Self-pay | Admitting: Physical Therapy

## 2016-12-03 DIAGNOSIS — M545 Low back pain, unspecified: Secondary | ICD-10-CM

## 2016-12-03 DIAGNOSIS — M6281 Muscle weakness (generalized): Secondary | ICD-10-CM

## 2016-12-03 DIAGNOSIS — G8929 Other chronic pain: Secondary | ICD-10-CM

## 2016-12-03 NOTE — Therapy (Addendum)
[facility] [address] [place], [place], [address] Phone: [phone]   Fax:  [phone]  Physical Therapy Treatment  Patient Details  Name: Mark Stevenson MRN: [id number] Date of Birth: Feb 18, 1956 Referring Provider: Johnn Hai   Encounter Date: 12/03/2016      PT End of Session - 12/04/16 1145    Visit Number 5   Number of Visits 8   Date for PT Re-Evaluation 12/09/16   PT Start Time 1021   PT Stop Time 1118   PT Time Calculation (min) 57 min   Activity Tolerance Patient tolerated treatment well   Behavior During Therapy [facility] for tasks assessed/performed      Past Medical History:  Diagnosis Date  . Arthritis   . COPD (chronic obstructive pulmonary disease) ([place])   . Dyspnea    increased exertion; pt states can walk a flight of stairs w/o having to stop to catch breath   . Hypertension   . Nicotine dependence, cigarettes, uncomplicated   . Peripheral neuropathy ([place])   . Pure hypercholesterolemia   . Sebaceous cyst    scrotal area   . Viral wart     Past Surgical History:  Procedure Laterality Date  . ankles     tarsal tunnel surgery bilat  . BACK SURGERY    . broken knee cap    . crushed L-4 vertebrae  2008  . KNEE ARTHROSCOPY     x3 bilat  . LUMBAR LAMINECTOMY/DECOMPRESSION MICRODISCECTOMY N/A 10/22/2016   Procedure: MICRO LUMBAR DECOMPRESSION L3-L4, L4-L5 REVISION  2 LEVELS;  Surgeon: Susa Day, MD;  [facility]: [facility];  Service: Orthopedics;  Laterality: N/A;  Requests 150 mins  . PERIPHERAL VASCULAR CATHETERIZATION N/A 04/22/2016   Procedure: Abdominal Aortogram w/ bilateral Lower Extremity Runoff;  Surgeon: Serafina Mitchell, MD;  Location: [place] [facility];  Service: Cardiovascular;  Laterality: N/A;  . PERIPHERAL VASCULAR CATHETERIZATION Left 04/22/2016   Procedure: Peripheral Vascular Atherectomy;  Surgeon: Serafina Mitchell, MD;  Location: [place] [facility];  Service: Cardiovascular;   Laterality: Left;  . ruptured disc    . tendons in calves lengthened     plantar fascitis  . TOTAL HIP ARTHROPLASTY  2012    There were no vitals filed for this visit.      Subjective Assessment - 12/04/16 1145    Limitations Lifting;Standing;Walking;House hold activities   Patient Stated Goals Pt. would like to return to a walking program/gym-based program with decreased pain.    Currently in Pain? No/denies     Pt. reports stiffness in low back and remains limited with prolonged walking due to increasing back discomfort.  Pt. reports limited compliance with HEP and planning on checking out [employer] after PT tx. session today.      OBJECTIVE:  There.Ex.: SciFit L6 10 min. (warm-up/no charge) w/ focus on core activation and good posture.  Step-ups on 6" stepB UE support on rails 15x R/L followed by 12" step-ups (weaker on LLE than RLE).  Standing BTB B UE ex. (scap. Retraction/ biceps curls/ sh. Ext./ horizontal flexion/ B ER)- 20x each with cuing for core muscle activation. Seated blue theraball ex.: wt. Shifting/ marching/ shoulder flexion/ LAQ/ alt. UE and LE 20x each.  20# floor to waist box lifting/ B carrying.      Manual tx: supine hamstring, calf, piriformisfigure 4stretch 30 sec. Static holds. 5x each. Pt. Is demonstrating improved hamstring/ piriformis muscle length. HEP reviewed in supine. Pt. Instructed  to continue with core HEP into pain-free ranges.  STM to lumbar paraspinals in prone position.     Pt. Will benefit from continued gross LE strength with focus on core activation and maintaining proper body mechanics to progress towards pain-free mobility and ambulation.  No pain in lumbar region.        Pt. continues to require occasional verbal/tactile cuing with core muscle activation and during dynamic lifting tasks.  Pt. reports improvement in back stiffness after supine stretches but L hip discomfort noted.  Good posture/ balance during progression of  seated blue theraball ex.         PT Long Term Goals - 11/17/16 1217      PT LONG TERM GOAL #1   Title Pt. will increase B hip flexion to 4+/5 MMT in order to promote return to a walking program.    Baseline 4-/5 B hip flexion   Time 4   Period Weeks   Status On-going     PT LONG TERM GOAL #2   Title Pt. will complete modified Oswestry Disability Index and score <20% to indicate minimal perceived disability   Baseline 26% on 10/1916   Time 4   Period Weeks   Status Not Met     PT LONG TERM GOAL #3   Title Pt. will report no increased low back or hip pain while ambulating in a grocery store to promote return to normal daily activities.   Baseline Pt. reports he is currently unable to complete shopping tasks without severe pain   Time 4   Period Weeks   Status On-going     PT LONG TERM GOAL #4   Title Pt. will return to a gym-based walking program in order to return to previous fitness levels prior to onset of back pain.   Baseline Pt. not currently able to exercise due to pain   Time 4   Period Weeks   Status On-going               Plan - 12/04/16 1146    Rehab Potential Fair   Clinical Impairments Affecting Rehab Potential chronic hx of back pain, multiple surgeries, but motivated   PT Frequency 2x / week   PT Duration 4 weeks   PT Treatment/Interventions ADLs/Self Care Home Management;Cryotherapy;Dentist;Therapeutic activities;Functional mobility training;Therapeutic exercise;Balance training;Neuromuscular re-education;Patient/family education;Manual techniques;Passive range of motion   PT Next Visit Plan progress HEP, standing LE strengthening.  Pt. returns to MD 12/17/16.   PT Home Exercise Plan see handout   Consulted and Agree with Plan of Care Patient      Patient will benefit from skilled therapeutic intervention in order to improve the following deficits and impairments:  Abnormal gait, Decreased  balance, Decreased endurance, Decreased mobility, Difficulty walking, Hypomobility, Decreased range of motion, Decreased scar mobility, Improper body mechanics, Decreased activity tolerance, Decreased strength, Impaired flexibility, Postural dysfunction, Pain  Visit Diagnosis: Muscle weakness (generalized)  Chronic midline low back pain without sciatica     Problem List Patient Active Problem List   Diagnosis Date Noted  . Spinal stenosis of lumbar region 10/22/2016   Pura Spice, PT, DPT # [phone] 12/04/2016, 2:47 PM  [facility] [address] [place], [place], [address] Phone: [phone]   Fax:  [phone]  Name: LARRELL RAPOZO MRN: [id number] Date of Birth: [birth year]

## 2016-12-08 ENCOUNTER — Ambulatory Visit: Payer: 59 | Admitting: Physical Therapy

## 2016-12-08 DIAGNOSIS — M6281 Muscle weakness (generalized): Secondary | ICD-10-CM | POA: Diagnosis not present

## 2016-12-08 DIAGNOSIS — M545 Low back pain: Secondary | ICD-10-CM

## 2016-12-08 DIAGNOSIS — G8929 Other chronic pain: Secondary | ICD-10-CM

## 2016-12-08 NOTE — Therapy (Addendum)
[facility] [address]. [place], [place], [address] Phone: [phone]   Fax:  [phone]  Physical Therapy Treatment  Patient Details  Name: Mark Stevenson MRN: [id number] Date of Birth: [birth year] Referring Provider: Johnn Hai   Encounter Date: 12/08/2016      PT End of Session - 12/09/16 1544    Visit Number 6   Number of Visits 8   Date for PT Re-Evaluation 12/09/16   PT Start Time 1120   PT Stop Time 1204   PT Time Calculation (min) 44 min   Activity Tolerance Patient tolerated treatment well   Behavior During Therapy [facility] for tasks assessed/performed      Past Medical History:  Diagnosis Date  . Arthritis   . COPD (chronic obstructive pulmonary disease) ([place])   . Dyspnea    increased exertion; pt states can walk a flight of stairs w/o having to stop to catch breath   . Hypertension   . Nicotine dependence, cigarettes, uncomplicated   . Peripheral neuropathy ([place])   . Pure hypercholesterolemia   . Sebaceous cyst    scrotal area   . Viral wart     Past Surgical History:  Procedure Laterality Date  . ankles     tarsal tunnel surgery bilat  . BACK SURGERY    . broken knee cap    . crushed L-4 vertebrae  2008  . KNEE ARTHROSCOPY     x3 bilat  . LUMBAR LAMINECTOMY/DECOMPRESSION MICRODISCECTOMY N/A 10/22/2016   Procedure: MICRO LUMBAR DECOMPRESSION L3-L4, L4-L5 REVISION  2 LEVELS;  Surgeon: Susa Day, MD;  Location: [facility];  Service: Orthopedics;  Laterality: N/A;  Requests 150 mins  . PERIPHERAL VASCULAR CATHETERIZATION N/A 04/22/2016   Procedure: Abdominal Aortogram w/ bilateral Lower Extremity Runoff;  Surgeon: Serafina Mitchell, MD;  Location: [place] [facility];  Service: Cardiovascular;  Laterality: N/A;  . PERIPHERAL VASCULAR CATHETERIZATION Left 04/22/2016   Procedure: Peripheral Vascular Atherectomy;  Surgeon: Serafina Mitchell, MD;  Location: [place] [facility];  Service: Cardiovascular;   Laterality: Left;  . ruptured disc    . tendons in calves lengthened     plantar fascitis  . TOTAL HIP ARTHROPLASTY  2012    There were no vitals filed for this visit.      Subjective Assessment - 12/09/16 1544    Subjective Pt. reports no new complaints and states he stopped by [employer] after last PT appt.   Pt. is planning on going to gym/ pool for a couple months after completion of PT tx. sessions.     Limitations Lifting;Standing;Walking;House hold activities   Patient Stated Goals Pt. would like to return to a walking program/gym-based program with decreased pain.    Currently in Pain? No/denies      Pt. reports no new complaints and states he stopped by [employer] after last PT appt.   Pt. is planning on going to gym/ pool for a couple months after completion of PT tx. sessions.    Therex:  Supine dead bug with ball (B LE/ alternating) 20x each (min. Cuing).  Core stability ex. With yellow ball (single/B UE assist).  Supine hip abd. With bridging 20x.  Sit to stand with no UE assist (minimal dizziness reported).  Step ups on BOSU and side stepping with cuing for posture (L hip/LE muscle weakness).  Scifit L6 10 min. B UE/LE (no rest breaks/ no muscle soreness reported).  Manual tx.: Supine hamstring, calf,  piriformisfigure 4stretch 30 sec. Static holds. 5x each (good L hip mobility).  Pt. Instructed to continue with core HEP into pain-free ranges. STM to lumbar paraspinals in prone position.    Pt. Will benefit from continued gross LE strength with focus on core activation and maintaining proper body mechanics to progress towards pain-free mobility and ambulation. No pain in lumbar region.  Progressing to a gym based ex. Program.        Pt. progressing well with core stability ex. program to promote improvement with pain-free functional mobility.  Pt. remains limited with prolonged walking tasks due to increasing back discomfort.  Good B LE muscle  flexibility with slight L hip discomfort with ITB/piriformis stretching.  Pt. will benefit from discharge from PT after next tx. with focus on a progressive gym based ex. program.          PT Long Term Goals - 11/17/16 1217      PT LONG TERM GOAL #1   Title Pt. will increase B hip flexion to 4+/5 MMT in order to promote return to a walking program.    Baseline 4-/5 B hip flexion   Time 4   Period Weeks   Status On-going     PT LONG TERM GOAL #2   Title Pt. will complete modified Oswestry Disability Index and score <20% to indicate minimal perceived disability   Baseline 26% on 10/1916   Time 4   Period Weeks   Status Not Met     PT LONG TERM GOAL #3   Title Pt. will report no increased low back or hip pain while ambulating in a grocery store to promote return to normal daily activities.   Baseline Pt. reports he is currently unable to complete shopping tasks without severe pain   Time 4   Period Weeks   Status On-going     PT LONG TERM GOAL #4   Title Pt. will return to a gym-based walking program in order to return to previous fitness levels prior to onset of back pain.   Baseline Pt. not currently able to exercise due to pain   Time 4   Period Weeks   Status On-going               Plan - 12/09/16 1545    Clinical Impression Statement Pt. progressing well with core stability ex. program to promote improvement with pain-free functional mobility.  Pt. remains limited with prolonged walking tasks due to increasing back discomfort.  Good B LE muscle flexibility with slight L hip discomfort with ITB/piriformis stretching.  Pt. will benefit from discharge from PT after next tx. with focus on a progressive gym based ex. program.     Rehab Potential Fair   Clinical Impairments Affecting Rehab Potential chronic hx of back pain, multiple surgeries, but motivated   PT Frequency 2x / week   PT Duration 4 weeks   PT Treatment/Interventions ADLs/Self Care Home  Management;Cryotherapy;Dentist;Therapeutic activities;Functional mobility training;Therapeutic exercise;Balance training;Neuromuscular re-education;Patient/family education;Manual techniques;Passive range of motion   PT Next Visit Plan progress HEP, standing LE strengthening.  Pt. returns to MD 12/17/16.   PT Home Exercise Plan see handout   Consulted and Agree with Plan of Care Patient      Patient will benefit from skilled therapeutic intervention in order to improve the following deficits and impairments:  Abnormal gait, Decreased balance, Decreased endurance, Decreased mobility, Difficulty walking, Hypomobility, Decreased range of motion, Decreased scar mobility, Improper body mechanics, Decreased  activity tolerance, Decreased strength, Impaired flexibility, Postural dysfunction, Pain  Visit Diagnosis: Muscle weakness (generalized)  Chronic midline low back pain without sciatica     Problem List Patient Active Problem List   Diagnosis Date Noted  . Spinal stenosis of lumbar region 10/22/2016   Pura Spice, PT, DPT # [phone] 12/10/2016, 9:28 AM  [facility] [address] [place], [place], [address] Phone: [phone]   Fax:  [phone]  Name: Mark Stevenson MRN: [id number] Date of Birth: 1956/05/09

## 2016-12-08 NOTE — Addendum Note (Signed)
Addended by: Lianne Cure A on: 12/08/2016 09:16 AM   Modules accepted: Orders

## 2016-12-10 ENCOUNTER — Ambulatory Visit: Payer: 59 | Admitting: Physical Therapy

## 2016-12-10 DIAGNOSIS — M545 Low back pain, unspecified: Secondary | ICD-10-CM

## 2016-12-10 DIAGNOSIS — M6281 Muscle weakness (generalized): Secondary | ICD-10-CM

## 2016-12-10 DIAGNOSIS — G8929 Other chronic pain: Secondary | ICD-10-CM

## 2016-12-10 NOTE — Patient Instructions (Signed)
   Also given: aquatic based progressions via [employer] website

## 2016-12-10 NOTE — Therapy (Signed)
[facility] [address]. [place], [place], [address] Phone: [phone]   Fax:  [phone]  Physical Therapy Treatment  Patient Details  Name: Mark Stevenson MRN: [id number] Date of Birth: December 19, 1955 Referring Provider: Johnn Hai   Encounter Date: 12/10/2016      PT End of Session - 12/10/16 1751    Visit Number 7   Number of Visits 8   Date for PT Re-Evaluation 12/11/16   PT Start Time 0942   PT Stop Time 1031   PT Time Calculation (min) 49 min   Activity Tolerance Patient tolerated treatment well   Behavior During Therapy [facility] for tasks assessed/performed      Past Medical History:  Diagnosis Date  . Arthritis   . COPD (chronic obstructive pulmonary disease) ([place])   . Dyspnea    increased exertion; pt states can walk a flight of stairs w/o having to stop to catch breath   . Hypertension   . Nicotine dependence, cigarettes, uncomplicated   . Peripheral neuropathy ([place])   . Pure hypercholesterolemia   . Sebaceous cyst    scrotal area   . Viral wart     Past Surgical History:  Procedure Laterality Date  . ankles     tarsal tunnel surgery bilat  . BACK SURGERY    . broken knee cap    . crushed L-4 vertebrae  2008  . KNEE ARTHROSCOPY     x3 bilat  . LUMBAR LAMINECTOMY/DECOMPRESSION MICRODISCECTOMY N/A 10/22/2016   Procedure: MICRO LUMBAR DECOMPRESSION L3-L4, L4-L5 REVISION  2 LEVELS;  Surgeon: Susa Day, MD;  [facility]: [facility];  Service: Orthopedics;  Laterality: N/A;  Requests 150 mins  . PERIPHERAL VASCULAR CATHETERIZATION N/A 04/22/2016   Procedure: Abdominal Aortogram w/ bilateral Lower Extremity Runoff;  Surgeon: Serafina Mitchell, MD;  Location: [place] [facility];  Service: Cardiovascular;  Laterality: N/A;  . PERIPHERAL VASCULAR CATHETERIZATION Left 04/22/2016   Procedure: Peripheral Vascular Atherectomy;  Surgeon: Serafina Mitchell, MD;  Location: [place] [facility];  Service: Cardiovascular;   Laterality: Left;  . ruptured disc    . tendons in calves lengthened     plantar fascitis  . TOTAL HIP ARTHROPLASTY  2012    There were no vitals filed for this visit.      Subjective Assessment - 12/10/16 0952    Subjective Patient is having some pain today because he has not taken his pain medication for the first time in a while. He has signed up for a 7 day trial at [employer].    Limitations Lifting;Standing;Walking;House hold activities   Patient Stated Goals Pt. would like to return to a walking program/gym-based program with decreased pain.    Currently in Pain? Yes   Pain Score 2    Pain Location Back   Pain Orientation Mid   Pain Descriptors / Indicators Aching     Manual Supine hamstring, calf, abductors, adductors, IT band,  piriformisfigure 4stretch 30 sec. Static holds. 5x each (good L hip mobility).  Pt. Instructed to continue with core HEP into pain-free ranges. STM to lumbar paraspinals in prone position.  CPAs UPAs thoracolumbar spine 3x30 seconds each level STM to piriformis, thoracic region/parapsinals.   TherEx SciFit 10 minutes BUE/LE (no rest breaks) Sit to stands Transfers from supine/prone to sit  Education on new HEP and proper technique for lifting, using aerobic equipment, and aquatics.  MODI= 22%   Pt. progressing well with core stability ex. program  to promote improvement with pain-free functional mobility.  Pt. remains limited with prolonged walking tasks due to increasing back discomfort. Progressing to a more independent gym based program.         PT Education - 12/10/16 1751    Education provided Yes   Education Details aquatic based progressions via [employer] website, new HEP, using machines at [employer]) Educated Patient   Methods Explanation;Demonstration;Handout   Comprehension Verbalized understanding;Returned demonstration             PT Long Term Goals - 12/10/16 1757      PT LONG TERM GOAL #1   Title  Pt. will increase B hip flexion to 4+/5 MMT in order to promote return to a walking program.    Baseline 4+/5   Time 4   Period Weeks   Status Achieved     PT LONG TERM GOAL #2   Title Pt. will complete modified Oswestry Disability Index and score <20% to indicate minimal perceived disability   Baseline 3/14: 22%    Time 4   Period Weeks   Status Partially Met     PT LONG TERM GOAL #3   Title Pt. will report no increased low back or hip pain while ambulating in a grocery store to promote return to normal daily activities.   Baseline Patient is not limited by pain when shopping in grocery store. Has noted pain though.    Time 4   Period Weeks   Status Partially Met     PT LONG TERM GOAL #4   Title Pt. will return to a gym-based walking program in order to return to previous fitness levels prior to onset of back pain.   Baseline Patient has started at [facility].    Time 4   Period Weeks   Status Achieved            Plan - 12/10/16 1756    Clinical Impression Statement Patient is now member at [employer] and will begin aquatic classes there after today. Functional mobility has improved with decreased pain noted upon ambulation and aerobic activity. Prolonged durations of aerobic activity however continue to cause back discomfort. Pt. demonstrates improved core stability progression as well as LE muscular flexibility. No discomfort was noted with CPA's and UPAs of spine or soft tissue massage. Tight paraspinals in thoracic region with additional surrounding tight soft tissue musculature was decreased with STM. Patient has demonstrated improved independence and will shift to a primarily home based independent program with new HEP, aquatics, and gym.    Rehab Potential Fair   Clinical Impairments Affecting Rehab Potential chronic hx of back pain, multiple surgeries, but motivated   PT Frequency 2x / week   PT Duration 4 weeks   PT Treatment/Interventions ADLs/Self Care Home  Management;Cryotherapy;Dentist;Therapeutic activities;Functional mobility training;Therapeutic exercise;Balance training;Neuromuscular re-education;Patient/family education;Manual techniques;Passive range of motion   PT Next Visit Plan Pt. will contact PT with any questions or concerns over next several weeks.  Pt. will continue with a more independent based gym based/ ex. program.     PT Home Exercise Plan see handout   Consulted and Agree with Plan of Care Patient      Patient will benefit from skilled therapeutic intervention in order to improve the following deficits and impairments:  Abnormal gait, Decreased balance, Decreased endurance, Decreased mobility, Difficulty walking, Hypomobility, Decreased range of motion, Decreased scar mobility, Improper body mechanics, Decreased activity tolerance, Decreased strength, Impaired flexibility, Postural  dysfunction, Pain  Visit Diagnosis: Muscle weakness (generalized)  Chronic midline low back pain without sciatica     Problem List Patient Active Problem List   Diagnosis Date Noted  . Spinal stenosis of lumbar region 10/22/2016   Pura Spice, PT, DPT # [place], SPT 12/11/2016, 7:53 AM  [facility] [address] [place], [place], [address] Phone: [phone]   Fax:  [phone]  Name: Mark Stevenson MRN: [id number] Date of Birth: 25-Jul-1956

## 2016-12-19 ENCOUNTER — Encounter: Payer: Self-pay | Admitting: Gastroenterology

## 2016-12-19 NOTE — Op Note (Signed)
NAME:  VALON, GLASSCOCK                  ACCOUNT NO.:  MEDICAL RECORD NO.:  [id number]  LOCATION:                                 FACILITY:  PHYSICIAN:  Susa Day, M.D.    DATE OF BIRTH:  07/18/1956  DATE OF PROCEDURE:  12/18/2016 DATE OF DISCHARGE:                              OPERATIVE REPORT   ADDENDUM:  This is a change in the operative note of Reginold Beale from October 22, 2016.  His procedure performed is instead of Gill laminectomy of L4, it is central laminectomy of L4.     Susa Day, M.D.     Geralynn Rile  D:  12/18/2016  T:  12/18/2016  Job:  [id number]

## 2016-12-24 DIAGNOSIS — M6281 Muscle weakness (generalized): Secondary | ICD-10-CM | POA: Diagnosis not present

## 2016-12-24 DIAGNOSIS — M545 Low back pain: Secondary | ICD-10-CM | POA: Diagnosis not present

## 2016-12-31 DIAGNOSIS — M545 Low back pain: Secondary | ICD-10-CM | POA: Diagnosis not present

## 2016-12-31 DIAGNOSIS — M6281 Muscle weakness (generalized): Secondary | ICD-10-CM | POA: Diagnosis not present

## 2017-01-02 DIAGNOSIS — M6281 Muscle weakness (generalized): Secondary | ICD-10-CM | POA: Diagnosis not present

## 2017-01-02 DIAGNOSIS — M545 Low back pain: Secondary | ICD-10-CM | POA: Diagnosis not present

## 2017-01-07 DIAGNOSIS — M545 Low back pain: Secondary | ICD-10-CM | POA: Diagnosis not present

## 2017-01-07 DIAGNOSIS — M6281 Muscle weakness (generalized): Secondary | ICD-10-CM | POA: Diagnosis not present

## 2017-01-09 DIAGNOSIS — M6281 Muscle weakness (generalized): Secondary | ICD-10-CM | POA: Diagnosis not present

## 2017-01-09 DIAGNOSIS — M545 Low back pain: Secondary | ICD-10-CM | POA: Diagnosis not present

## 2017-01-13 DIAGNOSIS — M6281 Muscle weakness (generalized): Secondary | ICD-10-CM | POA: Diagnosis not present

## 2017-01-13 DIAGNOSIS — M545 Low back pain: Secondary | ICD-10-CM | POA: Diagnosis not present

## 2017-01-16 DIAGNOSIS — M6281 Muscle weakness (generalized): Secondary | ICD-10-CM | POA: Diagnosis not present

## 2017-01-16 DIAGNOSIS — M545 Low back pain: Secondary | ICD-10-CM | POA: Diagnosis not present

## 2017-01-20 DIAGNOSIS — M545 Low back pain: Secondary | ICD-10-CM | POA: Diagnosis not present

## 2017-01-20 DIAGNOSIS — M6281 Muscle weakness (generalized): Secondary | ICD-10-CM | POA: Diagnosis not present

## 2017-01-21 DIAGNOSIS — M6281 Muscle weakness (generalized): Secondary | ICD-10-CM | POA: Diagnosis not present

## 2017-01-21 DIAGNOSIS — M545 Low back pain: Secondary | ICD-10-CM | POA: Diagnosis not present

## 2017-01-28 DIAGNOSIS — M6281 Muscle weakness (generalized): Secondary | ICD-10-CM | POA: Diagnosis not present

## 2017-01-28 DIAGNOSIS — M545 Low back pain: Secondary | ICD-10-CM | POA: Diagnosis not present

## 2017-01-30 DIAGNOSIS — M6281 Muscle weakness (generalized): Secondary | ICD-10-CM | POA: Diagnosis not present

## 2017-01-30 DIAGNOSIS — M545 Low back pain: Secondary | ICD-10-CM | POA: Diagnosis not present

## 2017-02-02 DIAGNOSIS — M6281 Muscle weakness (generalized): Secondary | ICD-10-CM | POA: Diagnosis not present

## 2017-02-02 DIAGNOSIS — M545 Low back pain: Secondary | ICD-10-CM | POA: Diagnosis not present

## 2017-02-04 ENCOUNTER — Ambulatory Visit (AMBULATORY_SURGERY_CENTER): Payer: Self-pay

## 2017-02-04 VITALS — Ht 71.0 in | Wt 218.0 lb

## 2017-02-04 DIAGNOSIS — Z800 Family history of malignant neoplasm of digestive organs: Secondary | ICD-10-CM

## 2017-02-04 MED ORDER — NA SULFATE-K SULFATE-MG SULF 17.5-3.13-1.6 GM/177ML PO SOLN: ORAL | 0 refills | Status: DC

## 2017-02-04 NOTE — Progress Notes (Signed)
Per pt, no allergies to soy or egg products.Pt not taking any weight loss meds or using  O2 at home.   Pt refused Emmi video. 

## 2017-02-05 ENCOUNTER — Encounter: Payer: Self-pay | Admitting: Gastroenterology

## 2017-02-06 DIAGNOSIS — M545 Low back pain: Secondary | ICD-10-CM | POA: Diagnosis not present

## 2017-02-06 DIAGNOSIS — M6281 Muscle weakness (generalized): Secondary | ICD-10-CM | POA: Diagnosis not present

## 2017-02-10 DIAGNOSIS — M6281 Muscle weakness (generalized): Secondary | ICD-10-CM | POA: Diagnosis not present

## 2017-02-10 DIAGNOSIS — M545 Low back pain: Secondary | ICD-10-CM | POA: Diagnosis not present

## 2017-02-11 ENCOUNTER — Ambulatory Visit
Admission: RE | Admit: 2017-02-11 | Discharge: 2017-02-11 | Disposition: A | Discharge status: Home / Self Care | Payer: 59 | Source: Ambulatory Visit | Attending: Family Medicine | Admitting: Family Medicine

## 2017-02-11 ENCOUNTER — Other Ambulatory Visit: Payer: Self-pay | Admitting: Family Medicine

## 2017-02-11 DIAGNOSIS — Z125 Encounter for screening for malignant neoplasm of prostate: Secondary | ICD-10-CM | POA: Diagnosis not present

## 2017-02-11 DIAGNOSIS — M25511 Pain in right shoulder: Secondary | ICD-10-CM

## 2017-02-11 DIAGNOSIS — E7800 Pure hypercholesterolemia, unspecified: Secondary | ICD-10-CM | POA: Diagnosis not present

## 2017-02-11 DIAGNOSIS — S42031A Displaced fracture of lateral end of right clavicle, initial encounter for closed fracture: Secondary | ICD-10-CM | POA: Diagnosis not present

## 2017-02-11 DIAGNOSIS — J449 Chronic obstructive pulmonary disease, unspecified: Secondary | ICD-10-CM | POA: Diagnosis not present

## 2017-02-11 DIAGNOSIS — I10 Essential (primary) hypertension: Secondary | ICD-10-CM | POA: Diagnosis not present

## 2017-02-16 DIAGNOSIS — M7541 Impingement syndrome of right shoulder: Secondary | ICD-10-CM | POA: Diagnosis not present

## 2017-02-16 DIAGNOSIS — M48061 Spinal stenosis, lumbar region without neurogenic claudication: Secondary | ICD-10-CM | POA: Diagnosis not present

## 2017-02-18 ENCOUNTER — Ambulatory Visit (AMBULATORY_SURGERY_CENTER): Payer: 59 | Admitting: Gastroenterology

## 2017-02-18 ENCOUNTER — Encounter: Payer: Self-pay | Admitting: Gastroenterology

## 2017-02-18 VITALS — BP 138/84 | HR 73 | Temp 96.9°F | Resp 21 | Ht 71.0 in | Wt 218.0 lb

## 2017-02-18 DIAGNOSIS — D128 Benign neoplasm of rectum: Secondary | ICD-10-CM

## 2017-02-18 DIAGNOSIS — Z86010 Personal history of colonic polyps: Secondary | ICD-10-CM | POA: Diagnosis not present

## 2017-02-18 DIAGNOSIS — K635 Polyp of colon: Secondary | ICD-10-CM

## 2017-02-18 DIAGNOSIS — Z1211 Encounter for screening for malignant neoplasm of colon: Secondary | ICD-10-CM | POA: Diagnosis not present

## 2017-02-18 DIAGNOSIS — D123 Benign neoplasm of transverse colon: Secondary | ICD-10-CM | POA: Diagnosis not present

## 2017-02-18 DIAGNOSIS — Z800 Family history of malignant neoplasm of digestive organs: Secondary | ICD-10-CM

## 2017-02-18 DIAGNOSIS — K621 Rectal polyp: Secondary | ICD-10-CM

## 2017-02-18 MED ORDER — SODIUM CHLORIDE 0.9 % IV SOLN: 500.0000 mL | INTRAVENOUS | Status: DC

## 2017-02-18 NOTE — Progress Notes (Signed)
A and O x3. Report to RN. Tolerated MAC anesthesia well.

## 2017-02-18 NOTE — Patient Instructions (Signed)
YOU HAD AN ENDOSCOPIC PROCEDURE TODAY AT [facility]:   Refer to the procedure report that was given to you for any specific questions about what was found during the examination.  If the procedure report does not answer your questions, please call your gastroenterologist to clarify.  If you requested that your care partner not be given the details of your procedure findings, then the procedure report has been included in a sealed envelope for you to review at your convenience later.  YOU SHOULD EXPECT: Some feelings of bloating in the abdomen. Passage of more gas than usual.  Walking can help get rid of the air that was put into your GI tract during the procedure and reduce the bloating. If you had a lower endoscopy (such as a colonoscopy or flexible sigmoidoscopy) you may notice spotting of blood in your stool or on the toilet paper. If you underwent a bowel prep for your procedure, you may not have a normal bowel movement for a few days.  Please Note:  You might notice some irritation and congestion in your nose or some drainage.  This is from the oxygen used during your procedure.  There is no need for concern and it should clear up in a day or so.  SYMPTOMS TO REPORT IMMEDIATELY:   Following lower endoscopy (colonoscopy or flexible sigmoidoscopy):  Excessive amounts of blood in the stool  Significant tenderness or worsening of abdominal pains  Swelling of the abdomen that is new, acute  Fever of 100F or higher   For urgent or emergent issues, a gastroenterologist can be reached at any hour by calling [phone].   DIET:  We do recommend a small meal at first, but then you may proceed to your regular diet.  Drink plenty of fluids but you should avoid alcoholic beverages for 24 hours.  ACTIVITY:  You should plan to take it easy for the rest of today and you should NOT DRIVE or use heavy machinery until tomorrow (because of the sedation medicines used during the test).     FOLLOW UP: Our staff will call the number listed on your records the next business day following your procedure to check on you and address any questions or concerns that you may have regarding the information given to you following your procedure. If we do not reach you, we will leave a message.  However, if you are feeling well and you are not experiencing any problems, there is no need to return our call.  We will assume that you have returned to your regular daily activities without incident.  If any biopsies were taken you will be contacted by phone or by letter within the next 1-3 weeks.  Please call us at [phone] if you have not heard about the biopsies in 3 weeks.   Polyps (handout given) Await for biopsy results to determined next repeat Colonoscopy screening Hemorrhoids (handout given) Diverticulosis (handout given)  SIGNATURES/CONFIDENTIALITY: You and/or your care partner have signed paperwork which will be entered into your electronic medical record.  These signatures attest to the fact that that the information above on your After Visit Summary has been reviewed and is understood.  Full responsibility of the confidentiality of this discharge information lies with you and/or your care-partner.

## 2017-02-18 NOTE — Progress Notes (Signed)
Pt's states no medical or surgical changes since previsit or office visit. 

## 2017-02-18 NOTE — Progress Notes (Signed)
Called to room to assist during endoscopic procedure.  Patient ID and intended procedure confirmed with present staff. Received instructions for my participation in the procedure from the performing physician.  

## 2017-02-18 NOTE — Op Note (Signed)
[place] [facility] Patient Name: Mark Stevenson Procedure Date: 02/18/2017 10:40 AM MRN: [id number] Endoscopist: Ladene Artist , MD Age: 61 Referring MD:  Date of Birth: 1956/02/22 Gender: Male Account #: [id number] Procedure:                Colonoscopy Indications:              Surveillance: Personal history of adenomatous                            polyps on last colonoscopy 3 years ago. Family                            history of colon cancer. Medicines:                Monitored Anesthesia Care Procedure:                Pre-Anesthesia Assessment:                           - Prior to the procedure, a History and Physical                            was performed, and patient medications and                            allergies were reviewed. The patient's tolerance of                            previous anesthesia was also reviewed. The risks                            and benefits of the procedure and the sedation                            options and risks were discussed with the patient.                            All questions were answered, and informed consent                            was obtained. Prior Anticoagulants: The patient has                            taken no previous anticoagulant or antiplatelet                            agents. ASA Grade Assessment: II - A patient with                            mild systemic disease. After reviewing the risks                            and benefits, the patient was deemed in  satisfactory condition to undergo the procedure.                           After obtaining informed consent, the colonoscope                            was passed under direct vision. Throughout the                            procedure, the patient's blood pressure, pulse, and                            oxygen saturations were monitored continuously. The                            Colonoscope was introduced through the  anus and                            advanced to the the cecum, identified by                            appendiceal orifice and ileocecal valve. The                            ileocecal valve, appendiceal orifice, and rectum                            were photographed. The quality of the bowel                            preparation was adequate. The colonoscopy was                            performed without difficulty. The patient tolerated                            the procedure well. Scope In: 10:53:20 AM Scope Out: 06:30:16 AM Scope Withdrawal Time: 0 hours 13 minutes 12 seconds  Total Procedure Duration: 0 hours 14 minutes 58 seconds  Findings:                 The perianal and digital rectal examinations were                            normal.                           A 7 mm polyp was found in the transverse colon                            adjacent to the tattoos located the proximal edge                            of the proximal tattoo. The polyp was sessile. The  polyp was removed with a cold snare. Resection and                            retrieval were complete.                           A 5 mm polyp was found in the rectum. The polyp was                            sessile. The polyp was removed with a cold biopsy                            forceps. Resection and retrieval were complete.                           Internal hemorrhoids were found during                            retroflexion. The hemorrhoids were small and Grade                            I (internal hemorrhoids that do not prolapse).                           Two tattoos were seen in the transverse colon.                           A few medium-mouthed diverticula were found in the                            sigmoid colon, transverse colon and hepatic                            flexure. There was no evidence of diverticular                            bleeding.                            The exam was otherwise without abnormality on                            direct and retroflexion views. Complications:            No immediate complications. Estimated blood loss:                            None. Estimated Blood Loss:     Estimated blood loss: none. Impression:               - One 7 mm polyp in the transverse colon, removed                            with a cold snare. Resected and retrieved.                           -  One 5 mm polyp in the rectum, removed with a cold                            biopsy forceps. Resected and retrieved.                           - Internal hemorrhoids.                           - Two tattoos were seen in the transverse colon.                           - Mild diverticulosis in the sigmoid colon, in the                            transverse colon and at the hepatic flexure.                           - The examination was otherwise normal on direct                            and retroflexion views. Recommendation:           - Repeat colonoscopy in 3 years for surveillance.                           - Patient has a contact number available for                            emergencies. The signs and symptoms of potential                            delayed complications were discussed with the                            patient. Return to normal activities tomorrow.                            Written discharge instructions were provided to the                            patient.                           - Resume previous diet.                           - Continue present medications.                           - Await pathology results. Ladene Artist, MD 02/18/2017 11:19:36 AM This report has been signed electronically.

## 2017-02-19 ENCOUNTER — Telehealth: Payer: Self-pay | Admitting: *Deleted

## 2017-02-19 NOTE — Telephone Encounter (Signed)
  Follow up Call-  Call back number 02/18/2017  Post procedure Call Back phone  # [phone]  Permission to leave phone message Yes  Some recent data might be hidden     Patient questions:  Do you have a fever, pain , or abdominal swelling? No. Pain Score  0 *  Have you tolerated food without any problems? Yes.    Have you been able to return to your normal activities? Yes.    Do you have any questions about your discharge instructions: Diet   No. Medications  No. Follow up visit  No.  Do you have questions or concerns about your Care? No.  Actions: * If pain score is 4 or above: No action needed, pain <4.

## 2017-02-19 NOTE — Telephone Encounter (Signed)
Message left

## 2017-02-26 ENCOUNTER — Encounter: Payer: Self-pay | Admitting: Gastroenterology

## 2017-03-03 DIAGNOSIS — M7541 Impingement syndrome of right shoulder: Secondary | ICD-10-CM | POA: Diagnosis not present

## 2017-03-09 DIAGNOSIS — M7541 Impingement syndrome of right shoulder: Secondary | ICD-10-CM | POA: Diagnosis not present

## 2017-03-12 NOTE — Progress Notes (Signed)
Please place orders in EPIC as patient is being scheduled for a pre-op appointment! Thank you! 

## 2017-03-13 ENCOUNTER — Ambulatory Visit: Payer: Self-pay | Admitting: Specialist

## 2017-03-13 ENCOUNTER — Other Ambulatory Visit (HOSPITAL_COMMUNITY): Payer: Self-pay | Admitting: *Deleted

## 2017-03-13 NOTE — Patient Instructions (Addendum)
Mark Stevenson  03/13/2017   Your procedure is scheduled on: 03-19-17  Report to [facility] Main  Entrance Take [place]  elevators to 3rd floor to  [place] at 700 AM.  Call this number if you have problems the morning of surgery [phone]   Remember: ONLY 1 PERSON MAY GO WITH YOU TO SHORT STAY TO GET  READY MORNING OF YOUR SURGERY.  Do not eat food or drink liquids :After Midnight.     Take these medicines the morning of surgery with A SIP OF WATER: ADVAIR, OXYCODONE IF NEEDED, PROAIR INHALER NEEDED AND BRING INHALER, LOVASTATIN                                        Men may shave face and neck.   Do not bring valuables to the hospital. [name]  Contacts, dentures or bridgework may not be worn into surgery.  Leave suitcase in the car. After surgery it may be brought to your room.    Special Instructions: N/A              Please read over the following fact sheets you were given: _____________________________________________________________________             [facility] - Preparing for Surgery Before surgery, you can play an important role.  Because skin is not sterile, your skin needs to be as free of germs as possible.  You can reduce the number of germs on your skin by washing with CHG (chlorahexidine gluconate) soap before surgery.  CHG is an antiseptic cleaner which kills germs and bonds with the skin to continue killing germs even after washing. Please DO NOT use if you have an allergy to CHG or antibacterial soaps.  If your skin becomes reddened/irritated stop using the CHG and inform your nurse when you arrive at Short Stay. Do not shave (including legs and underarms) for at least 48 hours prior to the first CHG shower.  You may shave your face/neck. Please follow these instructions carefully:  1.  Shower with CHG Soap the night before surgery and the  morning of Surgery.  2.  If you  choose to wash your hair, wash your hair first as usual with your  normal  shampoo.  3.  After you shampoo, rinse your hair and body thoroughly to remove the  shampoo.                           4.  Use CHG as you would any other liquid soap.  You can apply chg directly  to the skin and wash                       Gently with a scrungie or clean washcloth.  5.  Apply the CHG Soap to your body ONLY FROM THE NECK DOWN.   Do not use on face/ open                           Wound or open sores. Avoid contact with eyes, ears mouth and genitals (private parts).  Wash face,  Genitals (private parts) with your normal soap.             6.  Wash thoroughly, paying special attention to the area where your surgery  will be performed.  7.  Thoroughly rinse your body with warm water from the neck down.  8.  DO NOT shower/wash with your normal soap after using and rinsing off  the CHG Soap.                9.  Pat yourself dry with a clean towel.            10.  Wear clean pajamas.            11.  Place clean sheets on your bed the night of your first shower and do not  sleep with pets. Day of Surgery : Do not apply any lotions/deodorants the morning of surgery.  Please wear clean clothes to the hospital/surgery center.  FAILURE TO FOLLOW THESE INSTRUCTIONS MAY RESULT IN THE CANCELLATION OF YOUR SURGERY PATIENT SIGNATURE_________________________________  NURSE SIGNATURE__________________________________  ________________________________________________________________________   Mark Stevenson  An incentive spirometer is a tool that can help keep your lungs clear and active. This tool measures how well you are filling your lungs with each breath. Taking long deep breaths may help reverse or decrease the chance of developing breathing (pulmonary) problems (especially infection) following:  A long period of time when you are unable to move or be active. BEFORE THE PROCEDURE   If  the spirometer includes an indicator to show your best effort, your nurse or respiratory therapist will set it to a desired goal.  If possible, sit up straight or lean slightly forward. Try not to slouch.  Hold the incentive spirometer in an upright position. INSTRUCTIONS FOR USE  1. Sit on the edge of your bed if possible, or sit up as far as you can in bed or on a chair. 2. Hold the incentive spirometer in an upright position. 3. Breathe out normally. 4. Place the mouthpiece in your mouth and seal your lips tightly around it. 5. Breathe in slowly and as deeply as possible, raising the piston or the ball toward the top of the column. 6. Hold your breath for 3-5 seconds or for as long as possible. Allow the piston or ball to fall to the bottom of the column. 7. Remove the mouthpiece from your mouth and breathe out normally. 8. Rest for a few seconds and repeat Steps 1 through 7 at least 10 times every 1-2 hours when you are awake. Take your time and take a few normal breaths between deep breaths. 9. The spirometer may include an indicator to show your best effort. Use the indicator as a goal to work toward during each repetition. 10. After each set of 10 deep breaths, practice coughing to be sure your lungs are clear. If you have an incision (the cut made at the time of surgery), support your incision when coughing by placing a pillow or rolled up towels firmly against it. Once you are able to get out of bed, walk around indoors and cough well. You may stop using the incentive spirometer when instructed by your caregiver.  RISKS AND COMPLICATIONS  Take your time so you do not get dizzy or light-headed.  If you are in pain, you may need to take or ask for pain medication before doing incentive spirometry. It is harder to take a deep breath if you are having pain.  AFTER USE  Rest and breathe slowly and easily.  It can be helpful to keep track of a log of your progress. Your caregiver can  provide you with a simple table to help with this. If you are using the spirometer at home, follow these instructions: [name] IF:   You are having difficultly using the spirometer.  You have trouble using the spirometer as often as instructed.  Your pain medication is not giving enough relief while using the spirometer.  You develop fever of 100.5 F (38.1 C) or higher. SEEK IMMEDIATE MEDICAL CARE IF:   You cough up bloody sputum that had not been present before.  You develop fever of 102 F (38.9 C) or greater.  You develop worsening pain at or near the incision site. MAKE SURE YOU:   Understand these instructions.  Will watch your condition.  Will get help right away if you are not doing well or get worse. Document Released: 01/26/2007 Document Revised: 12/08/2011 Document Reviewed: 03/29/2007 [facility] Patient Information 2014 [place], [place].   ________________________________________________________________________

## 2017-03-13 NOTE — Progress Notes (Signed)
Please place orders in EPIC as patient has a pre-op appointment on 03/16/2017! Thank you!

## 2017-03-13 NOTE — Progress Notes (Signed)
EKG 04-22-16 EPIC 12-01-16 VASCULAR [facility] PA LOV NOTE EPIC

## 2017-03-16 ENCOUNTER — Encounter (HOSPITAL_COMMUNITY): Payer: Self-pay

## 2017-03-16 ENCOUNTER — Encounter (HOSPITAL_COMMUNITY)
Admission: RE | Admit: 2017-03-16 | Discharge: 2017-03-16 | Disposition: A | Discharge status: Home / Self Care | Payer: 59 | Source: Ambulatory Visit | Attending: Specialist | Admitting: Specialist

## 2017-03-16 DIAGNOSIS — Z01812 Encounter for preprocedural laboratory examination: Secondary | ICD-10-CM | POA: Diagnosis not present

## 2017-03-16 HISTORY — DX: Gastro-esophageal reflux disease without esophagitis: K21.9

## 2017-03-16 LAB — CBC
HCT: 41.9 % (ref 39.0–52.0)
HEMOGLOBIN: 14.9 g/dL (ref 13.0–17.0)
MCH: 34.3 pg — AB (ref 26.0–34.0)
MCHC: 35.6 g/dL (ref 30.0–36.0)
MCV: 96.5 fL (ref 78.0–100.0)
PLATELETS: 202 10*3/uL (ref 150–400)
RBC: 4.34 MIL/uL (ref 4.22–5.81)
RDW: 12.7 % (ref 11.5–15.5)
WBC: 12.4 10*3/uL — ABNORMAL HIGH (ref 4.0–10.5)

## 2017-03-16 LAB — BASIC METABOLIC PANEL
ANION GAP: 9 (ref 5–15)
BUN: 15 mg/dL (ref 6–20)
CALCIUM: 9.3 mg/dL (ref 8.9–10.3)
CO2: 26 mmol/L (ref 22–32)
CREATININE: 1.06 mg/dL (ref 0.61–1.24)
Chloride: 105 mmol/L (ref 101–111)
GFR calc non Af Amer: >60 mL/min (ref >60)
Glucose, Bld: 95 mg/dL (ref 65–99)
Potassium: 3.7 mmol/L (ref 3.5–5.1)
SODIUM: 140 mmol/L (ref 135–145)

## 2017-03-18 NOTE — H&P (Signed)
Mark Stevenson is an 61 y.o. male.   Chief Complaint: right shoulder pain HPI:  Patient notes persistent  right shoulder pain.  MRI findings show  a full thickness tear of the supraspinatus and infraspinatus retracted up to 5 cm. No atrophy in the supraspinatus. Mild-to-moderate in the infraspinatus. Full thickness biceps tendon with severe retraction. Mild glenohumeral arthrosis and old fracture of the distal clavicle with probable pseudoarthrosis..  Patient has failed conservative treatment.  It is felt at this time they would benefit from surgical intervention.  Risks and benefits of surgery discussed with patient and they wish to proceed. Past Medical History:  Diagnosis Date  . Arthritis   . Cancer (HCC)    skin cancer bil knees  . COPD (chronic obstructive pulmonary disease) ([place])   . Dyspnea    increased exertion; pt states can walk a flight of stairs w/o having to stop to catch breath   . GERD (gastroesophageal reflux disease)    occ  . Hypertension   . Nicotine dependence, cigarettes, uncomplicated   . Peripheral neuropathy   . Pure hypercholesterolemia   . Sebaceous cyst    scrotal area   . Viral wart     Past Surgical History:  Procedure Laterality Date  . ankles     tarsal tunnel surgery bilat  . BACK SURGERY  2008 and 10/22/16   herniated disc  . broken knee cap  2002  . crushed L-4 vertebrae  1980  . KNEE ARTHROSCOPY     x3 bilat/ 2 times left knee  . LEG SKIN LESION  BIOPSY / EXCISION     Bil knees  . LUMBAR LAMINECTOMY/DECOMPRESSION MICRODISCECTOMY N/A 10/22/2016   Procedure: MICRO LUMBAR DECOMPRESSION L3-L4, L4-L5 REVISION  2 LEVELS;  Surgeon: Susa Day, MD;  Location: [facility];  Service: Orthopedics;  Laterality: N/A;  Requests 150 mins  . PERIPHERAL VASCULAR CATHETERIZATION N/A 04/22/2016   Procedure: Abdominal Aortogram w/ bilateral Lower Extremity Runoff;  Surgeon: Serafina Mitchell, MD;  Location: [place];  Service: Cardiovascular;  Laterality:  N/A;  . PERIPHERAL VASCULAR CATHETERIZATION Left 04/22/2016   Procedure: Peripheral Vascular Atherectomy;  Surgeon: Serafina Mitchell, MD;  Location: [place] [facility];  Service: Cardiovascular;  Laterality: Left;  . ruptured disc  2008  . tendons in calves lengthened     plantar fascitis  . TOTAL HIP ARTHROPLASTY  2012   left    Family History  Problem Relation Age of Onset  . Colon cancer Son   . Liver cancer Son   . Lung cancer Father   . Pancreatic cancer Neg Hx   . Stomach cancer Neg Hx    Social History:  reports that he has been smoking Cigarettes.  He has a 63.00 pack-year smoking history. He has never used smokeless tobacco. He reports that he drinks about 6.0 oz of alcohol per week . He reports that he does not use drugs.  Allergies: No Known Allergies  No prescriptions prior to admission.    No results found for this or any previous visit (from the past 48 hour(s)). No results found.  Review of Systems: The patient denies any fever, chills, night sweats, or bleeding tendencies. CNS: No blurred double vision, seizure, headache, paralysis. RESPIRATORY: No shortness of breath, productive cough, or hemoptysis. CARDIOVASCULAR: No chest pain, angina, or orthopnea, GU: No dysuria, hematuria, or discharge. MUSCULOSKELETAL: Pertinent per HPI.  There were no vitals taken for this visit.  GENERAL: Patient is a 61 y.o.  male, well-nourished, well-developed, no acute distress. Alert, oriented, cooperative. HENT:  Normocephalic, atraumatic. Pupils round and reactive. EOMs intact. NECK:  Supple, no bruits. CHEST:  Clear to anterior and posterior chest walls. No rhonchi, rales, wheezes. HEART:  Regular, rate and rhythm.  No murmurs.  S1 and S2 noted. ABDOMEN:  Soft, nontender, bowel sounds present. RECTAL/BREAST/GENITALIA:  Not done, not pertinent to present illness. EXTREMITIES:  On exam, positive impingement sign, positive secondary impingement of the shoulder, weak in external  rotation, nontender over the [facility]. He has a deformity of the distal clavicle, it is nontender.  Assessment/Plan right shoulder impingement with rotator cuff tear,Mild glenohumeral arthrosis. And History of pseudarthrosis of the clavicle.  After  long discussion with the patient concerning the risks and benefits of a rotator cuff repair, including bleeding, infection, prolonged postoperative recovery, which may require 3 to 5 months until maximum medical improvement. Overight procedure with initiation of early passive range of motion within physical therapy. Avoid any active motion for the first six weeks. This is all in an effort to avoid recurrent tear of the rotator cuff and adhesive capsulitis. Return to work without use of the arm can be obtained following two weeks. However, driving will be a challenge. We also discussed the possibility of requiring implants including bone anchors,as well as an Allograft patch graft if a massive rotator cuff tear is encountered. Removal of any bones for spurs as well as bursitis will be performed during the procedure and also any associated anesthetic complications as well. Patient is instructed to follow up after surgery PLAN: Mini open rotator cuff repair, possible patch graft, possible biceps tenodesis  BLAIR ROBERTS 03/18/2017, 4:59 PM

## 2017-03-19 ENCOUNTER — Encounter (HOSPITAL_COMMUNITY)
Admission: RE | Disposition: A | Discharge status: Home / Self Care | Payer: Self-pay | Source: Ambulatory Visit | Attending: Specialist

## 2017-03-19 ENCOUNTER — Ambulatory Visit (HOSPITAL_COMMUNITY): Payer: 59 | Admitting: Certified Registered Nurse Anesthetist

## 2017-03-19 ENCOUNTER — Encounter (HOSPITAL_COMMUNITY): Payer: Self-pay | Admitting: *Deleted

## 2017-03-19 ENCOUNTER — Ambulatory Visit (HOSPITAL_COMMUNITY)
Admission: RE | Admit: 2017-03-19 | Discharge: 2017-03-20 | Disposition: A | Discharge status: Home / Self Care | Payer: 59 | Source: Ambulatory Visit | Attending: Specialist | Admitting: Specialist

## 2017-03-19 DIAGNOSIS — Z96642 Presence of left artificial hip joint: Secondary | ICD-10-CM | POA: Insufficient documentation

## 2017-03-19 DIAGNOSIS — I10 Essential (primary) hypertension: Secondary | ICD-10-CM | POA: Diagnosis not present

## 2017-03-19 DIAGNOSIS — X58XXXA Exposure to other specified factors, initial encounter: Secondary | ICD-10-CM | POA: Insufficient documentation

## 2017-03-19 DIAGNOSIS — G8918 Other acute postprocedural pain: Secondary | ICD-10-CM | POA: Diagnosis not present

## 2017-03-19 DIAGNOSIS — M75120 Complete rotator cuff tear or rupture of unspecified shoulder, not specified as traumatic: Secondary | ICD-10-CM | POA: Diagnosis present

## 2017-03-19 DIAGNOSIS — J449 Chronic obstructive pulmonary disease, unspecified: Secondary | ICD-10-CM | POA: Diagnosis not present

## 2017-03-19 DIAGNOSIS — M75121 Complete rotator cuff tear or rupture of right shoulder, not specified as traumatic: Secondary | ICD-10-CM | POA: Diagnosis not present

## 2017-03-19 DIAGNOSIS — Z85828 Personal history of other malignant neoplasm of skin: Secondary | ICD-10-CM | POA: Insufficient documentation

## 2017-03-19 DIAGNOSIS — M1990 Unspecified osteoarthritis, unspecified site: Secondary | ICD-10-CM | POA: Diagnosis not present

## 2017-03-19 DIAGNOSIS — K219 Gastro-esophageal reflux disease without esophagitis: Secondary | ICD-10-CM | POA: Diagnosis not present

## 2017-03-19 DIAGNOSIS — G629 Polyneuropathy, unspecified: Secondary | ICD-10-CM | POA: Insufficient documentation

## 2017-03-19 DIAGNOSIS — M75101 Unspecified rotator cuff tear or rupture of right shoulder, not specified as traumatic: Secondary | ICD-10-CM | POA: Diagnosis not present

## 2017-03-19 DIAGNOSIS — M19011 Primary osteoarthritis, right shoulder: Secondary | ICD-10-CM | POA: Diagnosis not present

## 2017-03-19 DIAGNOSIS — E7800 Pure hypercholesterolemia, unspecified: Secondary | ICD-10-CM | POA: Diagnosis not present

## 2017-03-19 DIAGNOSIS — F17210 Nicotine dependence, cigarettes, uncomplicated: Secondary | ICD-10-CM | POA: Insufficient documentation

## 2017-03-19 DIAGNOSIS — S43421A Sprain of right rotator cuff capsule, initial encounter: Secondary | ICD-10-CM | POA: Diagnosis not present

## 2017-03-19 HISTORY — PX: SHOULDER OPEN ROTATOR CUFF REPAIR: SHX2407

## 2017-03-19 LAB — CREATININE, SERUM
Creatinine, Ser: 0.91 mg/dL (ref 0.61–1.24)
GFR calc Af Amer: >60 mL/min (ref >60)

## 2017-03-19 LAB — CBC
HEMATOCRIT: 42.1 % (ref 39.0–52.0)
Hemoglobin: 14.7 g/dL (ref 13.0–17.0)
MCH: 34.4 pg — ABNORMAL HIGH (ref 26.0–34.0)
MCHC: 34.9 g/dL (ref 30.0–36.0)
MCV: 98.6 fL (ref 78.0–100.0)
PLATELETS: 181 10*3/uL (ref 150–400)
RBC: 4.27 MIL/uL (ref 4.22–5.81)
RDW: 13 % (ref 11.5–15.5)
WBC: 12 10*3/uL — ABNORMAL HIGH (ref 4.0–10.5)

## 2017-03-19 SURGERY — REPAIR, ROTATOR CUFF, OPEN
Anesthesia: General | Site: Shoulder | Laterality: Right

## 2017-03-19 MED ORDER — POLYETHYLENE GLYCOL 3350 17 G PO PACK: 17.0000 g | PACK | Freq: Every day | ORAL | Status: DC | PRN

## 2017-03-19 MED ORDER — TRIAMTERENE-HCTZ 37.5-25 MG PO TABS
1.0000 | ORAL_TABLET | Freq: Every day | ORAL | Status: DC
  Administered 2017-03-19 – 2017-03-20 (×2): 1 via ORAL
  Filled 2017-03-19 (×2): qty 1

## 2017-03-19 MED ORDER — ENOXAPARIN SODIUM 40 MG/0.4ML ~~LOC~~ SOLN
40.0000 mg | SUBCUTANEOUS | Status: DC
  Administered 2017-03-20: 40 mg via SUBCUTANEOUS
  Filled 2017-03-19: qty 0.4

## 2017-03-19 MED ORDER — BUPIVACAINE-EPINEPHRINE 0.5% -1:200000 IJ SOLN
INTRAMUSCULAR | Status: DC | PRN
  Administered 2017-03-19: 30 mL

## 2017-03-19 MED ORDER — FENTANYL CITRATE (PF) 100 MCG/2ML IJ SOLN
INTRAMUSCULAR | Status: DC | PRN
  Administered 2017-03-19 (×3): 50 ug via INTRAVENOUS

## 2017-03-19 MED ORDER — ROCURONIUM BROMIDE 50 MG/5ML IV SOSY
PREFILLED_SYRINGE | INTRAVENOUS | Status: DC | PRN
  Administered 2017-03-19 (×2): 10 mg via INTRAVENOUS
  Administered 2017-03-19: 50 mg via INTRAVENOUS

## 2017-03-19 MED ORDER — ALUM & MAG HYDROXIDE-SIMETH 200-200-20 MG/5ML PO SUSP
30.0000 mL | ORAL | Status: DC | PRN
  Administered 2017-03-20: 30 mL via ORAL
  Filled 2017-03-19: qty 30

## 2017-03-19 MED ORDER — DOCUSATE SODIUM 100 MG PO CAPS
100.0000 mg | ORAL_CAPSULE | Freq: Two times a day (BID) | ORAL | Status: DC
Start: 2017-03-19 — End: 2017-03-20
  Administered 2017-03-19 – 2017-03-20 (×2): 100 mg via ORAL
  Filled 2017-03-19: qty 1

## 2017-03-19 MED ORDER — MENTHOL 3 MG MT LOZG: 1.0000 | LOZENGE | OROMUCOSAL | Status: DC | PRN

## 2017-03-19 MED ORDER — ROCURONIUM BROMIDE 50 MG/5ML IV SOSY
PREFILLED_SYRINGE | INTRAVENOUS | Status: AC
  Filled 2017-03-19: qty 5

## 2017-03-19 MED ORDER — MIDAZOLAM HCL 2 MG/2ML IJ SOLN
INTRAMUSCULAR | Status: AC
Start: 2017-03-19 — End: ?
  Filled 2017-03-19: qty 2

## 2017-03-19 MED ORDER — LACTATED RINGERS IV SOLN
INTRAVENOUS | Status: DC
  Administered 2017-03-19: 07:00:00 via INTRAVENOUS

## 2017-03-19 MED ORDER — HYDROMORPHONE HCL 1 MG/ML IJ SOLN: 1.0000 mg | INTRAMUSCULAR | Status: DC | PRN

## 2017-03-19 MED ORDER — POLYETHYLENE GLYCOL 3350 17 G PO PACK: 17.0000 g | PACK | Freq: Every day | ORAL | 0 refills | Status: DC

## 2017-03-19 MED ORDER — CEFAZOLIN SODIUM-DEXTROSE 2-4 GM/100ML-% IV SOLN
2.0000 g | Freq: Four times a day (QID) | INTRAVENOUS | Status: AC
Start: 2017-03-19 — End: 2017-03-19
  Administered 2017-03-19 (×2): 2 g via INTRAVENOUS
  Filled 2017-03-19 (×2): qty 100

## 2017-03-19 MED ORDER — METOCLOPRAMIDE HCL 5 MG PO TABS: 5.0000 mg | ORAL_TABLET | Freq: Three times a day (TID) | ORAL | Status: DC | PRN

## 2017-03-19 MED ORDER — FENTANYL CITRATE (PF) 100 MCG/2ML IJ SOLN
100.0000 ug | Freq: Once | INTRAMUSCULAR | Status: AC
  Administered 2017-03-19: 100 ug via INTRAVENOUS

## 2017-03-19 MED ORDER — DEXAMETHASONE SODIUM PHOSPHATE 10 MG/ML IJ SOLN
INTRAMUSCULAR | Status: AC
  Filled 2017-03-19: qty 1

## 2017-03-19 MED ORDER — ONDANSETRON HCL 4 MG/2ML IJ SOLN
INTRAMUSCULAR | Status: AC
  Filled 2017-03-19: qty 2

## 2017-03-19 MED ORDER — NICOTINE 14 MG/24HR TD PT24
14.0000 mg | MEDICATED_PATCH | Freq: Every day | TRANSDERMAL | Status: DC
  Administered 2017-03-19 – 2017-03-20 (×2): 14 mg via TRANSDERMAL
  Filled 2017-03-19 (×2): qty 1

## 2017-03-19 MED ORDER — DOCUSATE SODIUM 100 MG PO CAPS: 100.0000 mg | ORAL_CAPSULE | Freq: Two times a day (BID) | ORAL | 1 refills | Status: DC | PRN

## 2017-03-19 MED ORDER — KCL IN DEXTROSE-NACL 20-5-0.45 MEQ/L-%-% IV SOLN
INTRAVENOUS | Status: DC
  Administered 2017-03-19: 15:00:00 via INTRAVENOUS
  Filled 2017-03-19 (×2): qty 1000

## 2017-03-19 MED ORDER — OXYCODONE-ACETAMINOPHEN 10-325 MG PO TABS: 1.0000 | ORAL_TABLET | ORAL | 0 refills | Status: DC | PRN

## 2017-03-19 MED ORDER — MAGNESIUM CITRATE PO SOLN: 1.0000 | Freq: Once | ORAL | Status: DC | PRN

## 2017-03-19 MED ORDER — ALBUTEROL SULFATE (2.5 MG/3ML) 0.083% IN NEBU
INHALATION_SOLUTION | RESPIRATORY_TRACT | Status: AC
  Administered 2017-03-19: 2.5 mg
  Filled 2017-03-19: qty 3

## 2017-03-19 MED ORDER — LABETALOL HCL 5 MG/ML IV SOLN
INTRAVENOUS | Status: DC | PRN
  Administered 2017-03-19: 2.5 mg via INTRAVENOUS

## 2017-03-19 MED ORDER — RISAQUAD PO CAPS
1.0000 | ORAL_CAPSULE | Freq: Every day | ORAL | Status: DC
  Administered 2017-03-19 – 2017-03-20 (×2): 1 via ORAL
  Filled 2017-03-19 (×2): qty 1

## 2017-03-19 MED ORDER — ONDANSETRON HCL 4 MG/2ML IJ SOLN
INTRAMUSCULAR | Status: DC | PRN
  Administered 2017-03-19: 4 mg via INTRAVENOUS

## 2017-03-19 MED ORDER — PROPOFOL 10 MG/ML IV BOLUS
INTRAVENOUS | Status: AC
  Filled 2017-03-19: qty 20

## 2017-03-19 MED ORDER — DIPHENHYDRAMINE HCL 12.5 MG/5ML PO ELIX: 12.5000 mg | ORAL_SOLUTION | ORAL | Status: DC | PRN

## 2017-03-19 MED ORDER — PHENYLEPHRINE HCL-NACL 10-0.9 MG/250ML-% IV SOLN
INTRAVENOUS | Status: AC
  Filled 2017-03-19: qty 250

## 2017-03-19 MED ORDER — SUGAMMADEX SODIUM 200 MG/2ML IV SOLN
INTRAVENOUS | Status: AC
  Filled 2017-03-19: qty 2

## 2017-03-19 MED ORDER — SODIUM CHLORIDE 0.9 % IV SOLN
INTRAVENOUS | Status: DC | PRN
  Administered 2017-03-19: 500 mL

## 2017-03-19 MED ORDER — LIDOCAINE 2% (20 MG/ML) 5 ML SYRINGE
INTRAMUSCULAR | Status: AC
  Filled 2017-03-19: qty 5

## 2017-03-19 MED ORDER — SUGAMMADEX SODIUM 200 MG/2ML IV SOLN
INTRAVENOUS | Status: DC | PRN
  Administered 2017-03-19: 200 mg via INTRAVENOUS

## 2017-03-19 MED ORDER — FENTANYL CITRATE (PF) 250 MCG/5ML IJ SOLN
INTRAMUSCULAR | Status: AC
  Filled 2017-03-19: qty 5

## 2017-03-19 MED ORDER — ONDANSETRON HCL 4 MG PO TABS: 4.0000 mg | ORAL_TABLET | Freq: Four times a day (QID) | ORAL | Status: DC | PRN

## 2017-03-19 MED ORDER — ONDANSETRON HCL 4 MG/2ML IJ SOLN: 4.0000 mg | Freq: Four times a day (QID) | INTRAMUSCULAR | Status: DC | PRN

## 2017-03-19 MED ORDER — METHOCARBAMOL 500 MG PO TABS: 500.0000 mg | ORAL_TABLET | Freq: Four times a day (QID) | ORAL | 1 refills | Status: DC | PRN

## 2017-03-19 MED ORDER — GABAPENTIN 300 MG PO CAPS
600.0000 mg | ORAL_CAPSULE | Freq: Every day | ORAL | Status: DC
  Administered 2017-03-19: 600 mg via ORAL
  Filled 2017-03-19: qty 2

## 2017-03-19 MED ORDER — METHOCARBAMOL 500 MG PO TABS
500.0000 mg | ORAL_TABLET | Freq: Four times a day (QID) | ORAL | Status: DC | PRN
  Administered 2017-03-20 (×2): 500 mg via ORAL
  Filled 2017-03-19 (×2): qty 1

## 2017-03-19 MED ORDER — ALBUTEROL SULFATE HFA 108 (90 BASE) MCG/ACT IN AERS
INHALATION_SPRAY | RESPIRATORY_TRACT | Status: DC | PRN
  Administered 2017-03-19 (×3): 2 via RESPIRATORY_TRACT

## 2017-03-19 MED ORDER — ACETAMINOPHEN 325 MG PO TABS: 650.0000 mg | ORAL_TABLET | Freq: Four times a day (QID) | ORAL | Status: DC | PRN

## 2017-03-19 MED ORDER — SODIUM CHLORIDE 0.9 % IV SOLN
INTRAVENOUS | Status: AC
  Filled 2017-03-19: qty 500000

## 2017-03-19 MED ORDER — MOMETASONE FURO-FORMOTEROL FUM 200-5 MCG/ACT IN AERO
2.0000 | INHALATION_SPRAY | Freq: Two times a day (BID) | RESPIRATORY_TRACT | Status: DC
  Administered 2017-03-19 – 2017-03-20 (×2): 2 via RESPIRATORY_TRACT
  Filled 2017-03-19: qty 8.8

## 2017-03-19 MED ORDER — ALBUTEROL SULFATE (2.5 MG/3ML) 0.083% IN NEBU
2.5000 mg | INHALATION_SOLUTION | Freq: Three times a day (TID) | RESPIRATORY_TRACT | Status: DC
  Administered 2017-03-20: 2.5 mg via RESPIRATORY_TRACT
  Filled 2017-03-19 (×2): qty 3

## 2017-03-19 MED ORDER — FENTANYL CITRATE (PF) 100 MCG/2ML IJ SOLN
INTRAMUSCULAR | Status: AC
  Filled 2017-03-19: qty 2

## 2017-03-19 MED ORDER — PHENOL 1.4 % MT LIQD: 1.0000 | OROMUCOSAL | Status: DC | PRN

## 2017-03-19 MED ORDER — METHOCARBAMOL 1000 MG/10ML IJ SOLN
500.0000 mg | Freq: Four times a day (QID) | INTRAVENOUS | Status: DC | PRN
  Filled 2017-03-19: qty 5

## 2017-03-19 MED ORDER — GABAPENTIN 600 MG PO TABS
600.0000 mg | ORAL_TABLET | Freq: Every day | ORAL | Status: DC
  Filled 2017-03-19: qty 1

## 2017-03-19 MED ORDER — CEFAZOLIN SODIUM-DEXTROSE 2-4 GM/100ML-% IV SOLN
2.0000 g | INTRAVENOUS | Status: AC
  Administered 2017-03-19: 2 g via INTRAVENOUS
  Filled 2017-03-19: qty 100

## 2017-03-19 MED ORDER — DOCUSATE SODIUM 100 MG PO CAPS
100.0000 mg | ORAL_CAPSULE | Freq: Two times a day (BID) | ORAL | Status: DC | PRN
  Administered 2017-03-20: 100 mg via ORAL
  Filled 2017-03-19: qty 1

## 2017-03-19 MED ORDER — ALBUTEROL SULFATE (2.5 MG/3ML) 0.083% IN NEBU
2.5000 mg | INHALATION_SOLUTION | Freq: Four times a day (QID) | RESPIRATORY_TRACT | Status: DC | PRN
  Administered 2017-03-19: 2.5 mg via RESPIRATORY_TRACT
  Filled 2017-03-19: qty 3

## 2017-03-19 MED ORDER — MIDAZOLAM HCL 5 MG/ML IJ SOLN
2.0000 mg | Freq: Once | INTRAMUSCULAR | Status: AC
  Administered 2017-03-19: 2 mg via INTRAVENOUS

## 2017-03-19 MED ORDER — OXYCODONE HCL 5 MG PO TABS
5.0000 mg | ORAL_TABLET | ORAL | Status: DC | PRN
  Administered 2017-03-19 – 2017-03-20 (×5): 5 mg via ORAL
  Filled 2017-03-19: qty 1
  Filled 2017-03-19: qty 2
  Filled 2017-03-19 (×2): qty 1
  Filled 2017-03-19: qty 2
  Filled 2017-03-19: qty 1

## 2017-03-19 MED ORDER — DEXAMETHASONE SODIUM PHOSPHATE 10 MG/ML IJ SOLN
INTRAMUSCULAR | Status: DC | PRN
  Administered 2017-03-19: 10 mg via INTRAVENOUS

## 2017-03-19 MED ORDER — METOCLOPRAMIDE HCL 5 MG/ML IJ SOLN: 5.0000 mg | Freq: Three times a day (TID) | INTRAMUSCULAR | Status: DC | PRN

## 2017-03-19 MED ORDER — LIDOCAINE 2% (20 MG/ML) 5 ML SYRINGE
INTRAMUSCULAR | Status: DC | PRN
  Administered 2017-03-19: 20 mg via INTRAVENOUS

## 2017-03-19 MED ORDER — BISACODYL 5 MG PO TBEC: 5.0000 mg | DELAYED_RELEASE_TABLET | Freq: Every day | ORAL | Status: DC | PRN

## 2017-03-19 MED ORDER — PROPOFOL 10 MG/ML IV BOLUS
INTRAVENOUS | Status: DC | PRN
  Administered 2017-03-19: 160 mg via INTRAVENOUS
  Administered 2017-03-19: 40 mg via INTRAVENOUS

## 2017-03-19 MED ORDER — ACETAMINOPHEN 650 MG RE SUPP: 650.0000 mg | Freq: Four times a day (QID) | RECTAL | Status: DC | PRN

## 2017-03-19 MED ORDER — BUPIVACAINE-EPINEPHRINE (PF) 0.5% -1:200000 IJ SOLN
INTRAMUSCULAR | Status: AC
  Filled 2017-03-19: qty 30

## 2017-03-19 SURGICAL SUPPLY — 51 items
ANCHOR NEEDLE 9/16 CIR SZ 8 (NEEDLE) IMPLANT
ANCHOR PEEK 4.75X19.1 SWLK C (Anchor) ×8 IMPLANT
BAG ZIPLOCK 12X15 (MISCELLANEOUS) IMPLANT
BLADE 10 SAFETY STRL DISP (BLADE) ×2 IMPLANT
BLADE 15 SAFETY STRL DISP (BLADE) ×2 IMPLANT
BLADE OSCILLATING/SAGITTAL (BLADE)
BLADE SW THK.38XMED LNG THN (BLADE) IMPLANT
CLOTH 2% CHLOROHEXIDINE 3PK (PERSONAL CARE ITEMS) ×2 IMPLANT
COVER SURGICAL LIGHT HANDLE (MISCELLANEOUS) ×2 IMPLANT
DRAPE POUCH INSTRU U-SHP 10X18 (DRAPES) ×2 IMPLANT
DRAPE SHEET LG 3/4 BI-LAMINATE (DRAPES) ×2 IMPLANT
DRSG AQUACEL AG ADV 3.5X 4 (GAUZE/BANDAGES/DRESSINGS) ×2 IMPLANT
DRSG AQUACEL AG ADV 3.5X 6 (GAUZE/BANDAGES/DRESSINGS) IMPLANT
DURAPREP 26ML APPLICATOR (WOUND CARE) ×2 IMPLANT
ELECT NEEDLE TIP 2.8 STRL (NEEDLE) ×2 IMPLANT
ELECT REM PT RETURN 15FT ADLT (MISCELLANEOUS) ×2 IMPLANT
GLOVE BIOGEL M 7.0 STRL (GLOVE) ×2 IMPLANT
GLOVE BIOGEL PI IND STRL 7.0 (GLOVE) ×1 IMPLANT
GLOVE BIOGEL PI IND STRL 8 (GLOVE) ×1 IMPLANT
GLOVE BIOGEL PI INDICATOR 7.0 (GLOVE) ×1
GLOVE BIOGEL PI INDICATOR 8 (GLOVE) ×1
GLOVE SURG SS PI 7.0 STRL IVOR (GLOVE) IMPLANT
GLOVE SURG SS PI 7.5 STRL IVOR (GLOVE) IMPLANT
GLOVE SURG SS PI 8.0 STRL IVOR (GLOVE) ×2 IMPLANT
GOWN STRL REUS W/TWL XL LVL3 (GOWN DISPOSABLE) ×4 IMPLANT
KIT BASIN OR (CUSTOM PROCEDURE TRAY) ×2 IMPLANT
KIT POSITION SHOULDER SCHLEI (MISCELLANEOUS) ×2 IMPLANT
MANIFOLD NEPTUNE II (INSTRUMENTS) ×2 IMPLANT
MARKER SKIN DUAL TIP RULER LAB (MISCELLANEOUS) ×2 IMPLANT
NEEDLE SCORPION MULTI FIRE (NEEDLE) ×2 IMPLANT
PACK SHOULDER (CUSTOM PROCEDURE TRAY) ×2 IMPLANT
POSITIONER SURGICAL ARM (MISCELLANEOUS) ×2 IMPLANT
SLING ARM IMMOBILIZER LRG (SOFTGOODS) IMPLANT
SLING ULTRA II L (ORTHOPEDIC SUPPLIES) ×2 IMPLANT
SPONGE LAP 4X18 X RAY DECT (DISPOSABLE) ×2 IMPLANT
STOCKINETTE 8 INCH (MISCELLANEOUS) ×2 IMPLANT
STRIP CLOSURE SKIN 1/2X4 (GAUZE/BANDAGES/DRESSINGS) ×2 IMPLANT
SUCTION FRAZIER HANDLE 12FR (TUBING) ×1
SUCTION TUBE FRAZIER 12FR DISP (TUBING) ×1 IMPLANT
SUT BONE WAX W31G (SUTURE) IMPLANT
SUT ETHIBOND NAB CT1 #1 30IN (SUTURE) IMPLANT
SUT FIBERWIRE #2 38 T-5 BLUE (SUTURE)
SUT PROLENE 3 0 PS 2 (SUTURE) ×2 IMPLANT
SUT TIGER TAPE 7 IN WHITE (SUTURE) ×4 IMPLANT
SUT VIC AB 1-0 CT2 27 (SUTURE) ×2 IMPLANT
SUT VIC AB 2-0 CT2 27 (SUTURE) ×2 IMPLANT
SUTURE FIBERWR #2 38 T-5 BLUE (SUTURE) IMPLANT
TAPE FIBER 2MM 7IN #2 BLUE (SUTURE) IMPLANT
TOWEL OR 17X26 10 PK STRL BLUE (TOWEL DISPOSABLE) ×2 IMPLANT
TOWEL OR NON WOVEN STRL DISP B (DISPOSABLE) ×2 IMPLANT
YANKAUER SUCT BULB TIP NO VENT (SUCTIONS) IMPLANT

## 2017-03-19 NOTE — Anesthesia Preprocedure Evaluation (Addendum)
Anesthesia Evaluation  Patient identified by MRN, date of birth, ID band Patient awake    Reviewed: Allergy & Precautions, NPO status , Patient's Chart, lab work & pertinent test results  Airway Mallampati: II  TM Distance: >3 FB     Dental   Pulmonary shortness of breath, COPD, Current Smoker,    breath sounds clear to auscultation       Cardiovascular hypertension,  Rhythm:Regular Rate:Normal     Neuro/Psych  Neuromuscular disease    GI/Hepatic Neg liver ROS, GERD  ,  Endo/Other  negative endocrine ROS  Renal/GU negative Renal ROS     Musculoskeletal   Abdominal   Peds  Hematology   Anesthesia Other Findings   Reproductive/Obstetrics                             Anesthesia Physical Anesthesia Plan  ASA: III  Anesthesia Plan: General and Regional   Post-op Pain Management:  Regional for Post-op pain   Induction: Intravenous  PONV Risk Score and Plan: 2 and Ondansetron, Propofol and Midazolam  Airway Management Planned: Oral ETT  Additional Equipment:   Intra-op Plan:   Post-operative Plan: Extubation in OR  Informed Consent: I have reviewed the patients History and Physical, chart, labs and discussed the procedure including the risks, benefits and alternatives for the proposed anesthesia with the patient or authorized representative who has indicated his/her understanding and acceptance.     Plan Discussed with: CRNA and Anesthesiologist  Anesthesia Plan Comments:        Anesthesia Quick Evaluation

## 2017-03-19 NOTE — Brief Op Note (Signed)
03/19/2017  10:53 AM  PATIENT:  Mark Stevenson  61 y.o. male  PRE-OPERATIVE DIAGNOSIS:  Rotator cuff tear right shoulder  POST-OPERATIVE DIAGNOSIS:  Rotator cuff tear right shoulder  PROCEDURE:  Procedure(s): Mini open rotator cuff repair (Right)  SURGEON:  Surgeon(s) and Role:    Susa Day, MD - Primary  PHYSICIAN ASSISTANT:   ASSISTANTSMancel Bale   ANESTHESIA:   general  EBL:  Total I/O In: -  Out: 50 [Blood:50]  BLOOD ADMINISTERED:none  DRAINS: none   LOCAL MEDICATIONS USED:  MARCAINE     SPECIMEN:  No Specimen  DISPOSITION OF SPECIMEN:  N/A  COUNTS:  yes  TOURNIQUET:  * No tourniquets in log *  DICTATION: .Other Dictation: Dictation Number [id number]  PLAN OF CARE: Admit for overnight observation  PATIENT DISPOSITION:  PACU - hemodynamically stable.   Delay start of Pharmacological VTE agent (>24hrs) due to surgical blood loss or risk of bleeding: no

## 2017-03-19 NOTE — Anesthesia Postprocedure Evaluation (Signed)
Anesthesia Post Note  Patient: Mark Stevenson  Procedure(s) Performed: Procedure(s) (LRB): Mini open rotator cuff repair (Right)     Patient location during evaluation: PACU Anesthesia Type: General Level of consciousness: awake Pain management: pain level controlled Vital Signs Assessment: post-procedure vital signs reviewed and stable Respiratory status: spontaneous breathing Cardiovascular status: stable Anesthetic complications: no    Last Vitals:  Vitals:   03/19/17 1450 03/19/17 1624  BP: (!) 173/91 (!) 160/99  Pulse: 85 91  Resp: (!) 26   Temp: 36.7 C     Last Pain:  Vitals:   03/19/17 1450  TempSrc: Axillary                 Anyiah Coverdale

## 2017-03-19 NOTE — Discharge Instructions (Signed)
Aquacel dressing may remain in place until follow up. May shower with aquacel dressing in place. If the dressing becomes saturated or peels off, you may remove it and place a new dressing with gauze and tape which should be kept clean and dry and changed daily. Use sling at times except when showering use ball or small inflatable  No driving for 4-6 weeks No lifting for 6 weeks operative arm. Ok to move wrist, elbow, and hand. See Dr. Tonita Cong in 10-14 days. Take one aspirin per day with a meal if not on a blood thinner or allergic to aspirin.

## 2017-03-19 NOTE — Anesthesia Procedure Notes (Signed)
Procedure Name: Intubation Date/Time: 03/19/2017 9:37 AM Performed by: West Pugh Pre-anesthesia Checklist: Patient identified, Emergency Drugs available, Suction available, Patient being monitored and Timeout performed Patient Re-evaluated:Patient Re-evaluated prior to inductionOxygen Delivery Method: Circle system utilized Preoxygenation: Pre-oxygenation with 100% oxygen Intubation Type: IV induction Ventilation: Mask ventilation without difficulty Laryngoscope Size: Mac and 4 Grade View: Grade I Tube type: Oral Tube size: 7.5 mm Number of attempts: 1 Airway Equipment and Method: Stylet Placement Confirmation: ETT inserted through vocal cords under direct vision,  positive ETCO2,  CO2 detector and breath sounds checked- equal and bilateral Secured at: 23 cm Tube secured with: Tape Dental Injury: Teeth and Oropharynx as per pre-operative assessment

## 2017-03-19 NOTE — Progress Notes (Signed)
Patient complaints of being nervous, thinks its because he hasnt had a cigarette. Kirt Boys paged orders received  D  Mateo Flow RN

## 2017-03-19 NOTE — Interval H&P Note (Signed)
History and Physical Interval Note:  03/19/2017 7:21 AM  Mark Stevenson  has presented today for surgery, with the diagnosis of Rotator cuff tear right shoulder  The various methods of treatment have been discussed with the patient and family. After consideration of risks, benefits and other options for treatment, the patient has consented to  Procedure(s): Mini open rotator cuff repair, possible patch graft, possible biceps tenodesis (Right) as a surgical intervention .  The patient's history has been reviewed, patient examined, no change in status, stable for surgery.  I have reviewed the patient's chart and labs.  Questions were answered to the patient's satisfaction.     Elinore Shults C

## 2017-03-19 NOTE — Anesthesia Procedure Notes (Signed)
Anesthesia Regional Block: Interscalene brachial plexus block   Pre-Anesthetic Checklist: ,, timeout performed, Correct Patient, Correct Site, Correct Laterality, Correct Procedure, Correct Position, site marked, Risks and benefits discussed,  Surgical consent,  Pre-op evaluation,  At surgeon's request and post-op pain management  Laterality: Right  Prep: chloraprep       Needles:   Needle Type: Stimulator Needle - 40          Additional Needles:   Procedures: Doppler guided, Ultrasound guided, Nerve stimulator,,,,   Nerve Stimulator or Paresthesia:  Response: 0.5 mA,   Additional Responses:   Narrative:  Start time: 03/19/2017 8:45 AM End time: 03/19/2017 9:00 AM Injection made incrementally with aspirations every 5 mL.  Performed by: Personally  Anesthesiologist: Belinda Block

## 2017-03-19 NOTE — Progress Notes (Signed)
Assisted Dr. Edwards with right, ultrasound guided, interscalene  block. Side rails up, monitors on throughout procedure. See vital signs in flow sheet. Tolerated Procedure well. 

## 2017-03-19 NOTE — Anesthesia Postprocedure Evaluation (Signed)
Anesthesia Post Note  Patient: Mark Stevenson  Procedure(s) Performed: Procedure(s) (LRB): Mini open rotator cuff repair (Right)     Patient location during evaluation: PACU Anesthesia Type: General Level of consciousness: awake Pain management: pain level controlled Vital Signs Assessment: post-procedure vital signs reviewed and stable Cardiovascular status: stable Anesthetic complications: no    Last Vitals:  Vitals:   03/19/17 1145 03/19/17 1200  BP: (!) 148/101 (!) 158/102  Pulse: 80 83  Resp: (!) 25 (!) 21  Temp:      Last Pain: There were no vitals filed for this visit.               Yolunda Kloos

## 2017-03-19 NOTE — Transfer of Care (Signed)
Immediate Anesthesia Transfer of Care Note  Patient: Mark Stevenson  Procedure(s) Performed: Procedure(s) with comments: Mini open rotator cuff repair (Right) - with block  Patient Location: PACU  Anesthesia Type:General and Regional  Level of Consciousness:  sedated, patient cooperative and responds to stimulation  Airway & Oxygen Therapy:Patient Spontanous Breathing and Patient connected to face mask oxgen  Post-op Assessment:  Report given to PACU RN and Post -op Vital signs reviewed and stable  Post vital signs:  Reviewed and stable  Last Vitals:  Vitals:   03/19/17 0921 03/19/17 0922  BP:    Pulse: 86 92  Resp: (!) 32 (!) 21    Complications: No apparent anesthesia complications

## 2017-03-20 DIAGNOSIS — S43421A Sprain of right rotator cuff capsule, initial encounter: Secondary | ICD-10-CM | POA: Diagnosis not present

## 2017-03-20 LAB — BASIC METABOLIC PANEL
Anion gap: 16 — ABNORMAL HIGH (ref 5–15)
BUN: 13 mg/dL (ref 6–20)
CALCIUM: 8.9 mg/dL (ref 8.9–10.3)
CO2: 21 mmol/L — ABNORMAL LOW (ref 22–32)
CREATININE: 0.86 mg/dL (ref 0.61–1.24)
Chloride: 98 mmol/L — ABNORMAL LOW (ref 101–111)
Glucose, Bld: 163 mg/dL — ABNORMAL HIGH (ref 65–99)
Potassium: 3.6 mmol/L (ref 3.5–5.1)
SODIUM: 135 mmol/L (ref 135–145)

## 2017-03-20 LAB — CBC
HEMATOCRIT: 42.4 % (ref 39.0–52.0)
HEMOGLOBIN: 14.8 g/dL (ref 13.0–17.0)
MCH: 34.3 pg — AB (ref 26.0–34.0)
MCHC: 34.9 g/dL (ref 30.0–36.0)
MCV: 98.1 fL (ref 78.0–100.0)
Platelets: 188 10*3/uL (ref 150–400)
RBC: 4.32 MIL/uL (ref 4.22–5.81)
RDW: 12.8 % (ref 11.5–15.5)
WBC: 13.5 10*3/uL — ABNORMAL HIGH (ref 4.0–10.5)

## 2017-03-20 NOTE — Progress Notes (Signed)
Subjective: 1 Day Post-Op Procedure(s) (LRB): Mini open rotator cuff repair (Right) Patient reports pain as 4 on 0-10 scale.    Objective: Vital signs in last 24 hours: Temp:  [97.2 F (36.2 C)-98.6 F (37 C)] 98.4 F (36.9 C) (06/22 0553) Pulse Rate:  [78-91] 80 (06/22 0553) Resp:  [21-26] 22 (06/22 0553) BP: (148-180)/(90-109) 160/97 (06/22 0553) SpO2:  [94 %-100 %] 97 % (06/22 0553) Weight:  [95.7 kg (211 lb)] 95.7 kg (211 lb) (06/21 1240)  Intake/Output from previous day: 06/21 0701 - 06/22 0700 In: 4246.7 [P.O.:2040; I.V.:2206.7] Out: 1450 [Urine:1400; Blood:50] Intake/Output this shift: No intake/output data recorded.   Recent Labs  03/19/17 1327 03/20/17 0439  HGB 14.7 14.8    Recent Labs  03/19/17 1327 03/20/17 0439  WBC 12.0* 13.5*  RBC 4.27 4.32  HCT 42.1 42.4  PLT 181 188    Recent Labs  03/19/17 1327 03/20/17 0439  NA  --  135  K  --  3.6  CL  --  98*  CO2  --  21*  BUN  --  13  CREATININE 0.91 0.86  GLUCOSE  --  163*  CALCIUM  --  8.9   No results for input(s): LABPT, INR in the last 72 hours.  Neurologically intact Neurovascular intact Sensation intact distally Incision: dressing C/D/I  Assessment/Plan: 1 Day Post-Op Procedure(s) (LRB): Mini open rotator cuff repair (Right) Advance diet Up with therapy D/C IV fluids Discharge home with home health  BLAIR Anhthu Perdew 03/20/2017, 9:39 AM

## 2017-03-20 NOTE — Discharge Summary (Signed)
Patient ID: Mark Stevenson MRN: [id number] DOB/AGE: [birth year] 62 y.o.  Admit date: 03/19/2017 Discharge date: 03/20/2017  Admission Diagnoses:  Active Problems:   Complete rotator cuff tear   Discharge Diagnoses:  Same  Past Medical History:  Diagnosis Date  . Arthritis   . Cancer (HCC)    skin cancer bil knees  . COPD (chronic obstructive pulmonary disease) ([place])   . Dyspnea    increased exertion; pt states can walk a flight of stairs w/o having to stop to catch breath   . GERD (gastroesophageal reflux disease)    occ  . Hypertension   . Nicotine dependence, cigarettes, uncomplicated   . Peripheral neuropathy   . Pure hypercholesterolemia   . Sebaceous cyst    scrotal area   . Viral wart     Surgeries: Procedure(s): Mini open rotator cuff repair on 03/19/2017   Consultants:   Discharged Condition: Improved  Hospital Course: Mark Stevenson is an 61 y.o. male who was admitted 03/19/2017 for operative treatment of<principal problem not specified>. Patient has severe unremitting pain that affects sleep, daily activities, and work/hobbies. After pre-op clearance the patient was taken to the operating room on 03/19/2017 and underwent  Procedure(s): Mini open rotator cuff repair.    Patient was given perioperative antibiotics: Anti-infectives    Start     Dose/Rate Route Frequency Ordered Stop   03/19/17 1500  ceFAZolin (ANCEF) IVPB 2g/100 mL premix     2 g 200 mL/hr over 30 Minutes Intravenous Every 6 hours 03/19/17 1246 03/19/17 2225   03/19/17 1001  polymyxin B 500,000 Units, bacitracin 50,000 Units in sodium chloride 0.9 % 500 mL irrigation  Status:  Discontinued       As needed 03/19/17 1001 03/19/17 1119   03/19/17 0643  ceFAZolin (ANCEF) IVPB 2g/100 mL premix     2 g 200 mL/hr over 30 Minutes Intravenous On call to O.R. 03/19/17 3570 03/19/17 1008       Patient was given sequential compression devices, early ambulation, and chemoprophylaxis to prevent  DVT.  Patient benefited maximally from hospital stay and there were no complications.    Recent vital signs: Patient Vitals for the past 24 hrs:  BP Temp Temp src Pulse Resp SpO2 Height Weight  03/20/17 1028 (!) 157/92 98.7 F (37.1 C) Oral 76 19 93 % - -  03/20/17 0958 - - - - - 95 % - -  03/20/17 0553 (!) 160/97 98.4 F (36.9 C) Oral 80 (!) 22 97 % - -  03/20/17 0255 (!) 150/96 - - - - - - -  03/20/17 0232 (!) 152/104 98.1 F (36.7 C) Oral - (!) 22 96 % - -  03/19/17 2212 (!) 156/96 98.6 F (37 C) Oral 89 (!) 21 98 % - -  03/19/17 1940 - - - - - 96 % - -  03/19/17 1856 (!) 162/93 97.9 F (36.6 C) Oral 84 (!) 26 98 % - -  03/19/17 1715 - - - - - 98 % - -  03/19/17 1711 - - - - - 98 % - -  03/19/17 1624 (!) 160/99 - - 91 - - - -  03/19/17 1450 (!) 173/91 98.1 F (36.7 C) Axillary 85 (!) 26 98 % - -  03/19/17 1341 (!) 167/90 97.7 F (36.5 C) Axillary 87 (!) 26 98 % - -  03/19/17 1240 (!) 164/98 97.9 F (36.6 C) - 81 (!) 22 98 % 6' (1.829 m) 95.7 kg (211 lb)  03/19/17 1235 (!) 167/98 97.9 F (36.6 C) - 81 - 96 % - -  03/19/17 1215 (!) 155/98 97.2 F (36.2 C) - 81 (!) 22 95 % - -     Recent laboratory studies:  Recent Labs  03/19/17 1327 03/20/17 0439  WBC 12.0* 13.5*  HGB 14.7 14.8  HCT 42.1 42.4  PLT 181 188  NA  --  135  K  --  3.6  CL  --  98*  CO2  --  21*  BUN  --  13  CREATININE 0.91 0.86  GLUCOSE  --  163*  CALCIUM  --  8.9     Discharge Medications:   Allergies as of 03/20/2017   No Known Allergies     Medication List    STOP taking these medications   aspirin EC 81 MG tablet   Na Sulfate-K Sulfate-Mg Sulf 17.5-3.13-1.6 GM/180ML Soln Commonly known as:  SUPREP BOWEL PREP KIT   oxyCODONE-acetaminophen 5-325 MG tablet Commonly known as:  PERCOCET Replaced by:  oxyCODONE-acetaminophen 10-325 MG tablet     TAKE these medications   ADVAIR DISKUS 250-50 MCG/DOSE Aepb Generic drug:  Fluticasone-Salmeterol Inhale 1 puff into the lungs 2 (two)  times daily.   docusate sodium 100 MG capsule Commonly known as:  COLACE Take 1 capsule (100 mg total) by mouth 2 (two) times daily as needed for mild constipation.   gabapentin 600 MG tablet Commonly known as:  NEURONTIN Take 600 mg by mouth at bedtime.   losartan 100 MG tablet Commonly known as:  COZAAR Take 100 mg by mouth daily.   lovastatin 20 MG tablet Commonly known as:  MEVACOR Take 20 mg by mouth daily.   methocarbamol 500 MG tablet Commonly known as:  ROBAXIN Take 1 tablet (500 mg total) by mouth every 6 (six) hours as needed for muscle spasms.   oxyCODONE-acetaminophen 10-325 MG tablet Commonly known as:  PERCOCET Take 1 tablet by mouth every 4 (four) hours as needed for pain (severe). Replaces:  oxyCODONE-acetaminophen 5-325 MG tablet   polyethylene glycol packet Commonly known as:  MIRALAX / GLYCOLAX Take 17 g by mouth daily.   PROAIR HFA 108 (90 Base) MCG/ACT inhaler Generic drug:  albuterol Inhale 2 puffs into the lungs every 6 (six) hours as needed for shortness of breath or wheezing.   triamcinolone cream 0.1 % Commonly known as:  KENALOG Apply 1 application topically daily as needed for rash.   triamterene-hydrochlorothiazide 37.5-25 MG tablet Commonly known as:  MAXZIDE-25 Take 1 tablet by mouth daily.   Vitamin D3 2000 units Tabs Take 2,000 Units by mouth daily.            Durable Medical Equipment        Start     Ordered   03/20/17 [phone]  For home use only DME Shower stool  Once     03/20/17 9758      Diagnostic Studies: No results found.  Disposition: 01-Home or Self Care  Discharge Instructions    Call MD / Call 911    Complete by:  As directed    If you experience chest pain or shortness of breath, CALL 911 and be transported to the hospital emergency room.  If you develope a fever above 101 F, pus (white drainage) or increased drainage or redness at the wound, or calf pain, call your surgeon's office.   Constipation  Prevention    Complete by:  As directed    Drink plenty of fluids.  Prune juice and/or coffee may be helpful.  You may use a stool softener, such as Colace (over the counter) 100 mg twice a day.  Use MiraLax (over the counter) for constipation as needed but this may take several days to work.  Mag Citrate --OR-- Milk of Magnesia may also be used but follow directions on the label.   Diet - low sodium heart healthy    Complete by:  As directed    Discharge instructions    Complete by:  As directed    SHOULDER ARTHROSCOPY POSTOPERATIVE INSTRUCTIONS FOR DR. Tonita Cong  1.  Ice pack on shoulder 3-4 times per day.   2  You are encouraged       to move the elbow, wrist and hand as instructed by therapist  4.  Wear shoulder immobilizer with pillow.  5.  Exercise your fingers to help reduce swelling.  6.  Report to your doctor should any of the following situations occur:   -Swelling of your fingers.  -Inability to wiggle your fingers.  -Coldness, turning pale or blueness of your fingers.  -Loss of sensation, numbness or tingling of your fingers.  -Unusual small or odor from under dressing.  -Excessive bleeding or drainage from the surgical site(s).  -Severe pain which is not relieved by the pain medication your doctor prescribed                for you.  7.  Call the office to schedule and appointment for two weeks. 8.  Take one aspirin per day 374m with a meal if not on another blood thinner or allergic after four days from discharge  Patient Signature:  ________________________________________________________  Nurse's Signature:  ________________________________________________________   Driving restrictions    Complete by:  As directed    No driving for 6 weeks   Increase activity slowly as tolerated    Complete by:  As directed    [facility]  Patient may shower    Complete by:  As directed    You may shower over the brown dressing.  Once the dressing is removed  you may shower without a dressing once there is no drainage.  Do not wash over the wound.  If drainage remains, cover wound with plastic wrap and then shower      Follow-up Information    BSusa Day MD Follow up in 2 week(s).   Specialty:  Orthopedic Surgery Contact information: [phone]           Signed: BNehemiah Massed6/22/2018, 12:01 PM

## 2017-03-20 NOTE — Op Note (Signed)
Mark Stevenson, Mark Stevenson NO.:  [id number]  MEDICAL RECORD NO.:  [id number]  LOCATION:                                 FACILITY:  PHYSICIAN:  Susa Day, M.D.         DATE OF BIRTH:  DATE OF PROCEDURE:  03/19/2017 DATE OF DISCHARGE:  03/20/2017                              OPERATIVE REPORT   PREOPERATIVE DIAGNOSIS:  Massive retracted tear of the rotator cuff.  POSTOPERATIVE DIAGNOSIS:  Massive retracted tear of the rotator cuff.  PROCEDURES PERFORMED:  Mini open rotator cuff repair with SwiveLock suture anchors of the supraspinatus and infraspinatus.  ANESTHESIA:  General.  ASSISTANT:  Nehemiah Massed.  HISTORY:  This is a gentleman 3 months status post an injury, had an MRI indicating retracted tears of the rotator cuff, supraspinatus, and infraspinatus, 4 and 5 cm.  There was no atrophy noted.  He was indicated for mini open rotator cuff repair, advancement, suture lock, anchors, and possibility of biceps tenodesis as he had a tear of the biceps tendon as well.  We discussed the risks and benefits including bleeding, infection, damage to neurovascular structures, no change in symptoms, worsening symptoms, DVT, PE, anesthetic complications, need for total shoulder, inability to repair, etc.  TECHNIQUE:  With the patient in supine beach-chair position, after induction of adequate general anesthesia and 2 g Kefzol, the right shoulder and upper extremity were prepped and draped in the usual sterile fashion.  Surgical marker was utilized to delineate the acromion, AC joint, and coracoid.  A 2.5 cm incision was made over the anterolateral aspect of the acromion.  Subcutaneous tissue was dissected with electrocautery utilized to achieve hemostasis.  The patient had a block of the right upper extremity as well.  Next, the raphe was identified and divided in line with the skin incision, preserving the deltoid attachment, partially releasing the CA ligament.  A  spur of the anterolateral aspect of the acromion was removed with a 3 mm Kerrison.  Copiously irrigated the joint.  We then resected hypertrophic bursa and identified and retracted tear of supraspinatus and infraspinatus back on the proximal portion of the glenohumeral joint.  This was meticulously mobilized posteriorly, superiorly, and anteriorly utilizing an Allis and a small Cobb, not advancing that beyond the glenohumeral joint.  Right at its attachment. We were able to advance this following this without any tension an additional 2.5 to 3 cm.  Then I felt that though it would require some resection of the articular surface laterally as I performed a bed of cancellous tissue removing 1 cm of the lateral aspect of the articular surface.  Good bleeding tissue was noted.  I placed 2 SwiveLock anchors at the lateral articular surface in the bone after securing it with an awl, insertion of the SwiveLock, and Tiger tape, 2 of them spaced 2 cm apart.  One posteriorly and one more anteriorly lateral to the bicipital groove.  A Scorpion suture passer was then utilized to pass the Tiger tape posteriorly through the infraspinatus and the posterior part of the supraspinatus as well as anteriorly through the supraspinatus, approximately 1.5 cm posteriorly, beyond its leading edge.  Leading edge had been  debrided.  We then crossed these and then secured them in a second row SwiveLocks after fashioning with an awl, inserting the ends of the suture within the second set of SwiveLocks, and then inserted them into bone.  Excellent resistance to pull-out.  Minimal tension was applied with the arm in slight abduction, 15 degrees.  We were able to reduce that.  I achieved anchorage to the cancellous surfaces without undue tension.  Next, redundant suture removed. We had good coverage and advancement.  I had felt that the biceps tendon was noted to be absent from the bicipital groove at least initially.   The portion of the subscap was partially torn as well.  There was no significant stump of the biceps tendon noted initially.  It was not within its groove, assuming that it had been retracted.  Again, there was no fracture of the distal aspect of the clavicle.  Pseudoarthrosis that we did not address.  Again, there was mild atrophy noted of the infraspinatus, but not of the supraspinatus.  The infraspinatus was not advanced as far as the supraspinatus.  After copious irrigation, inspection revealed good coverage.  We copiously irrigated the wound, closed the raphe with #1 Vicryl interrupted figure-of-8 sutures, subcu with 2-0, and skin with Prolene. Sterile dressing applied, placed an abduction pillow, extubated without difficulty, and transported to the recovery room in satisfactory condition.  The patient tolerated the procedure well.  No complications.  Assistant, Nehemiah Massed, P.A., was used throughout the case for holding the patient, traction of the extremity, closure, exposure.     Susa Day, M.D.     Geralynn Rile  D:  03/19/2017  T:  03/19/2017  Job:  [id number]

## 2017-03-20 NOTE — Progress Notes (Signed)
Occupational Therapy Treatment Patient Details Name: Mark Stevenson MRN: [id number] DOB: 1955/12/23 Today's Date: 03/20/2017    History of present illness Mini open rotator cuff repair (Right)   OT comments  Sister educated regarding donning and doffing sling as well as ADL activity .  Handout provided.  Pt did decide to use his 3 n 1 in shower rather than get a tub seat. RN aware  Follow Up Recommendations  DC plan and follow up therapy as arranged by surgeon    Equipment Recommendations  Tub/shower seat       Precautions / Restrictions Precautions Precautions: Shoulder Shoulder Interventions: Shoulder abduction pillow;At all times;Off for dressing/bathing/exercises Precaution Comments: arm must be in abducted postion at all times.        Mobility Bed Mobility               General bed mobility comments: pt in chair  Transfers Overall transfer level: Modified independent                        ADL either performed or assessed with clinical judgement   ADL Overall ADL's : Needs assistance/impaired                                       General ADL Comments: Educated sister regarding donning and doffing sling and shirt as well as positioning in in sitting, bed and when pt showers     Vision Patient Visual Report: No change from baseline            Cognition Arousal/Alertness: Awake/alert Behavior During Therapy: WFL for tasks assessed/performed Overall Cognitive Status: Within Functional Limits for tasks assessed                                          Exercises Shoulder Exercises Digit Composite Flexion: AROM;Right;5 reps Composite Extension: AROM;10 reps;Right   Shoulder Instructions Shoulder Instructions Donning/doffing shirt without moving shoulder: Patient able to independently direct caregiver;Caregiver independent with task Method for sponge bathing under operated UE: Patient able to independently  direct caregiver;Caregiver independent with task Donning/doffing sling/immobilizer: Patient able to independently direct caregiver;Caregiver independent with task Correct positioning of sling/immobilizer: Patient able to independently direct caregiver;Caregiver independent with task Sling wearing schedule (on at all times/off for ADL's): Independent Proper positioning of operated UE when showering: Patient able to independently direct caregiver;Caregiver independent with task Positioning of UE while sleeping: Patient able to independently direct caregiver;Caregiver independent with task     General Comments      Pertinent Vitals/ Pain       Pain Assessment: 0-10 Pain Score: 4  Pain Location: R shoulder Pain Descriptors / Indicators: Guarding;Grimacing;Sore Pain Intervention(s): Monitored during session  Home Living Family/patient expects to be discharged to:: Private residence Living Arrangements: Alone Available Help at Discharge: Family;Available PRN/intermittently Type of Home: House Home Access: Stairs to enter   Entrance Stairs-Rails: None Home Layout: One level         Biochemist, clinical: Standard     Home Equipment: None              Frequency  Min 2X/week        Progress Toward Goals  OT Goals(current goals can now be found in the care plan section)  Acute Rehab OT Goals Patient Stated Goal: be able to fly fish OT Goal Formulation: With patient Time For Goal Achievement: 03/21/17 Potential to Achieve Goals: Good ADL Goals Pt Will Perform Upper Body Bathing: with supervision;with caregiver independent in assisting Pt Will Perform Upper Body Dressing: with supervision;with caregiver independent in assisting Pt/caregiver will Perform Home Exercise Program: Right Upper extremity (fingers and wrist only) Additional ADL Goal #1: Pt will direct caregiver ih A ing with sling, shirt and positioning of RUE  maintaining correct postioin of RUE  Plan  Discharge plan remains appropriate       AM-PAC PT "6 Clicks" Daily Activity     Outcome Measure   Help from another person eating meals?: A Little Help from another person taking care of personal grooming?: A Little Help from another person toileting, which includes using toliet, bedpan, or urinal?: A Little Help from another person bathing (including washing, rinsing, drying)?: A Lot Help from another person to put on and taking off regular upper body clothing?: Total Help from another person to put on and taking off regular lower body clothing?: A Little 6 Click Score: 15    End of Session    OT Visit Diagnosis: Unsteadiness on feet (R26.81)   Activity Tolerance Patient tolerated treatment well   Patient Left in chair   Nurse Communication Mobility status    Functional Assessment Tool Used: Clinical judgement Functional Limitation: Self care Self Care Current Status (P9470): At least 40 percent but less than 60 percent impaired, limited or restricted Self Care Goal Status (R6151): At least 1 percent but less than 20 percent impaired, limited or restricted   Time: 0910-0958 OT Time Calculation (min): 48 min  Charges: OT G-codes **NOT FOR INPATIENT CLASS** Functional Assessment Tool Used: Clinical judgement Functional Limitation: Self care Self Care Current Status (I3437): At least 40 percent but less than 60 percent impaired, limited or restricted Self Care Goal Status (D5789): At least 1 percent but less than 20 percent impaired, limited or restricted OT General Charges $OT Visit: 1 Procedure OT Evaluation $OT Eval Moderate Complexity: 1 Procedure OT Treatments $Self Care/Home Management : 23-37 mins  [place], [place] [phone]   Betsy Pries 03/20/2017, 12:23 PM

## 2017-03-20 NOTE — Evaluation (Signed)
Occupational Therapy Evaluation Patient Details Name: ADARSH MUNDORF MRN: [id number] DOB: November 09, 1955 Today's Date: 03/20/2017    History of Present Illness Mini open rotator cuff repair (Right)   Clinical Impression   Pt admitted for R RTC repair. Pt currently with functional limitations due to the deficits listed below (see OT Problem List). Pt will benefit from skilled OT to increase their safety and independence with ADL and functional mobility for ADL to facilitate discharge to venue listed below.      Follow Up Recommendations  DC plan and follow up therapy as arranged by surgeon    Equipment Recommendations  Tub/shower seat    Recommendations for Other Services       Precautions / Restrictions Precautions Precautions: Shoulder Shoulder Interventions: Shoulder abduction pillow;At all times;Off for dressing/bathing/exercises Precaution Comments: arm must be in abducted postion at all times.       Mobility Bed Mobility               General bed mobility comments: pt in chair  Transfers Overall transfer level: Modified independent                        ADL either performed or assessed with clinical judgement                      Pertinent Vitals/Pain Pain Assessment: 0-10 Pain Score: 5  Pain Location: R shoulder Pain Descriptors / Indicators: Guarding;Grimacing;Sore Pain Intervention(s): Monitored during session;Limited activity within patient's tolerance;RN gave pain meds during session;Repositioned     Hand Dominance     Extremity/Trunk Assessment Upper Extremity Assessment Upper Extremity Assessment: RUE deficits/detail RUE Deficits / Details: s/p RTC repair           Communication Communication Communication: No difficulties   Cognition Arousal/Alertness: Awake/alert Behavior During Therapy: WFL for tasks assessed/performed Overall Cognitive Status: Within Functional Limits for tasks assessed                                      General Comments       Exercises Shoulder Exercises Digit Composite Flexion: AROM;Right;5 reps Composite Extension: AROM;10 reps;Right   Shoulder Instructions Shoulder Instructions Donning/doffing shirt without moving shoulder: Maximal assistance Method for sponge bathing under operated UE: Maximal assistance Donning/doffing sling/immobilizer: Maximal assistance Correct positioning of sling/immobilizer: Minimal assistance Sling wearing schedule (on at all times/off for ADL's): Independent Proper positioning of operated UE when showering: Maximal assistance Positioning of UE while sleeping: Maximal assistance    Home Living Family/patient expects to be discharged to:: Private residence Living Arrangements: Alone Available Help at Discharge: Family;Available PRN/intermittently Type of Home: House Home Access: Stairs to enter   Entrance Stairs-Rails: None Home Layout: One level         Biochemist, clinical: Standard     Home Equipment: None                OT Problem List: Decreased strength;Decreased range of motion;Decreased knowledge of precautions;Decreased safety awareness;Decreased knowledge of use of DME or AE      OT Treatment/Interventions: Self-care/ADL training    OT Goals(Current goals can be found in the care plan section) Acute Rehab OT Goals Patient Stated Goal: be able to fly fish OT Goal Formulation: With patient Time For Goal Achievement: 03/21/17 Potential to Achieve Goals: Good  OT Frequency: Min 2X/week   Barriers to D/C:  Co-evaluation              AM-PAC PT "6 Clicks" Daily Activity     Outcome Measure Help from another person eating meals?: A Little Help from another person taking care of personal grooming?: A Little Help from another person toileting, which includes using toliet, bedpan, or urinal?: A Little Help from another person bathing (including washing, rinsing, drying)?: A Lot Help  from another person to put on and taking off regular upper body clothing?: Total Help from another person to put on and taking off regular lower body clothing?: A Little 6 Click Score: 15   End of Session Nurse Communication: Mobility status  Activity Tolerance: Patient tolerated treatment well Patient left: in chair  OT Visit Diagnosis: Unsteadiness on feet (R26.81)                Time: 4166-0630 OT Time Calculation (min): 48 min Charges:  OT General Charges $OT Visit: 1 Procedure OT Evaluation $OT Eval Moderate Complexity: 1 Procedure OT Treatments $Self Care/Home Management : 23-37 mins G-Codes: OT G-codes **NOT FOR INPATIENT CLASS** Functional Assessment Tool Used: Clinical judgement Functional Limitation: Self care Self Care Current Status (Z6010): At least 40 percent but less than 60 percent impaired, limited or restricted Self Care Goal Status (X3235): At least 1 percent but less than 20 percent impaired, limited or restricted   Kari Baars, [place]  Payton Mccallum D 03/20/2017, 10:25 AM

## 2017-04-03 ENCOUNTER — Encounter (HOSPITAL_COMMUNITY): Payer: Self-pay | Admitting: Specialist

## 2017-04-24 DIAGNOSIS — M25611 Stiffness of right shoulder, not elsewhere classified: Secondary | ICD-10-CM | POA: Diagnosis not present

## 2017-04-24 DIAGNOSIS — M25511 Pain in right shoulder: Secondary | ICD-10-CM | POA: Diagnosis not present

## 2017-04-28 DIAGNOSIS — M25611 Stiffness of right shoulder, not elsewhere classified: Secondary | ICD-10-CM | POA: Diagnosis not present

## 2017-04-28 DIAGNOSIS — M25511 Pain in right shoulder: Secondary | ICD-10-CM | POA: Diagnosis not present

## 2017-05-01 DIAGNOSIS — M25611 Stiffness of right shoulder, not elsewhere classified: Secondary | ICD-10-CM | POA: Diagnosis not present

## 2017-05-01 DIAGNOSIS — M25511 Pain in right shoulder: Secondary | ICD-10-CM | POA: Diagnosis not present

## 2017-05-08 DIAGNOSIS — M25511 Pain in right shoulder: Secondary | ICD-10-CM | POA: Diagnosis not present

## 2017-05-08 DIAGNOSIS — M25611 Stiffness of right shoulder, not elsewhere classified: Secondary | ICD-10-CM | POA: Diagnosis not present

## 2017-05-12 DIAGNOSIS — M25611 Stiffness of right shoulder, not elsewhere classified: Secondary | ICD-10-CM | POA: Diagnosis not present

## 2017-05-12 DIAGNOSIS — M25511 Pain in right shoulder: Secondary | ICD-10-CM | POA: Diagnosis not present

## 2017-05-15 DIAGNOSIS — M25511 Pain in right shoulder: Secondary | ICD-10-CM | POA: Diagnosis not present

## 2017-05-15 DIAGNOSIS — M25611 Stiffness of right shoulder, not elsewhere classified: Secondary | ICD-10-CM | POA: Diagnosis not present

## 2017-05-19 DIAGNOSIS — M25611 Stiffness of right shoulder, not elsewhere classified: Secondary | ICD-10-CM | POA: Diagnosis not present

## 2017-05-19 DIAGNOSIS — M25511 Pain in right shoulder: Secondary | ICD-10-CM | POA: Diagnosis not present

## 2017-05-22 DIAGNOSIS — M25611 Stiffness of right shoulder, not elsewhere classified: Secondary | ICD-10-CM | POA: Diagnosis not present

## 2017-05-22 DIAGNOSIS — M25511 Pain in right shoulder: Secondary | ICD-10-CM | POA: Diagnosis not present

## 2017-05-26 DIAGNOSIS — M25511 Pain in right shoulder: Secondary | ICD-10-CM | POA: Diagnosis not present

## 2017-05-26 DIAGNOSIS — M25611 Stiffness of right shoulder, not elsewhere classified: Secondary | ICD-10-CM | POA: Diagnosis not present

## 2017-05-29 DIAGNOSIS — M25611 Stiffness of right shoulder, not elsewhere classified: Secondary | ICD-10-CM | POA: Diagnosis not present

## 2017-05-29 DIAGNOSIS — M25511 Pain in right shoulder: Secondary | ICD-10-CM | POA: Diagnosis not present

## 2017-06-05 DIAGNOSIS — M25511 Pain in right shoulder: Secondary | ICD-10-CM | POA: Diagnosis not present

## 2017-06-05 DIAGNOSIS — M25611 Stiffness of right shoulder, not elsewhere classified: Secondary | ICD-10-CM | POA: Diagnosis not present

## 2017-06-09 DIAGNOSIS — M25611 Stiffness of right shoulder, not elsewhere classified: Secondary | ICD-10-CM | POA: Diagnosis not present

## 2017-06-09 DIAGNOSIS — M25511 Pain in right shoulder: Secondary | ICD-10-CM | POA: Diagnosis not present

## 2017-06-16 DIAGNOSIS — M25511 Pain in right shoulder: Secondary | ICD-10-CM | POA: Diagnosis not present

## 2017-06-16 DIAGNOSIS — M25611 Stiffness of right shoulder, not elsewhere classified: Secondary | ICD-10-CM | POA: Diagnosis not present

## 2017-06-19 DIAGNOSIS — M25611 Stiffness of right shoulder, not elsewhere classified: Secondary | ICD-10-CM | POA: Diagnosis not present

## 2017-06-19 DIAGNOSIS — M25511 Pain in right shoulder: Secondary | ICD-10-CM | POA: Diagnosis not present

## 2017-06-23 DIAGNOSIS — M25511 Pain in right shoulder: Secondary | ICD-10-CM | POA: Diagnosis not present

## 2017-06-23 DIAGNOSIS — M25611 Stiffness of right shoulder, not elsewhere classified: Secondary | ICD-10-CM | POA: Diagnosis not present

## 2017-06-26 DIAGNOSIS — M25611 Stiffness of right shoulder, not elsewhere classified: Secondary | ICD-10-CM | POA: Diagnosis not present

## 2017-06-26 DIAGNOSIS — M25511 Pain in right shoulder: Secondary | ICD-10-CM | POA: Diagnosis not present

## 2017-06-30 DIAGNOSIS — M25611 Stiffness of right shoulder, not elsewhere classified: Secondary | ICD-10-CM | POA: Diagnosis not present

## 2017-06-30 DIAGNOSIS — M25511 Pain in right shoulder: Secondary | ICD-10-CM | POA: Diagnosis not present

## 2017-07-07 DIAGNOSIS — M25511 Pain in right shoulder: Secondary | ICD-10-CM | POA: Diagnosis not present

## 2017-07-07 DIAGNOSIS — M25611 Stiffness of right shoulder, not elsewhere classified: Secondary | ICD-10-CM | POA: Diagnosis not present

## 2017-07-14 DIAGNOSIS — M25611 Stiffness of right shoulder, not elsewhere classified: Secondary | ICD-10-CM | POA: Diagnosis not present

## 2017-07-14 DIAGNOSIS — M25511 Pain in right shoulder: Secondary | ICD-10-CM | POA: Diagnosis not present

## 2017-07-17 DIAGNOSIS — M25611 Stiffness of right shoulder, not elsewhere classified: Secondary | ICD-10-CM | POA: Diagnosis not present

## 2017-07-17 DIAGNOSIS — M25511 Pain in right shoulder: Secondary | ICD-10-CM | POA: Diagnosis not present

## 2017-07-21 DIAGNOSIS — M25611 Stiffness of right shoulder, not elsewhere classified: Secondary | ICD-10-CM | POA: Diagnosis not present

## 2017-07-21 DIAGNOSIS — M25511 Pain in right shoulder: Secondary | ICD-10-CM | POA: Diagnosis not present

## 2017-07-24 DIAGNOSIS — M25511 Pain in right shoulder: Secondary | ICD-10-CM | POA: Diagnosis not present

## 2017-07-24 DIAGNOSIS — M25611 Stiffness of right shoulder, not elsewhere classified: Secondary | ICD-10-CM | POA: Diagnosis not present

## 2017-07-28 DIAGNOSIS — M25611 Stiffness of right shoulder, not elsewhere classified: Secondary | ICD-10-CM | POA: Diagnosis not present

## 2017-07-28 DIAGNOSIS — M25511 Pain in right shoulder: Secondary | ICD-10-CM | POA: Diagnosis not present

## 2017-07-31 DIAGNOSIS — M25611 Stiffness of right shoulder, not elsewhere classified: Secondary | ICD-10-CM | POA: Diagnosis not present

## 2017-07-31 DIAGNOSIS — M25511 Pain in right shoulder: Secondary | ICD-10-CM | POA: Diagnosis not present

## 2017-08-03 DIAGNOSIS — Z09 Encounter for follow-up examination after completed treatment for conditions other than malignant neoplasm: Secondary | ICD-10-CM | POA: Diagnosis not present

## 2017-08-13 DIAGNOSIS — M25611 Stiffness of right shoulder, not elsewhere classified: Secondary | ICD-10-CM | POA: Diagnosis not present

## 2017-08-13 DIAGNOSIS — M25511 Pain in right shoulder: Secondary | ICD-10-CM | POA: Diagnosis not present

## 2017-08-18 DIAGNOSIS — M25511 Pain in right shoulder: Secondary | ICD-10-CM | POA: Diagnosis not present

## 2017-08-18 DIAGNOSIS — M25611 Stiffness of right shoulder, not elsewhere classified: Secondary | ICD-10-CM | POA: Diagnosis not present

## 2017-08-19 ENCOUNTER — Ambulatory Visit
Admission: RE | Admit: 2017-08-19 | Discharge: 2017-08-19 | Disposition: A | Discharge status: Home / Self Care | Payer: 59 | Source: Ambulatory Visit | Attending: Family Medicine | Admitting: Family Medicine

## 2017-08-19 ENCOUNTER — Other Ambulatory Visit: Payer: Self-pay | Admitting: Family Medicine

## 2017-08-19 DIAGNOSIS — J449 Chronic obstructive pulmonary disease, unspecified: Secondary | ICD-10-CM

## 2017-08-19 DIAGNOSIS — I10 Essential (primary) hypertension: Secondary | ICD-10-CM | POA: Diagnosis not present

## 2017-08-19 DIAGNOSIS — B079 Viral wart, unspecified: Secondary | ICD-10-CM | POA: Diagnosis not present

## 2017-08-19 DIAGNOSIS — Z1159 Encounter for screening for other viral diseases: Secondary | ICD-10-CM | POA: Diagnosis not present

## 2017-08-19 DIAGNOSIS — R05 Cough: Secondary | ICD-10-CM | POA: Diagnosis not present

## 2017-08-19 DIAGNOSIS — Z0000 Encounter for general adult medical examination without abnormal findings: Secondary | ICD-10-CM | POA: Diagnosis not present

## 2017-08-27 ENCOUNTER — Ambulatory Visit
Admission: RE | Admit: 2017-08-27 | Discharge: 2017-08-27 | Disposition: A | Discharge status: Home / Self Care | Payer: 59 | Source: Ambulatory Visit | Attending: Family Medicine | Admitting: Family Medicine

## 2017-08-27 ENCOUNTER — Other Ambulatory Visit: Payer: Self-pay | Admitting: Family Medicine

## 2017-08-27 DIAGNOSIS — R918 Other nonspecific abnormal finding of lung field: Secondary | ICD-10-CM | POA: Diagnosis not present

## 2017-08-27 DIAGNOSIS — R9389 Abnormal findings on diagnostic imaging of other specified body structures: Secondary | ICD-10-CM

## 2017-09-09 DIAGNOSIS — Z4789 Encounter for other orthopedic aftercare: Secondary | ICD-10-CM | POA: Diagnosis not present

## 2017-09-29 HISTORY — PX: SHOULDER SURGERY: SHX246

## 2017-12-07 ENCOUNTER — Encounter (HOSPITAL_COMMUNITY): Payer: 59

## 2017-12-07 ENCOUNTER — Ambulatory Visit: Payer: 59 | Admitting: Family

## 2018-01-18 ENCOUNTER — Ambulatory Visit (INDEPENDENT_AMBULATORY_CARE_PROVIDER_SITE_OTHER)
Admission: RE | Admit: 2018-01-18 | Discharge: 2018-01-18 | Disposition: A | Discharge status: Home / Self Care | Payer: 59 | Source: Ambulatory Visit | Attending: Surgery | Admitting: Surgery

## 2018-01-18 ENCOUNTER — Encounter: Payer: Self-pay | Admitting: Family

## 2018-01-18 ENCOUNTER — Other Ambulatory Visit: Payer: Self-pay

## 2018-01-18 ENCOUNTER — Ambulatory Visit (HOSPITAL_COMMUNITY)
Admission: RE | Admit: 2018-01-18 | Discharge: 2018-01-18 | Disposition: A | Discharge status: Home / Self Care | Payer: 59 | Source: Ambulatory Visit | Attending: Surgery | Admitting: Surgery

## 2018-01-18 ENCOUNTER — Ambulatory Visit (INDEPENDENT_AMBULATORY_CARE_PROVIDER_SITE_OTHER): Payer: 59 | Admitting: Family

## 2018-01-18 VITALS — BP 142/96 | HR 87 | Temp 97.9°F | Resp 20 | Ht 72.0 in | Wt 206.0 lb

## 2018-01-18 DIAGNOSIS — F17200 Nicotine dependence, unspecified, uncomplicated: Secondary | ICD-10-CM | POA: Diagnosis not present

## 2018-01-18 DIAGNOSIS — Z9862 Peripheral vascular angioplasty status: Secondary | ICD-10-CM

## 2018-01-18 DIAGNOSIS — R9439 Abnormal result of other cardiovascular function study: Secondary | ICD-10-CM | POA: Diagnosis not present

## 2018-01-18 DIAGNOSIS — I70212 Atherosclerosis of native arteries of extremities with intermittent claudication, left leg: Secondary | ICD-10-CM

## 2018-01-18 NOTE — Progress Notes (Signed)
Mark Stevenson   CC: Follow up peripheral artery occlusive disease  History of Present Illness Mark Stevenson is a 62 y.o. male who was originally referred to Dr. Trula Slade for left hip claudication 04/14/2017.  He states that he had left hip claudication after ambulating approximately 75-100 yards. His pain is better with rest.  He did a trial of cilostazol but did not tolerate it.  He is s/p angiogram with atherectomy of left superficial femoral artery, drug coated balloon angioplasty of left superficial femoral artery on 04/22/2016  by Dr. Trula Slade.    Since then on 10/23/2016 Dr. Maxie Better performed revision microlumbar decompression L3-4 bilaterally, microlumbar decompression bilaterally at L4-5, and Gill laminectomy of L4.  He has had rotator cuff surgery. He completed physical therapy.  He states he has become sedentary due to many surgeries.   He does not seem to have claudication sx's with walking, states right hip weakness is after surgery and being sedentary.    He states his breathing feels no worse than usual.     Diabetic: No Tobacco use: smoker  (1.5 ppd, started about age 22 yrs)  Pt meds include: Statin :Yes Betablocker: No ASA: No Other anticoagulants/antiplatelets: no  Past Medical History:  Diagnosis Date  . Arthritis   . Cancer (HCC)    skin cancer bil knees  . COPD (chronic obstructive pulmonary disease) ([place])   . Dyspnea    increased exertion; pt states can walk a flight of stairs w/o having to stop to catch breath   . GERD (gastroesophageal reflux disease)    occ  . Hypertension   . Nicotine dependence, cigarettes, uncomplicated   . Peripheral neuropathy   . Pure hypercholesterolemia   . Sebaceous cyst    scrotal area   . Viral wart     Social History Social History   Tobacco Use  . Smoking status: Current Every Day Smoker    Packs/day: 1.50    Years: 42.00    Pack years: 63.00    Types: Cigarettes  . Smokeless  tobacco: Never Used  Substance Use Topics  . Alcohol use: Yes    Alcohol/week: 6.0 oz    Types: 10 Shots of liquor per week    Comment: 3 days per week vodka   . Drug use: No    Family History Family History  Problem Relation Age of Onset  . Colon cancer Son   . Liver cancer Son   . Lung cancer Father   . Pancreatic cancer Neg Hx   . Stomach cancer Neg Hx     Past Surgical History:  Procedure Laterality Date  . ankles     tarsal tunnel surgery bilat  . BACK SURGERY  2008 and 10/22/16   herniated disc  . broken knee cap  2002  . crushed L-4 vertebrae  1980  . KNEE ARTHROSCOPY     x3 bilat/ 2 times left knee  . LEG SKIN LESION  BIOPSY / EXCISION     Bil knees  . LUMBAR LAMINECTOMY/DECOMPRESSION MICRODISCECTOMY N/A 10/22/2016   Procedure: MICRO LUMBAR DECOMPRESSION L3-L4, L4-L5 REVISION  2 LEVELS;  Surgeon: Susa Day, MD;  Location: [facility];  Service: Orthopedics;  Laterality: N/A;  Requests 150 mins  . PERIPHERAL Mark CATHETERIZATION N/A 04/22/2016   Procedure: Abdominal Aortogram w/ bilateral Lower Extremity Runoff;  Surgeon: Serafina Mitchell, MD;  Location: [facility];  Service: Cardiovascular;  Laterality: N/A;  . PERIPHERAL Mark CATHETERIZATION Left 04/22/2016  Procedure: Peripheral Mark Atherectomy;  Surgeon: Serafina Mitchell, MD;  Location: [place] [facility];  Service: Cardiovascular;  Laterality: Left;  . ruptured disc  2008  . SHOULDER OPEN ROTATOR CUFF REPAIR Right 03/19/2017   Procedure: Mini open rotator cuff repair;  Surgeon: Susa Day, MD;  Location: [facility];  Service: Orthopedics;  Laterality: Right;  with block  . tendons in calves lengthened     plantar fascitis  . TOTAL HIP ARTHROPLASTY  2012   left    No Known Allergies  Current Outpatient Medications  Medication Sig Dispense Refill  . ADVAIR DISKUS 250-50 MCG/DOSE AEPB Inhale 1 puff into the lungs 2 (two) times daily.     . Cholecalciferol (VITAMIN D3) 2000 UNITS TABS Take  2,000 Units by mouth daily.     Marland Kitchen gabapentin (NEURONTIN) 600 MG tablet Take 600 mg by mouth at bedtime.     Marland Kitchen losartan (COZAAR) 100 MG tablet Take 100 mg by mouth daily.    Marland Kitchen lovastatin (MEVACOR) 20 MG tablet Take 20 mg by mouth daily.     Marland Kitchen PROAIR HFA 108 (90 Base) MCG/ACT inhaler Inhale 2 puffs into the lungs every 6 (six) hours as needed for shortness of breath or wheezing.    . triamcinolone cream (KENALOG) 0.1 % Apply 1 application topically daily as needed for rash.    . triamterene-hydrochlorothiazide (MAXZIDE-25) 37.5-25 MG per tablet Take 1 tablet by mouth daily.     No current facility-administered medications for this visit.     ROS: See HPI for pertinent positives and negatives.   Physical Examination  Vitals:   01/18/18 1051 01/18/18 1053  BP: (!) 155/91 (!) 142/96  Pulse: 87   Resp: 20   Temp: 97.9 F (36.6 C)   TempSrc: Oral   SpO2: 97%   Weight: 206 lb (93.4 kg)   Height: 6' (1.829 m)    Body mass index is 27.94 kg/m.  General: A&O x 3, WDWN, male. Gait: normal HENT: No gross abnormalities.  Eyes: PERRLA. Pulmonary: Respirations are non labored, limited air movement in all fields, + wheezes in all fields, no rales or rhonchi. Cardiac: regular rhythm, no detected murmur.         Carotid Bruits Right Left   Negative Negative   Radial pulses are 2+ palpable bilaterally   Adominal aortic pulse is not palpable                         Mark EXAM: Extremities without ischemic changes, without Gangrene; without open wounds.                                                                                                          LE Pulses Right Left       FEMORAL   palpable  2+ palpable        POPLITEAL  not palpable   not palpable       POSTERIOR TIBIAL  not palpable   not palpable  DORSALIS PEDIS      ANTERIOR TIBIAL not palpable  1+ palpable    Abdomen: soft, NT, no palpable masses. Skin: no rashes, no cellulitis, no ulcers  noted. Musculoskeletal: no muscle wasting or atrophy.  Neurologic: A&O X 3; appropriate affect, Sensation is normal; MOTOR FUNCTION:  moving all extremities equally, motor strength 5/5 throughout. Speech is fluent/normal. CN 2-12 intact. Psychiatric: Thought content is normal, mood appropriate for clinical situation.     ASSESSMENT: ROLIN SCHULT is a 62 y.o. male who is s/p atherectomy of left superficial femoral artery, drug coated balloon angioplasty of left superficial femoral artery on 04/22/2016. He does not seem to have claudication sx's with walking, states right hip weakness is after surgery and being sedentary.   There are no signs of ischemia in his feet or legs.   His primary atherosclerotic risk factors remains smoking since age 79, currently 1.5 ppd. He does not have DM He does not take an antiplatelet agent. He does take a daily statin.   DATA  Left LE Arterial Duplex (01/18/18): No significant stenosis from the left mid CFA to the left distal PTA.  Mostly triphasic waveforms; biphasic at DFA, monophasic at ATA distal.  No significant change compared to previous study.    ABI (Date: 01/18/2018):  R:   ABI: 0.98 (was 1.01 on 12-01-16),   PT: tri  DP: bi  TBI:  0.41 (was 0.55)  L:   ABI: 1.11 (was 0.99),   PT: tri  DP: tri  TBI: 0.52 (was 0.73)  Stable and normal bilateral ABI with tri and biphasic waveforms.  Decline in bilateral TBI.    PLAN:  Based on the patient's Mark studies and examination, pt will return to clinic in 6 months with ABI's and left LE arterial duplex.   The patient was counseled re smoking cessation and given several free resources re smoking cessation.  I discussed in depth with the patient the nature of atherosclerosis, and emphasized the importance of maximal medical management including strict control of blood pressure, blood glucose, and lipid levels, obtaining regular exercise, and cessation of smoking.  The  patient is aware that without maximal medical management the underlying atherosclerotic disease process will progress, limiting the benefit of any interventions.  The patient was given information about PAD including signs, symptoms, treatment, what symptoms should prompt the patient to seek immediate medical care, and risk reduction measures to take.  Clemon Chambers, RN, MSN, FNP-C [facility] Phone: [phone]  Clinic MD: Early on call  01/18/18 11:00 AM

## 2018-01-18 NOTE — Patient Instructions (Signed)
Steps to Quit Smoking Smoking tobacco can be bad for your health. It can also affect almost every organ in your body. Smoking puts you and people around you at risk for many serious long-lasting (chronic) diseases. Quitting smoking is hard, but it is one of the best things that you can do for your health. It is never too late to quit. What are the benefits of quitting smoking? When you quit smoking, you lower your risk for getting serious diseases and conditions. They can include:  Lung cancer or lung disease.  Heart disease.  Stroke.  Heart attack.  Not being able to have children (infertility).  Weak bones (osteoporosis) and broken bones (fractures).  If you have coughing, wheezing, and shortness of breath, those symptoms may get better when you quit. You may also get sick less often. If you are pregnant, quitting smoking can help to lower your chances of having a baby of low birth weight. What can I do to help me quit smoking? Talk with your doctor about what can help you quit smoking. Some things you can do (strategies) include:  Quitting smoking totally, instead of slowly cutting back how much you smoke over a period of time.  Going to in-person counseling. You are more likely to quit if you go to many counseling sessions.  Using resources and support systems, such as: ? Online chats with a counselor. ? Phone quitlines. ? Printed self-help materials. ? Support groups or group counseling. ? Text messaging programs. ? Mobile phone apps or applications.  Taking medicines. Some of these medicines may have nicotine in them. If you are pregnant or breastfeeding, do not take any medicines to quit smoking unless your doctor says it is okay. Talk with your doctor about counseling or other things that can help you.  Talk with your doctor about using more than one strategy at the same time, such as taking medicines while you are also going to in-person counseling. This can help make  quitting easier. What things can I do to make it easier to quit? Quitting smoking might feel very hard at first, but there is a lot that you can do to make it easier. Take these steps:  Talk to your family and friends. Ask them to support and encourage you.  Call phone quitlines, reach out to support groups, or work with a counselor.  Ask people who smoke to not smoke around you.  Avoid places that make you want (trigger) to smoke, such as: ? Bars. ? Parties. ? Smoke-break areas at work.  Spend time with people who do not smoke.  Lower the stress in your life. Stress can make you want to smoke. Try these things to help your stress: ? Getting regular exercise. ? Deep-breathing exercises. ? Yoga. ? Meditating. ? Doing a body scan. To do this, close your eyes, focus on one area of your body at a time from head to toe, and notice which parts of your body are tense. Try to relax the muscles in those areas.  Download or buy apps on your mobile phone or tablet that can help you stick to your quit plan. There are many free apps, such as QuitGuide from the CDC (Centers for Disease Control and Prevention). You can find more support from smokefree.gov and other websites.  This information is not intended to replace advice given to you by your health care provider. Make sure you discuss any questions you have with your health care provider. Document Released: 07/12/2009 Document   Revised: 05/13/2016 Document Reviewed: 01/30/2015 Elsevier Interactive Patient Education  2018 Elsevier Inc.     Peripheral Vascular Disease Peripheral vascular disease (PVD) is a disease of the blood vessels that are not part of your heart and brain. A simple term for PVD is poor circulation. In most cases, PVD narrows the blood vessels that carry blood from your heart to the rest of your body. This can result in a decreased supply of blood to your arms, legs, and internal organs, like your stomach or kidneys.  However, it most often affects a person's lower legs and feet. There are two types of PVD.  Organic PVD. This is the more common type. It is caused by damage to the structure of blood vessels.  Functional PVD. This is caused by conditions that make blood vessels contract and tighten (spasm).  Without treatment, PVD tends to get worse over time. PVD can also lead to acute ischemic limb. This is when an arm or limb suddenly has trouble getting enough blood. This is a medical emergency. Follow these instructions at home:  Take medicines only as told by your doctor.  Do not use any tobacco products, including cigarettes, chewing tobacco, or electronic cigarettes. If you need help quitting, ask your doctor.  Lose weight if you are overweight, and maintain a healthy weight as told by your doctor.  Eat a diet that is low in fat and cholesterol. If you need help, ask your doctor.  Exercise regularly. Ask your doctor for some good activities for you.  Take good care of your feet. ? Wear comfortable shoes that fit well. ? Check your feet often for any cuts or sores. Contact a doctor if:  You have cramps in your legs while walking.  You have leg pain when you are at rest.  You have coldness in a leg or foot.  Your skin changes.  You are unable to get or have an erection (erectile dysfunction).  You have cuts or sores on your feet that are not healing. Get help right away if:  Your arm or leg turns cold and blue.  Your arms or legs become red, warm, swollen, painful, or numb.  You have chest pain or trouble breathing.  You suddenly have weakness in your face, arm, or leg.  You become very confused or you cannot speak.  You suddenly have a very bad headache.  You suddenly cannot see. This information is not intended to replace advice given to you by your health care provider. Make sure you discuss any questions you have with your health care provider. Document Released:  12/10/2009 Document Revised: 02/21/2016 Document Reviewed: 02/23/2014 Elsevier Interactive Patient Education  2017 Elsevier Inc.  

## 2018-03-02 DIAGNOSIS — E7800 Pure hypercholesterolemia, unspecified: Secondary | ICD-10-CM | POA: Diagnosis not present

## 2018-03-02 DIAGNOSIS — I10 Essential (primary) hypertension: Secondary | ICD-10-CM | POA: Diagnosis not present

## 2018-03-02 DIAGNOSIS — J449 Chronic obstructive pulmonary disease, unspecified: Secondary | ICD-10-CM | POA: Diagnosis not present

## 2018-11-01 DIAGNOSIS — I10 Essential (primary) hypertension: Secondary | ICD-10-CM | POA: Diagnosis not present

## 2018-11-01 DIAGNOSIS — Z0000 Encounter for general adult medical examination without abnormal findings: Secondary | ICD-10-CM | POA: Diagnosis not present

## 2019-01-10 DIAGNOSIS — L84 Corns and callosities: Secondary | ICD-10-CM | POA: Diagnosis not present

## 2019-01-10 DIAGNOSIS — D485 Neoplasm of uncertain behavior of skin: Secondary | ICD-10-CM | POA: Diagnosis not present

## 2019-01-10 DIAGNOSIS — W57XXXA Bitten or stung by nonvenomous insect and other nonvenomous arthropods, initial encounter: Secondary | ICD-10-CM | POA: Diagnosis not present

## 2019-10-14 ENCOUNTER — Ambulatory Visit (INDEPENDENT_AMBULATORY_CARE_PROVIDER_SITE_OTHER): Payer: BLUE CROSS/BLUE SHIELD

## 2019-10-14 ENCOUNTER — Other Ambulatory Visit: Payer: Self-pay

## 2019-10-14 ENCOUNTER — Ambulatory Visit
Admission: EM | Admit: 2019-10-14 | Discharge: 2019-10-14 | Disposition: A | Discharge status: Home / Self Care | Payer: BLUE CROSS/BLUE SHIELD | Attending: Family Medicine | Admitting: Family Medicine

## 2019-10-14 DIAGNOSIS — M545 Low back pain: Secondary | ICD-10-CM

## 2019-10-14 DIAGNOSIS — S39012A Strain of muscle, fascia and tendon of lower back, initial encounter: Secondary | ICD-10-CM

## 2019-10-14 DIAGNOSIS — W19XXXA Unspecified fall, initial encounter: Secondary | ICD-10-CM

## 2019-10-14 MED ORDER — CYCLOBENZAPRINE HCL 10 MG PO TABS: 10.0000 mg | ORAL_TABLET | Freq: Three times a day (TID) | ORAL | 0 refills | Status: DC | PRN

## 2019-10-14 MED ORDER — HYDROCODONE-ACETAMINOPHEN 5-325 MG PO TABS: ORAL_TABLET | ORAL | 0 refills | Status: DC

## 2019-10-14 MED ORDER — ACETAMINOPHEN 500 MG PO TABS
1000.0000 mg | ORAL_TABLET | Freq: Once | ORAL | Status: AC
  Administered 2019-10-14: 10:00:00 1000 mg via ORAL

## 2019-10-14 NOTE — ED Triage Notes (Signed)
Pt presents with c/o fall onto hardwood floors, straight on his buttocks. This incident occurred last Friday. Pt reports 2 previous back surgeries. Pt states he has pain in his mid-back, decreased ROM and pain with movement. He denies any radiation of pain or any problems voiding. He reports he has "doubled-up" on his Gabapentin but this has not helped. He has also tried some methocarbamol he had from a previous surgery and this has not helped either. Pt reports he is in extreme pain.

## 2019-10-19 NOTE — ED Provider Notes (Signed)
[facility]    CSN: [id number] Arrival date & time: 10/14/19  [id number]      History   Chief Complaint No chief complaint on file.   HPI Mark Stevenson is a 64 y.o. male.   64 yo male with a c/o low back pain since falling at home on hardwood floor about 1 week ago. States he's had chronic problems with his back in the past and has had surgeries in the past. Denies any numbness/tingling, bowel or bladder problems.      Past Medical History:  Diagnosis Date  . Arthritis   . Cancer (HCC)    skin cancer bil knees  . COPD (chronic obstructive pulmonary disease) ([place])   . Dyspnea    increased exertion; pt states can walk a flight of stairs w/o having to stop to catch breath   . GERD (gastroesophageal reflux disease)    occ  . Hypertension   . Nicotine dependence, cigarettes, uncomplicated   . Peripheral neuropathy   . Pure hypercholesterolemia   . Sebaceous cyst    scrotal area   . Viral wart     Patient Active Problem List   Diagnosis Date Noted  . Complete rotator cuff tear 03/19/2017  . Spinal stenosis of lumbar region 10/22/2016    Past Surgical History:  Procedure Laterality Date  . ankles     tarsal tunnel surgery bilat  . BACK SURGERY  2008 and 10/22/16   herniated disc  . broken knee cap  2002  . crushed L-4 vertebrae  1980  . KNEE ARTHROSCOPY     x3 bilat/ 2 times left knee  . LEG SKIN LESION  BIOPSY / EXCISION     Bil knees  . LUMBAR LAMINECTOMY/DECOMPRESSION MICRODISCECTOMY N/A 10/22/2016   Procedure: MICRO LUMBAR DECOMPRESSION L3-L4, L4-L5 REVISION  2 LEVELS;  Surgeon: Susa Day, MD;  Location: [facility];  Service: Orthopedics;  Laterality: N/A;  Requests 150 mins  . PERIPHERAL VASCULAR CATHETERIZATION N/A 04/22/2016   Procedure: Abdominal Aortogram w/ bilateral Lower Extremity Runoff;  Surgeon: Serafina Mitchell, MD;  Location: [facility];  Service: Cardiovascular;  Laterality: N/A;  . PERIPHERAL VASCULAR CATHETERIZATION Left  04/22/2016   Procedure: Peripheral Vascular Atherectomy;  Surgeon: Serafina Mitchell, MD;  Location: [facility];  Service: Cardiovascular;  Laterality: Left;  . ruptured disc  2008  . SHOULDER OPEN ROTATOR CUFF REPAIR Right 03/19/2017   Procedure: Mini open rotator cuff repair;  Surgeon: Susa Day, MD;  Location: [facility];  Service: Orthopedics;  Laterality: Right;  with block  . tendons in calves lengthened     plantar fascitis  . TOTAL HIP ARTHROPLASTY  2012   left       Home Medications    Prior to Admission medications   Medication Sig Start Date End Date Taking? Authorizing Provider  ADVAIR DISKUS 250-50 MCG/DOSE AEPB Inhale 1 puff into the lungs 2 (two) times daily.  03/21/16  Yes [provider]  Cholecalciferol (VITAMIN D3) 2000 UNITS TABS Take 2,000 Units by mouth daily.    Yes [provider]  gabapentin (NEURONTIN) 600 MG tablet Take 600 mg by mouth at bedtime.    Yes [provider]  losartan (COZAAR) 100 MG tablet Take 100 mg by mouth daily.   Yes [provider]  lovastatin (MEVACOR) 20 MG tablet Take 20 mg by mouth daily.    Yes [provider]  PROAIR HFA 108 [phone] Base) MCG/ACT inhaler Inhale 2  puffs into the lungs every 6 (six) hours as needed for shortness of breath or wheezing. 02/11/17  Yes [provider]  triamcinolone cream (KENALOG) 0.1 % Apply 1 application topically daily as needed for rash. 08/19/16  Yes [provider]  triamterene-hydrochlorothiazide (MAXZIDE-25) 37.5-25 MG per tablet Take 1 tablet by mouth daily.   Yes [provider]  cyclobenzaprine (FLEXERIL) 10 MG tablet Take 1 tablet (10 mg total) by mouth 3 (three) times daily as needed for muscle spasms. 10/14/19   Norval Gable, MD  HYDROcodone-acetaminophen (NORCO/VICODIN) 5-325 MG tablet 1-2 tabs po q 8 hours prn 10/14/19   Norval Gable, MD    Family History Family History  Problem Relation Age of Onset  . Colon cancer  Son   . Liver cancer Son   . Lung cancer Father   . Pancreatic cancer Neg Hx   . Stomach cancer Neg Hx     Social History Social History   Tobacco Use  . Smoking status: Current Every Day Smoker    Packs/day: 1.50    Years: 42.00    Pack years: 63.00    Types: Cigarettes  . Smokeless tobacco: Never Used  Substance Use Topics  . Alcohol use: Yes    Alcohol/week: 10.0 standard drinks    Types: 10 Shots of liquor per week    Comment: 3 days per week vodka   . Drug use: No     Allergies   Patient has no known allergies.   Review of Systems Review of Systems   Physical Exam Triage Vital Signs ED Triage Vitals  Enc Vitals Group     BP 10/14/19 0932 (!) 145/91     Pulse Rate 10/14/19 0932 (!) 102     Resp --      Temp 10/14/19 0932 98 F (36.7 C)     Temp Source 10/14/19 0932 Oral     SpO2 10/14/19 0932 98 %     Weight 10/14/19 0929 205 lb (93 kg)     Height 10/14/19 0929 5' 11.5" (1.816 m)     Head Circumference --      Peak Flow --      Pain Score 10/14/19 0929 10     Pain Loc --      Pain Edu? --      Excl. in [place]? --    No data found.  Updated Vital Signs BP (!) 145/91 (BP Location: Left Arm)   Pulse (!) 102   Temp 98 F (36.7 C) (Oral)   Ht 5' 11.5" (1.816 m)   Wt 93 kg   SpO2 98%   BMI 28.19 kg/m   Visual Acuity Right Eye Distance:   Left Eye Distance:   Bilateral Distance:    Right Eye Near:   Left Eye Near:    Bilateral Near:     Physical Exam Vitals and nursing note reviewed.  Constitutional:      General: He is not in acute distress.    Appearance: He is not toxic-appearing or diaphoretic.  Cardiovascular:     Rate and Rhythm: Normal rate.  Pulmonary:     Effort: Pulmonary effort is normal.  Musculoskeletal:        General: Tenderness present.     Lumbar back: Spasms, tenderness and bony tenderness present. No swelling, edema, deformity or lacerations.  Neurological:     Mental Status: He is alert.      UC Treatments /  Results  Labs (all labs ordered are  listed, but only abnormal results are displayed) Labs Reviewed - No data to display  EKG   Radiology No results found.  Procedures Procedures (including critical care time)  Medications Ordered in UC Medications  acetaminophen (TYLENOL) tablet 1,000 mg (1,000 mg Oral Given 10/14/19 1009)    Initial Impression / Assessment and Plan / UC Course  I have reviewed the triage vital signs and the nursing notes.  Pertinent labs & imaging results that were available during my care of the patient were reviewed by me and considered in my medical decision making (see chart for details).      Final Clinical Impressions(s) / UC Diagnoses   Final diagnoses:  Strain of lumbar region, initial encounter    ED Prescriptions    Medication Sig Dispense Auth. Provider   cyclobenzaprine (FLEXERIL) 10 MG tablet Take 1 tablet (10 mg total) by mouth 3 (three) times daily as needed for muscle spasms. 30 tablet Norval Gable, MD   HYDROcodone-acetaminophen (NORCO/VICODIN) 5-325 MG tablet 1-2 tabs po q 8 hours prn 8 tablet Nassim Cosma, MD      1. x-ray results and diagnosis reviewed with patient 2. rx as per orders above; reviewed possible side effects, interactions, risks and benefits  3. Recommend supportive treatment with heat to area and easy stretching   4. Follow-up prn if symptoms worsen or don't improve  I have reviewed the PDMP during this encounter.   Norval Gable, MD 10/19/19 1434

## 2020-05-11 ENCOUNTER — Encounter: Payer: Self-pay | Admitting: Gastroenterology

## 2020-07-04 ENCOUNTER — Ambulatory Visit (AMBULATORY_SURGERY_CENTER): Payer: Self-pay | Admitting: *Deleted

## 2020-07-04 ENCOUNTER — Other Ambulatory Visit: Payer: Self-pay

## 2020-07-04 VITALS — Ht 71.5 in | Wt 179.0 lb

## 2020-07-04 DIAGNOSIS — Z86010 Personal history of colonic polyps: Secondary | ICD-10-CM

## 2020-07-04 MED ORDER — PLENVU 140 G PO SOLR: 1.0000 | Freq: Once | ORAL | 0 refills | Status: AC

## 2020-07-04 NOTE — Progress Notes (Signed)

## 2020-07-05 ENCOUNTER — Encounter: Payer: Self-pay | Admitting: Gastroenterology

## 2020-07-05 ENCOUNTER — Telehealth: Payer: Self-pay | Admitting: Gastroenterology

## 2020-07-05 NOTE — Telephone Encounter (Signed)
[employer] medicare coupon activated and called into pt's pharmacy-pt is aware this will cost him about $60 pt agrees to pay this.

## 2020-07-05 NOTE — Telephone Encounter (Signed)
Patient called states the prep medication requires prior auth please call to advise

## 2020-07-18 ENCOUNTER — Encounter: Payer: BLUE CROSS/BLUE SHIELD | Admitting: Gastroenterology

## 2020-07-19 ENCOUNTER — Other Ambulatory Visit: Payer: Self-pay

## 2020-07-19 ENCOUNTER — Other Ambulatory Visit
Admission: RE | Admit: 2020-07-19 | Discharge: 2020-07-19 | Disposition: A | Discharge status: Home / Self Care | Payer: Medicare Other | Source: Ambulatory Visit | Attending: Gastroenterology | Admitting: Gastroenterology

## 2020-07-19 DIAGNOSIS — Z20822 Contact with and (suspected) exposure to covid-19: Secondary | ICD-10-CM | POA: Insufficient documentation

## 2020-07-19 DIAGNOSIS — Z01818 Encounter for other preprocedural examination: Secondary | ICD-10-CM | POA: Diagnosis present

## 2020-07-20 LAB — SARS CORONAVIRUS 2 (TAT 6-24 HRS): SARS Coronavirus 2: NEGATIVE

## 2020-07-23 ENCOUNTER — Ambulatory Visit (AMBULATORY_SURGERY_CENTER): Payer: Medicare Other | Admitting: Gastroenterology

## 2020-07-23 ENCOUNTER — Other Ambulatory Visit: Payer: Self-pay

## 2020-07-23 ENCOUNTER — Encounter: Payer: Self-pay | Admitting: Gastroenterology

## 2020-07-23 VITALS — BP 133/79 | HR 83 | Temp 97.8°F | Resp 19 | Ht 71.5 in | Wt 179.2 lb

## 2020-07-23 DIAGNOSIS — D127 Benign neoplasm of rectosigmoid junction: Secondary | ICD-10-CM | POA: Diagnosis not present

## 2020-07-23 DIAGNOSIS — D120 Benign neoplasm of cecum: Secondary | ICD-10-CM

## 2020-07-23 DIAGNOSIS — K621 Rectal polyp: Secondary | ICD-10-CM

## 2020-07-23 DIAGNOSIS — D125 Benign neoplasm of sigmoid colon: Secondary | ICD-10-CM

## 2020-07-23 DIAGNOSIS — Z800 Family history of malignant neoplasm of digestive organs: Secondary | ICD-10-CM | POA: Diagnosis not present

## 2020-07-23 DIAGNOSIS — Z86010 Personal history of colonic polyps: Secondary | ICD-10-CM

## 2020-07-23 DIAGNOSIS — D128 Benign neoplasm of rectum: Secondary | ICD-10-CM

## 2020-07-23 MED ORDER — SODIUM CHLORIDE 0.9 % IV SOLN: 500.0000 mL | Freq: Once | INTRAVENOUS | Status: DC

## 2020-07-23 NOTE — Progress Notes (Signed)
Report to PACU, RN, vss, BBS= Clear.  

## 2020-07-23 NOTE — Patient Instructions (Signed)
Handouts given for polyps, diverticulosis and hemorrhoids.  Await pathology results.  No aspirin, ibuprofen, naproxen or other NSAIDS for 2 weeks.  You may take tylenol if needed.  YOU HAD AN ENDOSCOPIC PROCEDURE TODAY AT [facility]:   Refer to the procedure report that was given to you for any specific questions about what was found during the examination.  If the procedure report does not answer your questions, please call your gastroenterologist to clarify.  If you requested that your care partner not be given the details of your procedure findings, then the procedure report has been included in a sealed envelope for you to review at your convenience later.  YOU SHOULD EXPECT: Some feelings of bloating in the abdomen. Passage of more gas than usual.  Walking can help get rid of the air that was put into your GI tract during the procedure and reduce the bloating. If you had a lower endoscopy (such as a colonoscopy or flexible sigmoidoscopy) you may notice spotting of blood in your stool or on the toilet paper. If you underwent a bowel prep for your procedure, you may not have a normal bowel movement for a few days.  Please Note:  You might notice some irritation and congestion in your nose or some drainage.  This is from the oxygen used during your procedure.  There is no need for concern and it should clear up in a day or so.  SYMPTOMS TO REPORT IMMEDIATELY:   Following lower endoscopy (colonoscopy or flexible sigmoidoscopy):  Excessive amounts of blood in the stool  Significant tenderness or worsening of abdominal pains  Swelling of the abdomen that is new, acute  Fever of 100F or higher  For urgent or emergent issues, a gastroenterologist can be reached at any hour by calling [phone]. Do not use MyChart messaging for urgent concerns.    DIET:  We do recommend a small meal at first, but then you may proceed to your regular diet.  Drink plenty of fluids but you  should avoid alcoholic beverages for 24 hours.  ACTIVITY:  You should plan to take it easy for the rest of today and you should NOT DRIVE or use heavy machinery until tomorrow (because of the sedation medicines used during the test).    FOLLOW UP: Our staff will call the number listed on your records 48-72 hours following your procedure to check on you and address any questions or concerns that you may have regarding the information given to you following your procedure. If we do not reach you, we will leave a message.  We will attempt to reach you two times.  During this call, we will ask if you have developed any symptoms of COVID 19. If you develop any symptoms (ie: fever, flu-like symptoms, shortness of breath, cough etc.) before then, please call [phone].  If you test positive for Covid 19 in the 2 weeks post procedure, please call and report this information to [place].    If any biopsies were taken you will be contacted by phone or by letter within the next 1-3 weeks.  Please call us at [phone] if you have not heard about the biopsies in 3 weeks.    SIGNATURES/CONFIDENTIALITY: You and/or your care partner have signed paperwork which will be entered into your electronic medical record.  These signatures attest to the fact that that the information above on your After Visit Summary has been reviewed and is understood.  Full responsibility of the  confidentiality of this discharge information lies with you and/or your care-partner. 

## 2020-07-23 NOTE — Op Note (Addendum)
[place] Patient Name: Mark Stevenson Procedure Date: 07/23/2020 11:22 AM MRN: [id number] Endoscopist: Ladene Artist , MD Age: 64 Referring MD:  Date of Birth: 19-Mar-1956 Gender: Male Account #: [id number] Procedure:                Colonoscopy Indications:              Surveillance: Personal history of adenomatous                            polyps on last colonoscopy 3 years ago. Family                            history of colon cancer. Medicines:                Monitored Anesthesia Care Procedure:                Pre-Anesthesia Assessment:                           - Prior to the procedure, a History and Physical                            was performed, and patient medications and                            allergies were reviewed. The patient's tolerance of                            previous anesthesia was also reviewed. The risks                            and benefits of the procedure and the sedation                            options and risks were discussed with the patient.                            All questions were answered, and informed consent                            was obtained. Prior Anticoagulants: The patient has                            taken no previous anticoagulant or antiplatelet                            agents. ASA Grade Assessment: III - A patient with                            severe systemic disease. After reviewing the risks                            and benefits, the patient was deemed in  satisfactory condition to undergo the procedure.                           After obtaining informed consent, the colonoscope                            was passed under direct vision. Throughout the                            procedure, the patient's blood pressure, pulse, and                            oxygen saturations were monitored continuously. The                            Colonoscope was introduced through  the anus and                            advanced to the the cecum, identified by                            appendiceal orifice and ileocecal valve. The                            ileocecal valve, appendiceal orifice, and rectum                            were photographed. The quality of the bowel                            preparation was adequate after extensive lavage and                            suction. The colonoscopy was performed without                            difficulty. The patient tolerated the procedure                            well. Scope In: 11:27:04 AM Scope Out: 11:45:19 AM Scope Withdrawal Time: 0 hours 16 minutes 4 seconds  Total Procedure Duration: 0 hours 18 minutes 15 seconds  Findings:                 The perianal and digital rectal examinations were                            normal.                           A 10 mm polyp was found in the cecum. The polyp was                            semi-pedunculated. The polyp was removed with a hot  snare. Resection and retrieval were complete.                           A tattoo was seen in the transverse colon. A                            post-polypectomy scar was found at the tattoo site.                            There was no evidence of residual polyp tissue.                           Two sessile polyps were found sigmoid colon. The                            polyps were 7 to 8 mm in size. These polyps were                            removed with a cold snare. Resection and retrieval                            were complete.                           A 5 mm polyp was found in the rectum. The polyp was                            sessile. The polyp was removed with a cold biopsy                            forceps. Resection and retrieval were complete.                           Multiple medium-mouthed diverticula were found in                            the left colon. There was no  evidence of                            diverticular bleeding.                           Internal hemorrhoids were found during                            retroflexion. The hemorrhoids were small and Grade                            I (internal hemorrhoids that do not prolapse).                           The exam was otherwise without abnormality on  direct and retroflexion views. Complications:            No immediate complications. Estimated blood loss:                            None. Estimated Blood Loss:     Estimated blood loss: none. Impression:               - One 10 mm polyp in the cecum, removed with a hot                            snare. Resected and retrieved.                           - Tattoo in the transverse colon. No residual polyp.                           - Two 7 to 8 mm polyps in the sigmoid colon,                            removed with a cold snare. Resected and retrieved.                           - One 5 mm polyp in the rectum, removed with a cold                            forceps. Resected and retrieved.                           - Mild diverticulosis in the left colon.                           - Internal hemorrhoids.                           - The examination was otherwise normal on direct                            and retroflexion views. Recommendation:           - Repeat colonoscopy in 3 years for surveillance                            with a more extensive bowel prep.                           - Patient has a contact number available for                            emergencies. The signs and symptoms of potential                            delayed complications were discussed with the  patient. Return to normal activities tomorrow.                            Written discharge instructions were provided to the                            patient.                           - Resume previous diet.                            - Continue present medications.                           - Await pathology results.                           - No aspirin, ibuprofen, naproxen, or other                            non-steroidal anti-inflammatory drugs for 2 weeks                            after polyp removal. Ladene Artist, MD 07/23/2020 11:54:11 AM This report has been signed electronically.

## 2020-07-23 NOTE — Progress Notes (Signed)
Called to room to assist during endoscopic procedure.  Patient ID and intended procedure confirmed with present staff. Received instructions for my participation in the procedure from the performing physician.  

## 2020-07-23 NOTE — Progress Notes (Signed)
JB - Check-in  CW - VS  Pt's states no medical or surgical changes since previsit or office visit. 

## 2020-07-25 ENCOUNTER — Telehealth: Payer: Self-pay

## 2020-07-25 NOTE — Telephone Encounter (Signed)
  Follow up Call-  Call back number 07/23/2020  Post procedure Call Back phone  # #[phone] cell  Permission to leave phone message Yes  Some recent data might be hidden     Patient questions:  Do you have a fever, pain , or abdominal swelling? No. Pain Score  0 *  Have you tolerated food without any problems? Yes.    Have you been able to return to your normal activities? Yes.    Do you have any questions about your discharge instructions: Diet   No. Medications  No. Follow up visit  No.  Do you have questions or concerns about your Care? No.  Actions: * If pain score is 4 or above: No action needed, pain <4.  Follow up Call-  Call back number 07/23/2020  Post procedure Call Back phone  # #[phone] cell  Permission to leave phone message Yes  Some recent data might be hidden     Patient questions:  1. Do you have a fever, pain , or abdominal swelling? Have you developed a fever since your procedure? no  2.   Have you had an respiratory symptoms (SOB or cough) since your procedure? no  3.   Have you tested positive for COVID 19 since your procedure no  4.   Have you had any family members/close contacts diagnosed with the COVID 19 since your procedure?  no   If yes to any of these questions please route to Joylene John, RN and Joella Prince, RN Pain Score  0 *  Have you tolerated food without any problems? Yes.    Have you been able to return to your normal activities? Yes.    Do you have any questions about your discharge instructions: Diet   No. Medications  No. Follow up visit  No.  Do you have questions or concerns about your Care? No.  Actions: * If pain score is 4 or above: No action needed, pain <4.

## 2020-07-31 ENCOUNTER — Encounter: Payer: Self-pay | Admitting: Gastroenterology

## 2020-12-31 DIAGNOSIS — H2513 Age-related nuclear cataract, bilateral: Secondary | ICD-10-CM | POA: Diagnosis not present

## 2020-12-31 DIAGNOSIS — H25013 Cortical age-related cataract, bilateral: Secondary | ICD-10-CM | POA: Diagnosis not present

## 2020-12-31 DIAGNOSIS — D3131 Benign neoplasm of right choroid: Secondary | ICD-10-CM | POA: Diagnosis not present

## 2020-12-31 DIAGNOSIS — I10 Essential (primary) hypertension: Secondary | ICD-10-CM | POA: Diagnosis not present

## 2021-01-30 DIAGNOSIS — Z0000 Encounter for general adult medical examination without abnormal findings: Secondary | ICD-10-CM | POA: Diagnosis not present

## 2021-01-30 DIAGNOSIS — Z23 Encounter for immunization: Secondary | ICD-10-CM | POA: Diagnosis not present

## 2021-01-30 DIAGNOSIS — J449 Chronic obstructive pulmonary disease, unspecified: Secondary | ICD-10-CM | POA: Diagnosis not present

## 2021-01-30 DIAGNOSIS — E7800 Pure hypercholesterolemia, unspecified: Secondary | ICD-10-CM | POA: Diagnosis not present

## 2021-01-30 DIAGNOSIS — I10 Essential (primary) hypertension: Secondary | ICD-10-CM | POA: Diagnosis not present

## 2021-01-30 DIAGNOSIS — J309 Allergic rhinitis, unspecified: Secondary | ICD-10-CM | POA: Diagnosis not present

## 2021-01-30 DIAGNOSIS — G629 Polyneuropathy, unspecified: Secondary | ICD-10-CM | POA: Diagnosis not present

## 2021-03-11 ENCOUNTER — Telehealth: Payer: Self-pay | Admitting: Emergency Medicine

## 2021-03-11 ENCOUNTER — Other Ambulatory Visit: Payer: Self-pay

## 2021-03-11 ENCOUNTER — Inpatient Hospital Stay
Admission: EM | Admit: 2021-03-11 | Discharge: 2021-03-19 | DRG: 478 | Disposition: A | Discharge status: Home Health | Payer: Medicare Other | Attending: Internal Medicine | Admitting: Internal Medicine

## 2021-03-11 ENCOUNTER — Emergency Department: Payer: Medicare Other

## 2021-03-11 DIAGNOSIS — R338 Other retention of urine: Secondary | ICD-10-CM

## 2021-03-11 DIAGNOSIS — R188 Other ascites: Secondary | ICD-10-CM

## 2021-03-11 DIAGNOSIS — Z8719 Personal history of other diseases of the digestive system: Secondary | ICD-10-CM | POA: Diagnosis not present

## 2021-03-11 DIAGNOSIS — W19XXXD Unspecified fall, subsequent encounter: Secondary | ICD-10-CM

## 2021-03-11 DIAGNOSIS — R339 Retention of urine, unspecified: Secondary | ICD-10-CM | POA: Diagnosis not present

## 2021-03-11 DIAGNOSIS — E871 Hypo-osmolality and hyponatremia: Secondary | ICD-10-CM | POA: Diagnosis not present

## 2021-03-11 DIAGNOSIS — T796XXA Traumatic ischemia of muscle, initial encounter: Secondary | ICD-10-CM | POA: Diagnosis not present

## 2021-03-11 DIAGNOSIS — D6959 Other secondary thrombocytopenia: Secondary | ICD-10-CM | POA: Diagnosis not present

## 2021-03-11 DIAGNOSIS — F1020 Alcohol dependence, uncomplicated: Secondary | ICD-10-CM | POA: Diagnosis present

## 2021-03-11 DIAGNOSIS — G8929 Other chronic pain: Secondary | ICD-10-CM | POA: Diagnosis not present

## 2021-03-11 DIAGNOSIS — R0689 Other abnormalities of breathing: Secondary | ICD-10-CM | POA: Diagnosis not present

## 2021-03-11 DIAGNOSIS — M546 Pain in thoracic spine: Secondary | ICD-10-CM | POA: Diagnosis not present

## 2021-03-11 DIAGNOSIS — W19XXXA Unspecified fall, initial encounter: Secondary | ICD-10-CM | POA: Diagnosis present

## 2021-03-11 DIAGNOSIS — S32020S Wedge compression fracture of second lumbar vertebra, sequela: Secondary | ICD-10-CM | POA: Diagnosis not present

## 2021-03-11 DIAGNOSIS — T796XXD Traumatic ischemia of muscle, subsequent encounter: Secondary | ICD-10-CM

## 2021-03-11 DIAGNOSIS — Z96642 Presence of left artificial hip joint: Secondary | ICD-10-CM | POA: Diagnosis not present

## 2021-03-11 DIAGNOSIS — W010XXA Fall on same level from slipping, tripping and stumbling without subsequent striking against object, initial encounter: Secondary | ICD-10-CM | POA: Diagnosis present

## 2021-03-11 DIAGNOSIS — M549 Dorsalgia, unspecified: Secondary | ICD-10-CM | POA: Diagnosis not present

## 2021-03-11 DIAGNOSIS — K567 Ileus, unspecified: Secondary | ICD-10-CM | POA: Diagnosis not present

## 2021-03-11 DIAGNOSIS — S32029A Unspecified fracture of second lumbar vertebra, initial encounter for closed fracture: Secondary | ICD-10-CM | POA: Diagnosis not present

## 2021-03-11 DIAGNOSIS — D696 Thrombocytopenia, unspecified: Secondary | ICD-10-CM | POA: Diagnosis present

## 2021-03-11 DIAGNOSIS — Z743 Need for continuous supervision: Secondary | ICD-10-CM | POA: Diagnosis not present

## 2021-03-11 DIAGNOSIS — I10 Essential (primary) hypertension: Secondary | ICD-10-CM | POA: Diagnosis present

## 2021-03-11 DIAGNOSIS — R251 Tremor, unspecified: Secondary | ICD-10-CM | POA: Diagnosis not present

## 2021-03-11 DIAGNOSIS — J449 Chronic obstructive pulmonary disease, unspecified: Secondary | ICD-10-CM | POA: Diagnosis not present

## 2021-03-11 DIAGNOSIS — M47816 Spondylosis without myelopathy or radiculopathy, lumbar region: Secondary | ICD-10-CM | POA: Diagnosis not present

## 2021-03-11 DIAGNOSIS — R52 Pain, unspecified: Secondary | ICD-10-CM

## 2021-03-11 DIAGNOSIS — Z981 Arthrodesis status: Secondary | ICD-10-CM | POA: Diagnosis not present

## 2021-03-11 DIAGNOSIS — Z20822 Contact with and (suspected) exposure to covid-19: Secondary | ICD-10-CM | POA: Diagnosis not present

## 2021-03-11 DIAGNOSIS — S32020A Wedge compression fracture of second lumbar vertebra, initial encounter for closed fracture: Secondary | ICD-10-CM | POA: Diagnosis not present

## 2021-03-11 DIAGNOSIS — S22080A Wedge compression fracture of T11-T12 vertebra, initial encounter for closed fracture: Secondary | ICD-10-CM

## 2021-03-11 DIAGNOSIS — M47814 Spondylosis without myelopathy or radiculopathy, thoracic region: Secondary | ICD-10-CM | POA: Diagnosis not present

## 2021-03-11 DIAGNOSIS — T796XXS Traumatic ischemia of muscle, sequela: Secondary | ICD-10-CM | POA: Diagnosis not present

## 2021-03-11 DIAGNOSIS — Z79899 Other long term (current) drug therapy: Secondary | ICD-10-CM | POA: Diagnosis not present

## 2021-03-11 DIAGNOSIS — M6282 Rhabdomyolysis: Secondary | ICD-10-CM | POA: Diagnosis present

## 2021-03-11 DIAGNOSIS — I499 Cardiac arrhythmia, unspecified: Secondary | ICD-10-CM | POA: Diagnosis not present

## 2021-03-11 DIAGNOSIS — F17210 Nicotine dependence, cigarettes, uncomplicated: Secondary | ICD-10-CM | POA: Diagnosis present

## 2021-03-11 DIAGNOSIS — F1010 Alcohol abuse, uncomplicated: Secondary | ICD-10-CM

## 2021-03-11 DIAGNOSIS — E7800 Pure hypercholesterolemia, unspecified: Secondary | ICD-10-CM | POA: Diagnosis present

## 2021-03-11 DIAGNOSIS — S22089A Unspecified fracture of T11-T12 vertebra, initial encounter for closed fracture: Secondary | ICD-10-CM | POA: Diagnosis not present

## 2021-03-11 DIAGNOSIS — M545 Low back pain, unspecified: Secondary | ICD-10-CM | POA: Diagnosis not present

## 2021-03-11 DIAGNOSIS — R109 Unspecified abdominal pain: Secondary | ICD-10-CM | POA: Diagnosis not present

## 2021-03-11 DIAGNOSIS — Z85828 Personal history of other malignant neoplasm of skin: Secondary | ICD-10-CM

## 2021-03-11 DIAGNOSIS — R0902 Hypoxemia: Secondary | ICD-10-CM | POA: Diagnosis not present

## 2021-03-11 DIAGNOSIS — Z419 Encounter for procedure for purposes other than remedying health state, unspecified: Secondary | ICD-10-CM

## 2021-03-11 DIAGNOSIS — M4805 Spinal stenosis, thoracolumbar region: Secondary | ICD-10-CM | POA: Diagnosis not present

## 2021-03-11 DIAGNOSIS — S22080S Wedge compression fracture of T11-T12 vertebra, sequela: Secondary | ICD-10-CM | POA: Diagnosis not present

## 2021-03-11 DIAGNOSIS — J441 Chronic obstructive pulmonary disease with (acute) exacerbation: Secondary | ICD-10-CM | POA: Diagnosis not present

## 2021-03-11 DIAGNOSIS — E876 Hypokalemia: Secondary | ICD-10-CM

## 2021-03-11 DIAGNOSIS — S32028A Other fracture of second lumbar vertebra, initial encounter for closed fracture: Secondary | ICD-10-CM | POA: Diagnosis present

## 2021-03-11 DIAGNOSIS — S32000A Wedge compression fracture of unspecified lumbar vertebra, initial encounter for closed fracture: Secondary | ICD-10-CM | POA: Diagnosis not present

## 2021-03-11 DIAGNOSIS — M4319 Spondylolisthesis, multiple sites in spine: Secondary | ICD-10-CM | POA: Diagnosis not present

## 2021-03-11 DIAGNOSIS — E278 Other specified disorders of adrenal gland: Secondary | ICD-10-CM | POA: Diagnosis not present

## 2021-03-11 DIAGNOSIS — R6889 Other general symptoms and signs: Secondary | ICD-10-CM | POA: Diagnosis not present

## 2021-03-11 DIAGNOSIS — S22000A Wedge compression fracture of unspecified thoracic vertebra, initial encounter for closed fracture: Secondary | ICD-10-CM

## 2021-03-11 LAB — URINALYSIS, COMPLETE (UACMP) WITH MICROSCOPIC
Bacteria, UA: NONE SEEN
Bilirubin Urine: NEGATIVE
Glucose, UA: NEGATIVE mg/dL
Ketones, ur: 20 mg/dL — AB
Leukocytes,Ua: NEGATIVE
Nitrite: NEGATIVE
Protein, ur: 30 mg/dL — AB
Specific Gravity, Urine: 1.017 (ref 1.005–1.030)
pH: 5 (ref 5.0–8.0)

## 2021-03-11 LAB — BASIC METABOLIC PANEL
Anion gap: 12 (ref 5–15)
BUN: 23 mg/dL (ref 8–23)
CO2: 27 mmol/L (ref 22–32)
Calcium: 9.3 mg/dL (ref 8.9–10.3)
Chloride: 90 mmol/L — ABNORMAL LOW (ref 98–111)
Creatinine, Ser: 0.96 mg/dL (ref 0.61–1.24)
GFR, Estimated: >60 mL/min (ref >60)
Glucose, Bld: 107 mg/dL — ABNORMAL HIGH (ref 70–99)
Potassium: 3.6 mmol/L (ref 3.5–5.1)
Sodium: 129 mmol/L — ABNORMAL LOW (ref 135–145)

## 2021-03-11 LAB — CBC
HCT: 37.8 % — ABNORMAL LOW (ref 39.0–52.0)
Hemoglobin: 13.7 g/dL (ref 13.0–17.0)
MCH: 36.5 pg — ABNORMAL HIGH (ref 26.0–34.0)
MCHC: 36.2 g/dL — ABNORMAL HIGH (ref 30.0–36.0)
MCV: 100.8 fL — ABNORMAL HIGH (ref 80.0–100.0)
Platelets: 102 10*3/uL — ABNORMAL LOW (ref 150–400)
RBC: 3.75 MIL/uL — ABNORMAL LOW (ref 4.22–5.81)
RDW: 11.9 % (ref 11.5–15.5)
WBC: 14.1 10*3/uL — ABNORMAL HIGH (ref 4.0–10.5)
nRBC: 0 % (ref 0.0–0.2)

## 2021-03-11 LAB — SARS CORONAVIRUS 2 (TAT 6-24 HRS): SARS Coronavirus 2: NEGATIVE

## 2021-03-11 LAB — ETHANOL: Alcohol, Ethyl (B): <10 mg/dL (ref <10)

## 2021-03-11 LAB — PHOSPHORUS: Phosphorus: 3.7 mg/dL (ref 2.5–4.6)

## 2021-03-11 LAB — CK: Total CK: 3230 U/L — ABNORMAL HIGH (ref 49–397)

## 2021-03-11 LAB — MAGNESIUM: Magnesium: 1.7 mg/dL (ref 1.7–2.4)

## 2021-03-11 MED ORDER — LOSARTAN POTASSIUM 50 MG PO TABS
100.0000 mg | ORAL_TABLET | Freq: Every day | ORAL | Status: DC
  Administered 2021-03-11 – 2021-03-19 (×9): 100 mg via ORAL
  Filled 2021-03-11 (×10): qty 2

## 2021-03-11 MED ORDER — LORATADINE 10 MG PO TABS: 10.0000 mg | ORAL_TABLET | Freq: Every day | ORAL | Status: DC | PRN

## 2021-03-11 MED ORDER — MOMETASONE FURO-FORMOTEROL FUM 200-5 MCG/ACT IN AERO
2.0000 | INHALATION_SPRAY | Freq: Two times a day (BID) | RESPIRATORY_TRACT | Status: DC
  Administered 2021-03-11 – 2021-03-19 (×16): 2 via RESPIRATORY_TRACT
  Filled 2021-03-11: qty 8.8

## 2021-03-11 MED ORDER — ADULT MULTIVITAMIN W/MINERALS CH
1.0000 | ORAL_TABLET | Freq: Every day | ORAL | Status: DC
  Administered 2021-03-11 – 2021-03-19 (×8): 1 via ORAL
  Filled 2021-03-11 (×9): qty 1

## 2021-03-11 MED ORDER — LORAZEPAM 2 MG PO TABS: 0.0000 mg | ORAL_TABLET | Freq: Two times a day (BID) | ORAL | Status: DC

## 2021-03-11 MED ORDER — THIAMINE HCL 100 MG/ML IJ SOLN
100.0000 mg | Freq: Every day | INTRAMUSCULAR | Status: DC
  Filled 2021-03-11 (×2): qty 2

## 2021-03-11 MED ORDER — SODIUM CHLORIDE 0.9 % IV SOLN
1000.0000 mL | Freq: Once | INTRAVENOUS | Status: AC
  Administered 2021-03-11: 1000 mL via INTRAVENOUS

## 2021-03-11 MED ORDER — SODIUM CHLORIDE 0.45 % IV SOLN: INTRAVENOUS | Status: DC

## 2021-03-11 MED ORDER — VITAMIN D 25 MCG (1000 UNIT) PO TABS
5000.0000 [IU] | ORAL_TABLET | Freq: Every day | ORAL | Status: DC
  Administered 2021-03-11 – 2021-03-19 (×8): 5000 [IU] via ORAL
  Filled 2021-03-11 (×9): qty 5

## 2021-03-11 MED ORDER — HYDROCODONE-ACETAMINOPHEN 5-325 MG PO TABS
1.0000 | ORAL_TABLET | Freq: Four times a day (QID) | ORAL | Status: DC | PRN
  Administered 2021-03-11 – 2021-03-17 (×14): 1 via ORAL
  Filled 2021-03-11 (×14): qty 1

## 2021-03-11 MED ORDER — OXYCODONE-ACETAMINOPHEN 5-325 MG PO TABS
1.0000 | ORAL_TABLET | Freq: Once | ORAL | Status: AC
  Administered 2021-03-11: 1 via ORAL
  Filled 2021-03-11: qty 1

## 2021-03-11 MED ORDER — FOLIC ACID 1 MG PO TABS
1.0000 mg | ORAL_TABLET | Freq: Every day | ORAL | Status: DC
  Administered 2021-03-11 – 2021-03-19 (×8): 1 mg via ORAL
  Filled 2021-03-11 (×8): qty 1

## 2021-03-11 MED ORDER — LORAZEPAM 2 MG/ML IJ SOLN: 1.0000 mg | INTRAMUSCULAR | Status: AC | PRN

## 2021-03-11 MED ORDER — NICOTINE 21 MG/24HR TD PT24
21.0000 mg | MEDICATED_PATCH | Freq: Every day | TRANSDERMAL | Status: DC
  Administered 2021-03-11 – 2021-03-19 (×9): 21 mg via TRANSDERMAL
  Filled 2021-03-11 (×9): qty 1

## 2021-03-11 MED ORDER — ONDANSETRON HCL 4 MG PO TABS: 4.0000 mg | ORAL_TABLET | Freq: Four times a day (QID) | ORAL | Status: DC | PRN

## 2021-03-11 MED ORDER — CYCLOBENZAPRINE HCL 10 MG PO TABS
10.0000 mg | ORAL_TABLET | Freq: Three times a day (TID) | ORAL | Status: DC | PRN
  Administered 2021-03-12 – 2021-03-17 (×5): 10 mg via ORAL
  Filled 2021-03-11 (×5): qty 1

## 2021-03-11 MED ORDER — ONDANSETRON HCL 4 MG/2ML IJ SOLN: 4.0000 mg | Freq: Four times a day (QID) | INTRAMUSCULAR | Status: DC | PRN

## 2021-03-11 MED ORDER — ALBUTEROL SULFATE (2.5 MG/3ML) 0.083% IN NEBU
3.0000 mL | INHALATION_SOLUTION | Freq: Four times a day (QID) | RESPIRATORY_TRACT | Status: DC | PRN
  Administered 2021-03-11: 3 mL via RESPIRATORY_TRACT
  Filled 2021-03-11: qty 3

## 2021-03-11 MED ORDER — LORAZEPAM 1 MG PO TABS: 1.0000 mg | ORAL_TABLET | ORAL | Status: AC | PRN

## 2021-03-11 MED ORDER — LORAZEPAM 2 MG PO TABS
0.0000 mg | ORAL_TABLET | Freq: Four times a day (QID) | ORAL | Status: DC
Start: 2021-03-11 — End: 2021-03-13

## 2021-03-11 MED ORDER — THIAMINE HCL 100 MG PO TABS
100.0000 mg | ORAL_TABLET | Freq: Every day | ORAL | Status: DC
  Administered 2021-03-11 – 2021-03-19 (×8): 100 mg via ORAL
  Filled 2021-03-11 (×9): qty 1

## 2021-03-11 NOTE — H&P (Signed)
History and Physical    Mark Stevenson:[id number] DOB: 01-08-1956 DOA: 03/11/2021  PCP: Alroy Dust, L.Marlou Sa, MD   Patient coming from: Home  I have personally briefly reviewed patient's old medical records in [place]  Chief Complaint: Fall  HPI: Mark Stevenson is a 65 y.o. male with medical history significant for nicotine dependence, alcohol abuse, chronic low back pain, asthma and hypertension who was brought into the ER by EMS after he was found on the floor by his neighbor.  Patient states that he tripped over his dog 3 days prior to his admission and fell landing on his back.  He developed severe low back pain and was unable to get up so he laid on the floor for the last 3 days.  His neighbor had gone to check on him and found him on the floor and called EMS. He has a history of chronic low back pain and has had prior compression fractures in the past which were managed conservatively.   He denied having any loss of consciousness, no headache, no dizziness, no lightheadedness, no nausea, no vomiting, no chest pain, no shortness of breath, no fever, no chills, no cough, no urinary or fecal incontinence, no diaphoresis or palpitations. Labs show sodium 129, potassium 3.6, chloride 90, bicarb 27, glucose 107, BUN 23, creatinine 0.96, calcium 9.3, total CK3 230, white count 14.1, hemoglobin 13.7, hematocrit 37.8, MCV 100.8, RDW 11.9, platelet count 102 Lumbar spine x-ray shows new mild L2 superior endplate compression fracture, likely acute given clinical history. Correlate with point tenderness. Chronic mild L1 compression fracture. Thoracic spine x-ray shows acute on chronic T11 compression fracture, with progressive now severe height loss compared to the prior study. Chest x-ray reviewed by me shows no acute cardiopulmonary disease Twelve-lead EKG reviewed by me shows normal sinus rhythm.   ED Course: Patient is a 65 year old male who presents to the emergency room via EMS  after he was found on the floor by his neighbor. He tripped on his dog and fell and was on the floor for 3 days due to severe low back pain. Labs showed rhabdomyolysis and hyponatremia and imaging shows new compression fracture involving L2 and worsening T11 compression fracture. He will be admitted to the hospital for further evaluation.    Review of Systems: As per HPI otherwise all other systems reviewed and negative.    Past Medical History:  Diagnosis Date   Arthritis    Cancer ([place])    skin cancer bil knees   COPD (chronic obstructive pulmonary disease) (HCC)    Dyspnea    increased exertion; pt states can walk a flight of stairs w/o having to stop to catch breath    Hypertension    Nicotine dependence, cigarettes, uncomplicated    Peripheral neuropathy    Pure hypercholesterolemia    Sebaceous cyst    scrotal area    Viral wart     Past Surgical History:  Procedure Laterality Date   ankles     tarsal tunnel surgery bilat   BACK SURGERY  2008 and 10/22/16   herniated disc   broken knee cap  2002   calf surgery Bilateral    lengethen the tendons   COLONOSCOPY     crushed L-4 vertebrae  1980   KNEE ARTHROSCOPY     x3 bilat/ 2 times left knee   LEG SKIN LESION  BIOPSY / EXCISION     Bil knees   LUMBAR LAMINECTOMY/DECOMPRESSION MICRODISCECTOMY N/A 10/22/2016  Procedure: MICRO LUMBAR DECOMPRESSION L3-L4, L4-L5 REVISION  2 LEVELS;  Surgeon: Susa Day, MD;  Location: [facility];  Service: Orthopedics;  Laterality: N/A;  Requests 150 mins   PERIPHERAL VASCULAR CATHETERIZATION N/A 04/22/2016   Procedure: Abdominal Aortogram w/ bilateral Lower Extremity Runoff;  Surgeon: Serafina Mitchell, MD;  Location: [facility];  Service: Cardiovascular;  Laterality: N/A;   PERIPHERAL VASCULAR CATHETERIZATION Left 04/22/2016   Procedure: Peripheral Vascular Atherectomy;  Surgeon: Serafina Mitchell, MD;  Location: [facility] [facility];  Service: Cardiovascular;  Laterality: Left;    POLYPECTOMY     ruptured disc  2008   SHOULDER OPEN ROTATOR CUFF REPAIR Right 03/19/2017   Procedure: Mini open rotator cuff repair;  Surgeon: Susa Day, MD;  Location: [facility];  Service: Orthopedics;  Laterality: Right;  with block   SHOULDER SURGERY Right 2019   tendons in calves lengthened     plantar fascitis   TOTAL HIP ARTHROPLASTY  2012   left     reports that he has been smoking cigarettes. He has a 63.00 pack-year smoking history. He has never used smokeless tobacco. He reports current alcohol use of about 10.0 standard drinks of alcohol per week. He reports that he does not use drugs.  No Known Allergies  Family History  Problem Relation Age of Onset   Colon cancer Son    Liver cancer Son    Lung cancer Father    Pancreatic cancer Neg Hx    Stomach cancer Neg Hx    Esophageal cancer Neg Hx    Rectal cancer Neg Hx       Prior to Admission medications   Medication Sig Start Date End Date Taking? Authorizing Provider  gabapentin (NEURONTIN) 600 MG tablet Take 600 mg by mouth at bedtime.    Yes [provider]  loratadine (CLARITIN) 10 MG tablet Take 10 mg by mouth daily as needed for allergies.   Yes [provider]  ADVAIR DISKUS 250-50 MCG/DOSE AEPB Inhale 1 puff into the lungs 2 (two) times daily.  03/21/16   [provider]  Cholecalciferol (VITAMIN D3) 125 MCG (5000 UT) TABS Take 5,000 Units by mouth daily.    [provider]  cyclobenzaprine (FLEXERIL) 10 MG tablet Take 1 tablet (10 mg total) by mouth 3 (three) times daily as needed for muscle spasms. Patient not taking: No sig reported 10/14/19   Norval Gable, MD  HYDROcodone-acetaminophen (NORCO/VICODIN) 5-325 MG tablet 1-2 tabs po q 8 hours prn Patient not taking: No sig reported 10/14/19   Norval Gable, MD  losartan (COZAAR) 100 MG tablet Take 100 mg by mouth daily.    [provider]  lovastatin (MEVACOR) 20 MG tablet Take 20 mg by mouth daily.     [provider]  PROAIR HFA 108 [phone] Base) MCG/ACT inhaler Inhale 2 puffs into the lungs every 6 (six) hours as needed for shortness of breath or wheezing. 02/11/17   [provider]  triamcinolone cream (KENALOG) 0.1 % Apply 1 application topically daily as needed for rash. 08/19/16   [provider]  triamterene-hydrochlorothiazide (MAXZIDE-25) 37.5-25 MG per tablet Take 1 tablet by mouth daily. Patient not taking: Reported on 03/11/2021    [provider]    Physical Exam: Vitals:   03/11/21 1324 03/11/21 1400 03/11/21 1600 03/11/21 1615  BP: (!) 156/107 (!) 138/122 (!) 142/98 (!) 142/88  Pulse: 84 75    Resp: 20 19 (!) 23 16  Temp: 98 F (  36.7 C)     TempSrc: Oral     SpO2: 100% 100% 100%   Weight:      Height:         Vitals:   03/11/21 1324 03/11/21 1400 03/11/21 1600 03/11/21 1615  BP: (!) 156/107 (!) 138/122 (!) 142/98 (!) 142/88  Pulse: 84 75    Resp: 20 19 (!) 23 16  Temp: 98 F (36.7 C)     TempSrc: Oral     SpO2: 100% 100% 100%   Weight:      Height:          Constitutional: Alert and oriented x 3 . Not in any apparent distress HEENT:      Head: Normocephalic and atraumatic.         Eyes: PERLA, EOMI, Conjunctivae are normal. Sclera is non-icteric.       Mouth/Throat: Mucous membranes are moist.       Neck: Supple with no signs of meningismus. Cardiovascular: Regular rate and rhythm. No murmurs, gallops, or rubs. 2+ symmetrical distal pulses are present . No JVD. No LE edema Respiratory: Respiratory effort normal .Lungs sounds clear bilaterally. No wheezes, crackles, or rhonchi.  Gastrointestinal: Soft, non tender, and non distended with positive bowel sounds.  Genitourinary: No CVA tenderness. Musculoskeletal: Nontender with normal range of motion in all extremities. No cyanosis, or erythema of extremities. Neurologic:  Face is symmetric. Moving all extremities. No gross focal neurologic deficits .  Able to move all  extremities Skin: Ecchymosis on forearms Psychiatric: Mood and affect are normal    Labs on Admission: I have personally reviewed following labs and imaging studies  CBC: Recent Labs  Lab 03/11/21 1332  WBC 14.1*  HGB 13.7  HCT 37.8*  MCV 100.8*  PLT 177*   Basic Metabolic Panel: Recent Labs  Lab 03/11/21 1332  NA 129*  K 3.6  CL 90*  CO2 27  GLUCOSE 107*  BUN 23  CREATININE 0.96  CALCIUM 9.3   GFR: Estimated Creatinine Clearance: 80.6 mL/min (by C-G formula based on SCr of 0.96 mg/dL). Liver Function Tests: No results for input(s): AST, ALT, ALKPHOS, BILITOT, PROT, ALBUMIN in the last 168 hours. No results for input(s): LIPASE, AMYLASE in the last 168 hours. No results for input(s): AMMONIA in the last 168 hours. Coagulation Profile: No results for input(s): INR, PROTIME in the last 168 hours. Cardiac Enzymes: Recent Labs  Lab 03/11/21 1332  CKTOTAL 3,230*   BNP (last 3 results) No results for input(s): PROBNP in the last 8760 hours. HbA1C: No results for input(s): HGBA1C in the last 72 hours. CBG: No results for input(s): GLUCAP in the last 168 hours. Lipid Profile: No results for input(s): CHOL, HDL, LDLCALC, TRIG, CHOLHDL, LDLDIRECT in the last 72 hours. Thyroid Function Tests: No results for input(s): TSH, T4TOTAL, FREET4, T3FREE, THYROIDAB in the last 72 hours. Anemia Panel: No results for input(s): VITAMINB12, FOLATE, FERRITIN, TIBC, IRON, RETICCTPCT in the last 72 hours. Urine analysis:    Component Value Date/Time   COLORURINE AMBER (A) 03/11/2021 1415   APPEARANCEUR CLEAR (A) 03/11/2021 1415   LABSPEC 1.017 03/11/2021 1415   PHURINE 5.0 03/11/2021 1415   GLUCOSEU NEGATIVE 03/11/2021 1415   HGBUR SMALL (A) 03/11/2021 1415   BILIRUBINUR NEGATIVE 03/11/2021 1415   KETONESUR 20 (A) 03/11/2021 1415   PROTEINUR 30 (A) 03/11/2021 1415   UROBILINOGEN 0.2 04/01/2011 1145   NITRITE NEGATIVE 03/11/2021 1415   LEUKOCYTESUR NEGATIVE 03/11/2021 1415     Radiological Exams  on Admission: DG Chest 2 View  Result Date: 03/11/2021 CLINICAL DATA:  Back pain after recent fall. EXAM: CHEST - 2 VIEW COMPARISON:  Chest x-ray dated August 27, 2017. FINDINGS: The heart size and mediastinal contours are within normal limits. Normal pulmonary vascularity. No focal consolidation, pleural effusion, or pneumothorax. Bilateral nipple shadows again noted. Acute on chronic T11 compression fracture as described on concurrent thoracic spine x-rays from same day. IMPRESSION: 1. No acute cardiopulmonary disease. 2. Acute on chronic T11 compression fracture as described on concurrent thoracic spine x-rays from same day. Electronically Signed   By: Titus Dubin M.D.   On: 03/11/2021 15:11   DG Thoracic Spine 2 View  Result Date: 03/11/2021 CLINICAL DATA:  Back pain after fall. EXAM: THORACIC SPINE 2 VIEWS COMPARISON:  Lumbar spine x-rays dated October 14, 2019. Chest x-ray dated August 27, 2017. FINDINGS: Progressive now severe T11 vertebral body height loss since prior study. Remaining thoracic vertebral body heights are preserved. Alignment is normal. Disc spaces are relatively maintained. IMPRESSION: 1. Acute on chronic T11 compression fracture, with progressive now severe height loss compared to the prior study. Electronically Signed   By: Titus Dubin M.D.   On: 03/11/2021 15:09   DG Lumbar Spine 2-3 Views  Result Date: 03/11/2021 CLINICAL DATA:  Low back pain after fall. EXAM: LUMBAR SPINE - 2-3 VIEW COMPARISON:  Lumbar spine x-rays dated October 14, 2019. FINDINGS: New mild L2 superior endplate compression fracture. Chronic mild L1 superior endplate compression fracture. Normal alignment. Unchanged mild multilevel lumbar spondylosis. Osteopenia. Prior left total hip arthroplasty. IMPRESSION: 1. New mild L2 superior endplate compression fracture, likely acute given clinical history. Correlate with point tenderness. 2. Chronic mild L1 compression fracture.  Electronically Signed   By: Titus Dubin M.D.   On: 03/11/2021 15:06     Assessment/Plan Principal Problem:   Fall Active Problems:   Closed compression fracture of L2 lumbar vertebra, initial encounter ([place])   Rhabdomyolysis   COPD (chronic obstructive pulmonary disease) (HCC)   Hypertension   Thrombocytopenia (HCC)   No   Alcohol dependence ([place])     Status post fall With new close compression fracture of L2 vertebrae as well as acute on chronic T11 compression fracture Place patient on fall precautions Pain control and muscle relaxants We will consult orthopedic surgery for possible kyphoplasty.     Rhabdomyolysis Following a fall Patient was on the ground for 3 days due to severe low back pain and inability to ambulate IV fluid hydration Repeat CK levels in a.m.    Hypertension Continue losartan    History of COPD Stable and not acutely exacerbated Continue as needed bronchodilator therapy as well as inhaled steroids    History of nicotine dependence Smoking cessation has been discussed with patient in detail We will place patient on a nicotine transdermal patch 21 mg daily    History of alcohol dependence We will place patient on CIWA protocol and administer lorazepam for CIWA score of 8 or greater Place patient on thiamine, folic acid and MVI     Thrombocytopenia Most likely secondary to bone marrow suppression from alcohol dependence We will place patient on SCDs for now due to increased risk of bleeding    Hyponatremia Most likely secondary to poor oral intake IV fluid hydration Repeat sodium levels in am   DVT prophylaxis: SCD  Code Status: full code  Family Communication: Greater than 50% of time was spent discussing patient's condition and plan of care with him at  the bedside.  All questions and concerns have been addressed.  He verbalizes understanding and agrees with the plan. Disposition Plan: Back to previous home  environment Consults called: Orthopedic surgery Status: At the time of admission, it appears that the appropriate admission status for this patient is inpatient. This is judged to be reasonable and necessary in order to provide the required intensity of service to ensure the patient's safety given the presenting symptoms, physical exam findings, and initial radiographic and laboratory data in the context of their comorbid conditions. Patient requires inpatient status due to high intensity of service, high risk for further deterioration and high frequency of surveillance required.    Collier Bullock MD [facility]     03/11/2021, 5:03 PM

## 2021-03-11 NOTE — ED Provider Notes (Signed)
[facility] Emergency Department Provider Note   ____________________________________________    I have reviewed the triage vital signs and the nursing notes.   HISTORY  Chief Complaint Fall     HPI ELMO RIO is a 65 y.o. male who presents after complaints of a fall.  Patient has a history of COPD, hypertension, multiple back surgeries and additional history as noted below who reports that he fell on Friday.  He does admit to drinking alcohol on that day.  He reports that he was on the floor until today when someone came in and found him.  Complains of chronic back pain, but worse than typical today  Past Medical History:  Diagnosis Date   Arthritis    Cancer ([place])    skin cancer bil knees   COPD (chronic obstructive pulmonary disease) (HCC)    Dyspnea    increased exertion; pt states can walk a flight of stairs w/o having to stop to catch breath    Hypertension    Nicotine dependence, cigarettes, uncomplicated    Peripheral neuropathy    Pure hypercholesterolemia    Sebaceous cyst    scrotal area    Viral wart     Patient Active Problem List   Diagnosis Date Noted   Fall 03/11/2021   Closed compression fracture of L2 lumbar vertebra, initial encounter ([place]) 03/11/2021   Complete rotator cuff tear 03/19/2017   Spinal stenosis of lumbar region 10/22/2016    Past Surgical History:  Procedure Laterality Date   ankles     tarsal tunnel surgery bilat   BACK SURGERY  2008 and 10/22/16   herniated disc   broken knee cap  2002   calf surgery Bilateral    lengethen the tendons   COLONOSCOPY     crushed L-4 vertebrae  1980   KNEE ARTHROSCOPY     x3 bilat/ 2 times left knee   LEG SKIN LESION  BIOPSY / EXCISION     Bil knees   LUMBAR LAMINECTOMY/DECOMPRESSION MICRODISCECTOMY N/A 10/22/2016   Procedure: MICRO LUMBAR DECOMPRESSION L3-L4, L4-L5 REVISION  2 LEVELS;  Surgeon: Susa Day, MD;  Location: [facility];  Service: Orthopedics;   Laterality: N/A;  Requests 150 mins   PERIPHERAL VASCULAR CATHETERIZATION N/A 04/22/2016   Procedure: Abdominal Aortogram w/ bilateral Lower Extremity Runoff;  Surgeon: Serafina Mitchell, MD;  Location: [facility];  Service: Cardiovascular;  Laterality: N/A;   PERIPHERAL VASCULAR CATHETERIZATION Left 04/22/2016   Procedure: Peripheral Vascular Atherectomy;  Surgeon: Serafina Mitchell, MD;  Location: [facility];  Service: Cardiovascular;  Laterality: Left;   POLYPECTOMY     ruptured disc  2008   SHOULDER OPEN ROTATOR CUFF REPAIR Right 03/19/2017   Procedure: Mini open rotator cuff repair;  Surgeon: Susa Day, MD;  Location: [facility];  Service: Orthopedics;  Laterality: Right;  with block   SHOULDER SURGERY Right 2019   tendons in calves lengthened     plantar fascitis   TOTAL HIP ARTHROPLASTY  2012   left    Prior to Admission medications   Medication Sig Start Date End Date Taking? Authorizing Provider  ADVAIR DISKUS 250-50 MCG/DOSE AEPB Inhale 1 puff into the lungs 2 (two) times daily.  03/21/16   [provider]  Cholecalciferol (VITAMIN D3) 2000 UNITS TABS Take 2,000 Units by mouth daily.     [provider]  cyclobenzaprine (FLEXERIL) 10 MG tablet Take 1 tablet (10 mg total) by mouth 3 (three)  times daily as needed for muscle spasms. Patient not taking: Reported on 07/04/2020 10/14/19   Norval Gable, MD  gabapentin (NEURONTIN) 600 MG tablet Take 600 mg by mouth at bedtime.     [provider]  HYDROcodone-acetaminophen (NORCO/VICODIN) 5-325 MG tablet 1-2 tabs po q 8 hours prn Patient not taking: Reported on 07/04/2020 10/14/19   Norval Gable, MD  losartan (COZAAR) 100 MG tablet Take 100 mg by mouth daily.    [provider]  lovastatin (MEVACOR) 20 MG tablet Take 20 mg by mouth daily.     [provider]  PROAIR HFA 108 [phone] Base) MCG/ACT inhaler Inhale 2 puffs into the lungs every 6 (six) hours as needed for shortness of breath or  wheezing. 02/11/17   [provider]  triamcinolone cream (KENALOG) 0.1 % Apply 1 application topically daily as needed for rash. 08/19/16   [provider]  triamterene-hydrochlorothiazide (MAXZIDE-25) 37.5-25 MG per tablet Take 1 tablet by mouth daily.    [provider]     Allergies Patient has no known allergies.  Family History  Problem Relation Age of Onset   Colon cancer Son    Liver cancer Son    Lung cancer Father    Pancreatic cancer Neg Hx    Stomach cancer Neg Hx    Esophageal cancer Neg Hx    Rectal cancer Neg Hx     Social History Social History   Tobacco Use   Smoking status: Every Day    Packs/day: 1.50    Years: 42.00    Pack years: 63.00    Types: Cigarettes   Smokeless tobacco: Never  Vaping Use   Vaping Use: Never used  Substance Use Topics   Alcohol use: Yes    Alcohol/week: 10.0 standard drinks    Types: 10 Shots of liquor per week    Comment: 3 days per week vodka    Drug use: No    Review of Systems  Constitutional: No fever/chills Eyes: No visual changes.  ENT: No sore throat. Cardiovascular: Denies chest pain. Respiratory: Denies shortness of breath.  Positive cough Gastrointestinal: No abdominal pain.  No nausea, no vomiting.   Genitourinary: Negative for dysuria. Musculoskeletal: As above Skin: Negative for rash. Neurological: Negative for headaches, no focal weakness   ____________________________________________   PHYSICAL EXAM:  VITAL SIGNS: ED Triage Vitals  Enc Vitals Group     BP 03/11/21 1324 (!) 156/107     Pulse Rate 03/11/21 1324 84     Resp 03/11/21 1324 20     Temp 03/11/21 1324 98 F (36.7 C)     Temp Source 03/11/21 1324 Oral     SpO2 03/11/21 1324 100 %     Weight 03/11/21 1321 77.1 kg (170 lb)     Height 03/11/21 1321 1.803 m (5\' 11" )     Head Circumference --      Peak Flow --      Pain Score 03/11/21 1321 5     Pain Loc --      Pain Edu? --      Excl. in [place]? --      Constitutional: Alert and oriented.   Nose: No congestion/rhinnorhea. Mouth/Throat: Mucous membranes are moist.   Neck:  Painless ROM Cardiovascular: Normal rate, regular rhythm. Grossly normal heart sounds.  Good peripheral circulation. Respiratory: Normal respiratory effort.  No retractions.  Bibasilar Rales Gastrointestinal: Soft and nontender. No distention.  No CVA tenderness. Genitourinary: deferred Musculoskeletal: Thoracic and lumbar back  pain, mild vertebral tenderness at approximately T12-L1, normal strength and sensation in the lower extremities. Neurologic:  Normal speech and language. No gross focal neurologic deficits are appreciated.  Skin:  Skin is warm, dry and intact.  Bruising to the left elbow Psychiatric: Mood and affect are normal. Speech and behavior are normal.  ____________________________________________   LABS (all labs ordered are listed, but only abnormal results are displayed)  Labs Reviewed  CBC - Abnormal; Notable for the following components:      Result Value   WBC 14.1 (*)    RBC 3.75 (*)    HCT 37.8 (*)    MCV 100.8 (*)    MCH 36.5 (*)    MCHC 36.2 (*)    Platelets 102 (*)    All other components within normal limits  BASIC METABOLIC PANEL - Abnormal; Notable for the following components:   Sodium 129 (*)    Chloride 90 (*)    Glucose, Bld 107 (*)    All other components within normal limits  CK - Abnormal; Notable for the following components:   Total CK 3,230 (*)    All other components within normal limits  SARS CORONAVIRUS 2 (TAT 6-24 HRS)  ETHANOL  URINALYSIS, COMPLETE (UACMP) WITH MICROSCOPIC   ____________________________________________  EKG ED ECG REPORT I, Lavonia Drafts, the attending physician, personally viewed and interpreted this ECG.  Date: 03/11/2021  Rhythm: normal sinus rhythm QRS Axis: normal Intervals: normal ST/T Wave abnormalities: normal Narrative Interpretation: no evidence of acute  ischemia  ____________________________________________  RADIOLOGY  X-ray ____________________________________________   PROCEDURES  Procedure(s) performed: No  Procedures   Critical Care performed: No ____________________________________________   INITIAL IMPRESSION / ASSESSMENT AND PLAN / ED COURSE  Pertinent labs & imaging results that were available during my care of the patient were reviewed by me and considered in my medical decision making (see chart for details).   Patient presents after fall, and spending several days on the floor of his home.  He is acute on chronic back pain, difficult to determine whether this is atypical for him.  He denies fevers or chills.  Does admit to drinking when he fell.  Denies head injury.  States that he did not get up because he knows not to push it when he injures his back.  Will check CK, labs, lumbar and thoracic x-rays, chest x-ray as the patient has significant cough  CK is mildly elevated, treating with IV fluids.  X-ray demonstrates new compression fracture and worsening of T11 compression fracture.  Will discuss with the hospitalist for admission      ____________________________________________   FINAL CLINICAL IMPRESSION(S) / ED DIAGNOSES  Final diagnoses:  Fall, initial encounter  Compression fracture of body of thoracic vertebra ([place])  Compression fracture of L2 vertebra, initial encounter [facility])        Note:  This document was prepared using Dragon voice recognition software and may include unintentional dictation errors.    Lavonia Drafts, MD 03/11/21 [phone]

## 2021-03-11 NOTE — ED Notes (Signed)
Pt transported to xray 

## 2021-03-11 NOTE — Plan of Care (Signed)

## 2021-03-11 NOTE — ED Triage Notes (Addendum)
Pt to ED via [facility] from home. Per EMS pt had mechanical fall on Friday from tripping over the dog and stated pt had been in the floor  x3 days. Pt c/o increased pain in lower back. CBG 110. RR 30. ST 120s. BP 160/113 with repeat 144/96. Pt given 448ml fluid in route. Pt denies LOC, HA, dizziness or hitting his head. Pt A&Ox3.

## 2021-03-12 ENCOUNTER — Inpatient Hospital Stay: Payer: Medicare Other

## 2021-03-12 DIAGNOSIS — E871 Hypo-osmolality and hyponatremia: Secondary | ICD-10-CM

## 2021-03-12 DIAGNOSIS — T796XXS Traumatic ischemia of muscle, sequela: Secondary | ICD-10-CM | POA: Diagnosis not present

## 2021-03-12 DIAGNOSIS — S32020S Wedge compression fracture of second lumbar vertebra, sequela: Secondary | ICD-10-CM | POA: Diagnosis not present

## 2021-03-12 DIAGNOSIS — S22080A Wedge compression fracture of T11-T12 vertebra, initial encounter for closed fracture: Secondary | ICD-10-CM

## 2021-03-12 DIAGNOSIS — F1010 Alcohol abuse, uncomplicated: Secondary | ICD-10-CM

## 2021-03-12 DIAGNOSIS — S22080S Wedge compression fracture of T11-T12 vertebra, sequela: Secondary | ICD-10-CM | POA: Diagnosis not present

## 2021-03-12 DIAGNOSIS — E876 Hypokalemia: Secondary | ICD-10-CM

## 2021-03-12 LAB — BASIC METABOLIC PANEL
Anion gap: 9 (ref 5–15)
BUN: 22 mg/dL (ref 8–23)
CO2: 28 mmol/L (ref 22–32)
Calcium: 9 mg/dL (ref 8.9–10.3)
Chloride: 95 mmol/L — ABNORMAL LOW (ref 98–111)
Creatinine, Ser: 0.83 mg/dL (ref 0.61–1.24)
GFR, Estimated: >60 mL/min (ref >60)
Glucose, Bld: 103 mg/dL — ABNORMAL HIGH (ref 70–99)
Potassium: 3.2 mmol/L — ABNORMAL LOW (ref 3.5–5.1)
Sodium: 132 mmol/L — ABNORMAL LOW (ref 135–145)

## 2021-03-12 LAB — CBC
HCT: 35.9 % — ABNORMAL LOW (ref 39.0–52.0)
Hemoglobin: 13.1 g/dL (ref 13.0–17.0)
MCH: 36.4 pg — ABNORMAL HIGH (ref 26.0–34.0)
MCHC: 36.5 g/dL — ABNORMAL HIGH (ref 30.0–36.0)
MCV: 99.7 fL (ref 80.0–100.0)
Platelets: 91 10*3/uL — ABNORMAL LOW (ref 150–400)
RBC: 3.6 MIL/uL — ABNORMAL LOW (ref 4.22–5.81)
RDW: 11.9 % (ref 11.5–15.5)
WBC: 9.9 10*3/uL (ref 4.0–10.5)
nRBC: 0 % (ref 0.0–0.2)

## 2021-03-12 LAB — CK: Total CK: 1551 U/L — ABNORMAL HIGH (ref 49–397)

## 2021-03-12 LAB — HIV ANTIBODY (ROUTINE TESTING W REFLEX): HIV Screen 4th Generation wRfx: NONREACTIVE

## 2021-03-12 MED ORDER — POTASSIUM CHLORIDE IN NACL 20-0.9 MEQ/L-% IV SOLN
INTRAVENOUS | Status: DC
  Filled 2021-03-12 (×7): qty 1000

## 2021-03-12 MED ORDER — CEFAZOLIN SODIUM-DEXTROSE 2-4 GM/100ML-% IV SOLN
2.0000 g | INTRAVENOUS | Status: AC
  Administered 2021-03-13: 2 g via INTRAVENOUS
  Filled 2021-03-12: qty 100

## 2021-03-12 MED ORDER — ALBUTEROL SULFATE (2.5 MG/3ML) 0.083% IN NEBU
3.0000 mL | INHALATION_SOLUTION | Freq: Three times a day (TID) | RESPIRATORY_TRACT | Status: DC
  Administered 2021-03-12 – 2021-03-14 (×6): 3 mL via RESPIRATORY_TRACT
  Filled 2021-03-12 (×6): qty 3

## 2021-03-12 MED ORDER — CALCIUM CARBONATE 1250 (500 CA) MG PO TABS
1.0000 | ORAL_TABLET | Freq: Three times a day (TID) | ORAL | Status: DC
  Administered 2021-03-12 – 2021-03-19 (×19): 500 mg via ORAL
  Filled 2021-03-12 (×22): qty 1

## 2021-03-12 MED ORDER — MAGNESIUM SULFATE 2 GM/50ML IV SOLN
2.0000 g | Freq: Once | INTRAVENOUS | Status: AC
  Administered 2021-03-12: 2 g via INTRAVENOUS
  Filled 2021-03-12: qty 50

## 2021-03-12 NOTE — Progress Notes (Signed)
MRI T and L spine ordered with multiple fractures present and potenital refracture and further compression of T 11.

## 2021-03-12 NOTE — Progress Notes (Signed)
Patient ID: Mark Stevenson, male   DOB: [birth year], 65 y.o.   MRN: [id number] [facility] PROGRESS NOTE  MAN EFFERTZ YDX:[id number] DOB: 1956-05-04 DOA: 03/11/2021 PCP: Alroy Dust, L.Marlou Sa, MD  HPI/Subjective: Patient stated he had a fall on Friday evening and could not get up.  He has not drank any alcohol since Friday evening.  One of his neighbors found him on the floor and brought him into the hospital.  He is having some back pain.  He was found to have a compression fractures and rhabdomyolysis here in the hospital.  Objective: Vitals:   03/12/21 0803 03/12/21 1202  BP: 121/86 127/86  Pulse: 87 88  Resp: 18 16  Temp: 98 F (36.7 C) 98 F (36.7 C)  SpO2: 98% 97%    Intake/Output Summary (Last 24 hours) at 03/12/2021 1236 Last data filed at 03/12/2021 0309 Gross per 24 hour  Intake 1054.51 ml  Output 125 ml  Net 929.51 ml   Filed Weights   03/11/21 1321  Weight: 77.1 kg    ROS: Review of Systems  Respiratory:  Negative for cough and shortness of breath.   Cardiovascular:  Negative for chest pain.  Gastrointestinal:  Negative for abdominal pain, nausea and vomiting.  Musculoskeletal:  Positive for back pain.  Exam: Physical Exam HENT:     Head: Normocephalic.     Mouth/Throat:     Pharynx: No oropharyngeal exudate.  Eyes:     General: Lids are normal.     Conjunctiva/sclera: Conjunctivae normal.  Cardiovascular:     Rate and Rhythm: Normal rate and regular rhythm.     Heart sounds: Normal heart sounds, S1 normal and S2 normal.  Pulmonary:     Breath sounds: Examination of the right-lower field reveals wheezing. Examination of the left-lower field reveals wheezing. Wheezing present. No decreased breath sounds, rhonchi or rales.  Abdominal:     Palpations: Abdomen is soft.     Tenderness: There is no abdominal tenderness.  Musculoskeletal:     Right ankle: No swelling.     Left ankle: No swelling.  Skin:    General: Skin is warm.     Findings: No  rash.  Neurological:     Mental Status: He is alert and oriented to person, place, and time.     Comments: No tremor.     Data Reviewed: Basic Metabolic Panel: Recent Labs  Lab 03/11/21 1332 03/12/21 0344  NA 129* 132*  K 3.6 3.2*  CL 90* 95*  CO2 27 28  GLUCOSE 107* 103*  BUN 23 22  CREATININE 0.96 0.83  CALCIUM 9.3 9.0  MG 1.7  --   PHOS 3.7  --     CBC: Recent Labs  Lab 03/11/21 1332 03/12/21 0344  WBC 14.1* 9.9  HGB 13.7 13.1  HCT 37.8* 35.9*  MCV 100.8* 99.7  PLT 102* 91*   Cardiac Enzymes: Recent Labs  Lab 03/11/21 1332 03/12/21 0344  CKTOTAL 3,230* 1,551*     Recent Results (from the past 240 hour(s))  SARS CORONAVIRUS 2 (TAT 6-24 HRS) Nasopharyngeal     Status: None   Collection Time: 03/11/21  3:13 PM   Specimen: Nasopharyngeal  Result Value Ref Range Status   SARS Coronavirus 2 NEGATIVE NEGATIVE Final    Comment: (NOTE) SARS-CoV-2 target nucleic acids are NOT DETECTED.  The SARS-CoV-2 RNA is generally detectable in upper and lower respiratory specimens during the acute phase of infection. Negative results do not preclude SARS-CoV-2 infection, do not rule  out co-infections with other pathogens, and should not be used as the sole basis for treatment or other patient management decisions. Negative results must be combined with clinical observations, patient history, and epidemiological information. The expected result is Negative.  Fact Sheet for Patients: SugarRoll.be  Fact Sheet for Healthcare Providers: https://www.woods-mathews.com/  This test is not yet approved or cleared by the [place] FDA and  has been authorized for detection and/or diagnosis of SARS-CoV-2 by FDA under an Emergency Use Authorization (EUA). This EUA will remain  in effect (meaning this test can be used) for the duration of the COVID-19 declaration under Se ction 564(b)(1) of the Act, 21 U.S.C. section 360bbb-3(b)(1),  unless the authorization is terminated or revoked sooner.  Performed at [facility], [place] [address], [place], [place] [address]      Studies: DG Chest 2 View  Result Date: 03/11/2021 CLINICAL DATA:  Back pain after recent fall. EXAM: CHEST - 2 VIEW COMPARISON:  Chest x-ray dated August 27, 2017. FINDINGS: The heart size and mediastinal contours are within normal limits. Normal pulmonary vascularity. No focal consolidation, pleural effusion, or pneumothorax. Bilateral nipple shadows again noted. Acute on chronic T11 compression fracture as described on concurrent thoracic spine x-rays from same day. IMPRESSION: 1. No acute cardiopulmonary disease. 2. Acute on chronic T11 compression fracture as described on concurrent thoracic spine x-rays from same day. Electronically Signed   By: Titus Dubin M.D.   On: 03/11/2021 15:11   DG Thoracic Spine 2 View  Result Date: 03/11/2021 CLINICAL DATA:  Back pain after fall. EXAM: THORACIC SPINE 2 VIEWS COMPARISON:  Lumbar spine x-rays dated October 14, 2019. Chest x-ray dated August 27, 2017. FINDINGS: Progressive now severe T11 vertebral body height loss since prior study. Remaining thoracic vertebral body heights are preserved. Alignment is normal. Disc spaces are relatively maintained. IMPRESSION: 1. Acute on chronic T11 compression fracture, with progressive now severe height loss compared to the prior study. Electronically Signed   By: Titus Dubin M.D.   On: 03/11/2021 15:09   DG Lumbar Spine 2-3 Views  Result Date: 03/11/2021 CLINICAL DATA:  Low back pain after fall. EXAM: LUMBAR SPINE - 2-3 VIEW COMPARISON:  Lumbar spine x-rays dated October 14, 2019. FINDINGS: New mild L2 superior endplate compression fracture. Chronic mild L1 superior endplate compression fracture. Normal alignment. Unchanged mild multilevel lumbar spondylosis. Osteopenia. Prior left total hip arthroplasty. IMPRESSION: 1. New mild L2 superior endplate compression  fracture, likely acute given clinical history. Correlate with point tenderness. 2. Chronic mild L1 compression fracture. Electronically Signed   By: Titus Dubin M.D.   On: 03/11/2021 15:06   MR THORACIC SPINE WO CONTRAST  Result Date: 03/12/2021 CLINICAL DATA:  Spinal fracture, lumbosacral, traumatic. EXAM: MRI THORACIC AND LUMBAR SPINE WITHOUT CONTRAST TECHNIQUE: Multiplanar and multiecho pulse sequences of the thoracic and lumbar spine were obtained without intravenous contrast. COMPARISON:  Radiographs March 11, 2021. FINDINGS: MRI THORACIC SPINE FINDINGS Alignment:  Exaggerated thoracic kyphosis. Vertebrae: T11 vertebral fracture with loss of approximately 80% of vertebral body height centrally, progressed from radiographs performed in January 2021. Marrow edema is consistent with acute/subacute process. No retropulsion. No evidence of discitis or aggressive bone lesion. Cord:  Normal signal and morphology. Paraspinal and other soft tissues: Negative. Disc levels: Small posterior disc protrusion at T5-6, T6-7, T7-8, T8-9, T10-11 and T11-12. Mild facet degenerative changes at T9-10, T10-11 and T11-12. No significant spinal canal or neural foraminal stenosis at any level. MRI LUMBAR SPINE FINDINGS Segmentation:  Standard. Alignment: Small anterolisthesis of T12 over L1. Small retrolisthesis of L1 over L2. Mild dextroconvex scoliosis of the lumbar spine. Vertebrae: Compression fracture of the superior endplate of L2 resulting in approximately 35% loss of vertebral body height. There is associated marrow edema, consistent with acute/subacute process. No retropulsion. No evidence of discitis or aggressive bone lesion. Mild chronic compression fracture of L1 with associated Schmorl node. Postsurgical changes from L3-4 and L4-5 laminectomy. Conus medullaris and cauda equina: Conus extends to the L1 level. Conus and cauda equina appear normal. Paraspinal and other soft tissues: A 2.7 cm right adrenal lesion with  fatty content, may represent a myelolipoma. Disc levels: T12-L1: Shallow disc bulge and mild facet degenerative changes. No spinal canal or neural foraminal stenosis. L1-2: Disc bulge, mild facet degenerative changes and ligamentum flavum redundancy resulting in mild right and moderate left neural foraminal narrowing. No spinal canal stenosis. L2-3: Disc bulge, hypertrophic facet degenerative changes and ligamentum flavum redundancy resulting in mild narrowing of the bilateral subarticular zones, mild right and mild-to-moderate left neural foraminal narrowing. L3-4: Disc bulge, hypertrophic facet degenerative changes resulting in narrowing of the bilateral subarticular zones, mild right and severe left neural foraminal narrowing. L4-5: Disc bulge, hypertrophic facet degenerative changes resulting in moderate bilateral neural foraminal narrowing, right greater than left. No spinal canal stenosis. L5-S1: Disc bulge, hypertrophic facet degenerative changes without significant spinal canal or neural foraminal stenosis. IMPRESSION: 1. T11 compression fracture with associated marrow edema, consistent with acute/subacute on chronic process. There is loss of approximately 80% of vertebral body height centrally. No retropulsion. 2. Acute/subacute compression fracture of the superior endplate of L2 with approximately 30% loss of vertebral body height. No retropulsion. 3. Degenerative changes of the thoracic spine without significant spinal canal or neural foraminal stenosis at any level. 4. Degenerative changes of the lumbar spine with multilevel neural foraminal narrowing, severe on the left at L3-4, moderate on the left at L1-2 and moderate bilaterally at L4-5. 5. 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 Electronically Signed   By: Pedro Earls M.D.   On: 03/12/2021 11:04   MR LUMBAR SPINE WO CONTRAST  Result Date: 03/12/2021 CLINICAL DATA:  Spinal fracture,  lumbosacral, traumatic. EXAM: MRI THORACIC AND LUMBAR SPINE WITHOUT CONTRAST TECHNIQUE: Multiplanar and multiecho pulse sequences of the thoracic and lumbar spine were obtained without intravenous contrast. COMPARISON:  Radiographs March 11, 2021. FINDINGS: MRI THORACIC SPINE FINDINGS Alignment:  Exaggerated thoracic kyphosis. Vertebrae: T11 vertebral fracture with loss of approximately 80% of vertebral body height centrally, progressed from radiographs performed in January 2021. Marrow edema is consistent with acute/subacute process. No retropulsion. No evidence of discitis or aggressive bone lesion. Cord:  Normal signal and morphology. Paraspinal and other soft tissues: Negative. Disc levels: Small posterior disc protrusion at T5-6, T6-7, T7-8, T8-9, T10-11 and T11-12. Mild facet degenerative changes at T9-10, T10-11 and T11-12. No significant spinal canal or neural foraminal stenosis at any level. MRI LUMBAR SPINE FINDINGS Segmentation:  Standard. Alignment: Small anterolisthesis of T12 over L1. Small retrolisthesis of L1 over L2. Mild dextroconvex scoliosis of the lumbar spine. Vertebrae: Compression fracture of the superior endplate of L2 resulting in approximately 35% loss of vertebral body height. There is associated marrow edema, consistent with acute/subacute process. No retropulsion. No evidence of discitis or aggressive bone lesion. Mild chronic compression fracture of L1 with associated Schmorl node. Postsurgical changes from L3-4 and L4-5 laminectomy. Conus medullaris and cauda equina: Conus extends  to the L1 level. Conus and cauda equina appear normal. Paraspinal and other soft tissues: A 2.7 cm right adrenal lesion with fatty content, may represent a myelolipoma. Disc levels: T12-L1: Shallow disc bulge and mild facet degenerative changes. No spinal canal or neural foraminal stenosis. L1-2: Disc bulge, mild facet degenerative changes and ligamentum flavum redundancy resulting in mild right and moderate  left neural foraminal narrowing. No spinal canal stenosis. L2-3: Disc bulge, hypertrophic facet degenerative changes and ligamentum flavum redundancy resulting in mild narrowing of the bilateral subarticular zones, mild right and mild-to-moderate left neural foraminal narrowing. L3-4: Disc bulge, hypertrophic facet degenerative changes resulting in narrowing of the bilateral subarticular zones, mild right and severe left neural foraminal narrowing. L4-5: Disc bulge, hypertrophic facet degenerative changes resulting in moderate bilateral neural foraminal narrowing, right greater than left. No spinal canal stenosis. L5-S1: Disc bulge, hypertrophic facet degenerative changes without significant spinal canal or neural foraminal stenosis. IMPRESSION: 1. T11 compression fracture with associated marrow edema, consistent with acute/subacute on chronic process. There is loss of approximately 80% of vertebral body height centrally. No retropulsion. 2. Acute/subacute compression fracture of the superior endplate of L2 with approximately 30% loss of vertebral body height. No retropulsion. 3. Degenerative changes of the thoracic spine without significant spinal canal or neural foraminal stenosis at any level. 4. Degenerative changes of the lumbar spine with multilevel neural foraminal narrowing, severe on the left at L3-4, moderate on the left at L1-2 and moderate bilaterally at L4-5. 5. 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 Electronically Signed   By: Pedro Earls M.D.   On: 03/12/2021 11:04    Scheduled Meds:  calcium carbonate  1 tablet Oral TID WC   cholecalciferol  5,000 Units Oral Daily   folic acid  1 mg Oral Daily   LORazepam  0-4 mg Oral Q6H   Followed by   Derrill Memo ON 03/13/2021] LORazepam  0-4 mg Oral Q12H   losartan  100 mg Oral Daily   mometasone-formoterol  2 puff Inhalation BID   multivitamin with minerals  1 tablet Oral Daily   nicotine  21  mg Transdermal Daily   thiamine  100 mg Oral Daily   Or   thiamine  100 mg Intravenous Daily   Continuous Infusions:  0.9 % NaCl with KCl 20 mEq / L     magnesium sulfate bolus IVPB     Brief history.  Patient admitted 03/11/2021 with a fall and being on the floor since Friday.  Patient was found to have rhabdomyolysis and 2 compression fractures.  Past medical history of alcohol abuse, thrombocytopenia, COPD.  Assessment/Plan:  Acute rhabdomyolysis.  Change IV fluids to normal saline with KCl.  CPK trending better from 3230 down to 1551.  Continue to hold pravastatin.  Likely from fall and being on the ground for a few days. Acute on chronic compression fracture T11 and compression fracture L2.  MRI reviewed and T11 has 80% vertebral body height loss.  Dr. Rudene Christians from orthopedic surgery will discuss next steps of plans with the patient.  Physical therapy evaluation. Hypomagnesemia and hypokalemia.  Replace magnesium 2 g IV x1 today and add potassium to IV fluids.  Recheck electrolytes tomorrow morning. Hyponatremia likely secondary to alcohol.  Sodium improved from 129 up to 132. Alcohol abuse on alcohol withdrawal protocol.  Thiamine. Thrombocytopenia secondary to chronic alcohol use.  Check hepatitis C. COPD with slight wheeze.  Continue albuterol nebulizer and Dulera.  Essential hypertension on Cozaar      Code Status:     Code Status Orders  (From admission, onward)           Start     Ordered   03/11/21 1630  Full code  Continuous        03/11/21 1631           Code Status History     Date Active Date Inactive Code Status Order ID Comments User Context   03/19/2017 1246 03/20/2017 1547 Full Code [id number]  Susa Day, MD Inpatient   10/22/2016 1149 10/23/2016 1624 Full Code [id number]  Susa Day, MD Inpatient   04/22/2016 1510 04/22/2016 2212 Full Code [id number]  Serafina Mitchell, MD Inpatient      Family Communication: Spoke with sister on the  phone. Disposition Plan: Status is: Inpatient  Dispo: The patient is from: Home              Anticipated d/c is to: Home versus rehab depending on clinical course.              Patient currently being treated for rhabdomyolysis.  Dr. Rudene Christians following for compression fractures.   Difficult to place patient.  Hopefully not  Consultants: Orthopedic surgery  Time spent: 28 minutes  [place]

## 2021-03-12 NOTE — Consult Note (Signed)
Reason for Consult: Multiple compression fractures Referring Physician: Dr. Lillette Boxer is an 65 y.o. male.  HPI: Patient suffered a fall at home and was on the ground for the fast few days came in with rhabdomyolysis and was found to have compression fractures as well.  He has multiple other medical problems related to this injury and chronically.  He has had prior back surgery in [facility] with Dr. Tonita Cong.  Past Medical History:  Diagnosis Date  . Arthritis   . Cancer (HCC)    skin cancer bil knees  . COPD (chronic obstructive pulmonary disease) ([place])   . Dyspnea    increased exertion; pt states can walk a flight of stairs w/o having to stop to catch breath   . Hypertension   . Nicotine dependence, cigarettes, uncomplicated   . Peripheral neuropathy   . Pure hypercholesterolemia   . Sebaceous cyst    scrotal area   . Viral wart     Past Surgical History:  Procedure Laterality Date  . ankles     tarsal tunnel surgery bilat  . BACK SURGERY  2008 and 10/22/16   herniated disc  . broken knee cap  2002  . calf surgery Bilateral    [place] the tendons  . COLONOSCOPY    . crushed L-4 vertebrae  1980  . KNEE ARTHROSCOPY     x3 bilat/ 2 times left knee  . LEG SKIN LESION  BIOPSY / EXCISION     Bil knees  . LUMBAR LAMINECTOMY/DECOMPRESSION MICRODISCECTOMY N/A 10/22/2016   Procedure: MICRO LUMBAR DECOMPRESSION L3-L4, L4-L5 REVISION  2 LEVELS;  Surgeon: Susa Day, MD;  Location: [facility];  Service: Orthopedics;  Laterality: N/A;  Requests 150 mins  . PERIPHERAL VASCULAR CATHETERIZATION N/A 04/22/2016   Procedure: Abdominal Aortogram w/ bilateral Lower Extremity Runoff;  Surgeon: Serafina Mitchell, MD;  Location: [facility];  Service: Cardiovascular;  Laterality: N/A;  . PERIPHERAL VASCULAR CATHETERIZATION Left 04/22/2016   Procedure: Peripheral Vascular Atherectomy;  Surgeon: Serafina Mitchell, MD;  Location: [facility];  Service: Cardiovascular;   Laterality: Left;  . POLYPECTOMY    . ruptured disc  2008  . SHOULDER OPEN ROTATOR CUFF REPAIR Right 03/19/2017   Procedure: Mini open rotator cuff repair;  Surgeon: Susa Day, MD;  Location: [facility];  Service: Orthopedics;  Laterality: Right;  with block  . SHOULDER SURGERY Right 2019  . tendons in calves lengthened     plantar fascitis  . TOTAL HIP ARTHROPLASTY  2012   left    Family History  Problem Relation Age of Onset  . Colon cancer Son   . Liver cancer Son   . Lung cancer Father   . Pancreatic cancer Neg Hx   . Stomach cancer Neg Hx   . Esophageal cancer Neg Hx   . Rectal cancer Neg Hx     Social History:  reports that he has been smoking cigarettes. He has a 63.00 pack-year smoking history. He has never used smokeless tobacco. He reports current alcohol use of about 10.0 standard drinks of alcohol per week. He reports that he does not use drugs.  Allergies: No Known Allergies  Medications: I have reviewed the patient's current medications.  Results for orders placed or performed during the hospital encounter of 03/11/21 (from the past 48 hour(s))  CBC     Status: Abnormal   Collection Time: 03/11/21  1:32 PM  Result Value Ref Range   WBC 14.1 (H)  4.0 - 10.5 K/uL   RBC 3.75 (L) 4.22 - 5.81 MIL/uL   Hemoglobin 13.7 13.0 - 17.0 g/dL   HCT 37.8 (L) 39.0 - 52.0 %   MCV 100.8 (H) 80.0 - 100.0 fL   MCH 36.5 (H) 26.0 - 34.0 pg   MCHC 36.2 (H) 30.0 - 36.0 g/dL   RDW 11.9 11.5 - 15.5 %   Platelets 102 (L) 150 - 400 K/uL    Comment: SPECIMEN CHECKED FOR CLOTS Immature Platelet Fraction may be clinically indicated, consider ordering this additional test [id number]    nRBC 0.0 0.0 - 0.2 %    Comment: Performed at [facility], [address], [place], [place] [address]  Basic metabolic panel     Status: Abnormal   Collection Time: 03/11/21  1:32 PM  Result Value Ref Range   Sodium 129 (L) 135 - 145 mmol/L   Potassium 3.6 3.5 - 5.1 mmol/L   Chloride 90 (L)  98 - 111 mmol/L   CO2 27 22 - 32 mmol/L   Glucose, Bld 107 (H) 70 - 99 mg/dL    Comment: Glucose reference range applies only to samples taken after fasting for at least 8 hours.   BUN 23 8 - 23 mg/dL   Creatinine, Ser 0.96 0.61 - 1.24 mg/dL   Calcium 9.3 8.9 - 10.3 mg/dL   GFR, Estimated >60 >60 mL/min    Comment: (NOTE) Calculated using the CKD-EPI Creatinine Equation (2021)    Anion gap 12 5 - 15    Comment: Performed at [facility], [address] [place], [place] [address]  CK     Status: Abnormal   Collection Time: 03/11/21  1:32 PM  Result Value Ref Range   Total CK 3,230 (H) 49 - 397 U/L    Comment: Performed at [facility], [address] [place], [place] [address]  Magnesium     Status: None   Collection Time: 03/11/21  1:32 PM  Result Value Ref Range   Magnesium 1.7 1.7 - 2.4 mg/dL    Comment: Performed at [facility], [address] [place], [place] [address]  Phosphorus     Status: None   Collection Time: 03/11/21  1:32 PM  Result Value Ref Range   Phosphorus 3.7 2.5 - 4.6 mg/dL    Comment: Performed at [facility], [address] [place], [place] [address]  Ethanol     Status: None   Collection Time: 03/11/21  1:40 PM  Result Value Ref Range   Alcohol, Ethyl (B) <10 <10 mg/dL    Comment: (NOTE) Lowest detectable limit for serum alcohol is 10 mg/dL.  For medical purposes only. Performed at [facility], [address] [place], [place] [address]   Urinalysis, Complete w Microscopic     Status: Abnormal   Collection Time: 03/11/21  2:15 PM  Result Value Ref Range   Color, Urine AMBER (A) YELLOW    Comment: BIOCHEMICALS MAY BE AFFECTED BY COLOR   APPearance CLEAR (A) CLEAR   Specific Gravity, Urine 1.017 1.005 - 1.030   pH 5.0 5.0 - 8.0   Glucose, UA NEGATIVE NEGATIVE mg/dL   Hgb urine dipstick SMALL (A) NEGATIVE   Bilirubin Urine NEGATIVE NEGATIVE   Ketones, ur 20 (A) NEGATIVE mg/dL   Protein, ur  30 (A) NEGATIVE mg/dL   Nitrite NEGATIVE NEGATIVE   Leukocytes,Ua NEGATIVE NEGATIVE   RBC / HPF 0-5 0 - 5 RBC/hpf   WBC, UA 0-5  0 - 5 WBC/hpf   Bacteria, UA NONE SEEN NONE SEEN   Squamous Epithelial / LPF 0-5 0 - 5   Mucus PRESENT    Hyaline Casts, UA PRESENT     Comment: Performed at [facility], [address] [place], [place] [address]  SARS CORONAVIRUS 2 (TAT 6-24 HRS) Nasopharyngeal     Status: None   Collection Time: 03/11/21  3:13 PM   Specimen: Nasopharyngeal  Result Value Ref Range   SARS Coronavirus 2 NEGATIVE NEGATIVE    Comment: (NOTE) SARS-CoV-2 target nucleic acids are NOT DETECTED.  The SARS-CoV-2 RNA is generally detectable in upper and lower respiratory specimens during the acute phase of infection. Negative results do not preclude SARS-CoV-2 infection, do not rule out co-infections with other pathogens, and should not be used as the sole basis for treatment or other patient management decisions. Negative results must be combined with clinical observations, patient history, and epidemiological information. The expected result is Negative.  Fact Sheet for Patients: SugarRoll.be  Fact Sheet for Healthcare Providers: https://www.woods-mathews.com/  This test is not yet approved or cleared by the [place] FDA and  has been authorized for detection and/or diagnosis of SARS-CoV-2 by FDA under an Emergency Use Authorization (EUA). This EUA will remain  in effect (meaning this test can be used) for the duration of the COVID-19 declaration under Se ction 564(b)(1) of the Act, 21 U.S.C. section 360bbb-3(b)(1), unless the authorization is terminated or revoked sooner.  Performed at [facility], [place] [address], [place], [place] [address]   Basic metabolic panel     Status: Abnormal   Collection Time: 03/12/21  3:44 AM  Result Value Ref Range   Sodium 132 (L) 135 - 145 mmol/L   Potassium 3.2 (L) 3.5 -  5.1 mmol/L   Chloride 95 (L) 98 - 111 mmol/L   CO2 28 22 - 32 mmol/L   Glucose, Bld 103 (H) 70 - 99 mg/dL    Comment: Glucose reference range applies only to samples taken after fasting for at least 8 hours.   BUN 22 8 - 23 mg/dL   Creatinine, Ser 0.83 0.61 - 1.24 mg/dL   Calcium 9.0 8.9 - 10.3 mg/dL   GFR, Estimated >60 >60 mL/min    Comment: (NOTE) Calculated using the CKD-EPI Creatinine Equation (2021)    Anion gap 9 5 - 15    Comment: Performed at [facility], [address] [place], [place] [address]  CBC     Status: Abnormal   Collection Time: 03/12/21  3:44 AM  Result Value Ref Range   WBC 9.9 4.0 - 10.5 K/uL   RBC 3.60 (L) 4.22 - 5.81 MIL/uL   Hemoglobin 13.1 13.0 - 17.0 g/dL   HCT 35.9 (L) 39.0 - 52.0 %   MCV 99.7 80.0 - 100.0 fL   MCH 36.4 (H) 26.0 - 34.0 pg   MCHC 36.5 (H) 30.0 - 36.0 g/dL   RDW 11.9 11.5 - 15.5 %   Platelets 91 (L) 150 - 400 K/uL    Comment: Immature Platelet Fraction may be clinically indicated, consider ordering this additional test [id number]    nRBC 0.0 0.0 - 0.2 %    Comment: Performed at [facility], [address] [place], [place] [address]  HIV Antibody (routine testing w rflx)     Status: None   Collection Time: 03/12/21  3:44 AM  Result Value Ref Range   HIV Screen 4th Generation wRfx Non Reactive  Non Reactive    Comment: Performed at [facility], [place] [address] [place], [place] [address]  CK     Status: Abnormal   Collection Time: 03/12/21  3:44 AM  Result Value Ref Range   Total CK 1,551 (H) 49 - 397 U/L    Comment: Performed at [facility], [address] [place], [place] [address]    DG Chest 2 View  Result Date: 03/11/2021 CLINICAL DATA:  Back pain after recent fall. EXAM: CHEST - 2 VIEW COMPARISON:  Chest x-ray dated August 27, 2017. FINDINGS: The heart size and mediastinal contours are within normal limits. Normal pulmonary vascularity. No focal consolidation, pleural effusion, or  pneumothorax. Bilateral nipple shadows again noted. Acute on chronic T11 compression fracture as described on concurrent thoracic spine x-rays from same day. IMPRESSION: 1. No acute cardiopulmonary disease. 2. Acute on chronic T11 compression fracture as described on concurrent thoracic spine x-rays from same day. Electronically Signed   By: Titus Dubin M.D.   On: 03/11/2021 15:11   DG Thoracic Spine 2 View  Result Date: 03/11/2021 CLINICAL DATA:  Back pain after fall. EXAM: THORACIC SPINE 2 VIEWS COMPARISON:  Lumbar spine x-rays dated October 14, 2019. Chest x-ray dated August 27, 2017. FINDINGS: Progressive now severe T11 vertebral body height loss since prior study. Remaining thoracic vertebral body heights are preserved. Alignment is normal. Disc spaces are relatively maintained. IMPRESSION: 1. Acute on chronic T11 compression fracture, with progressive now severe height loss compared to the prior study. Electronically Signed   By: Titus Dubin M.D.   On: 03/11/2021 15:09   DG Lumbar Spine 2-3 Views  Result Date: 03/11/2021 CLINICAL DATA:  Low back pain after fall. EXAM: LUMBAR SPINE - 2-3 VIEW COMPARISON:  Lumbar spine x-rays dated October 14, 2019. FINDINGS: New mild L2 superior endplate compression fracture. Chronic mild L1 superior endplate compression fracture. Normal alignment. Unchanged mild multilevel lumbar spondylosis. Osteopenia. Prior left total hip arthroplasty. IMPRESSION: 1. New mild L2 superior endplate compression fracture, likely acute given clinical history. Correlate with point tenderness. 2. Chronic mild L1 compression fracture. Electronically Signed   By: Titus Dubin M.D.   On: 03/11/2021 15:06   MR THORACIC SPINE WO CONTRAST  Result Date: 03/12/2021 CLINICAL DATA:  Spinal fracture, lumbosacral, traumatic. EXAM: MRI THORACIC AND LUMBAR SPINE WITHOUT CONTRAST TECHNIQUE: Multiplanar and multiecho pulse sequences of the thoracic and lumbar spine were obtained without  intravenous contrast. COMPARISON:  Radiographs March 11, 2021. FINDINGS: MRI THORACIC SPINE FINDINGS Alignment:  Exaggerated thoracic kyphosis. Vertebrae: T11 vertebral fracture with loss of approximately 80% of vertebral body height centrally, progressed from radiographs performed in January 2021. Marrow edema is consistent with acute/subacute process. No retropulsion. No evidence of discitis or aggressive bone lesion. Cord:  Normal signal and morphology. Paraspinal and other soft tissues: Negative. Disc levels: Small posterior disc protrusion at T5-6, T6-7, T7-8, T8-9, T10-11 and T11-12. Mild facet degenerative changes at T9-10, T10-11 and T11-12. No significant spinal canal or neural foraminal stenosis at any level. MRI LUMBAR SPINE FINDINGS Segmentation:  Standard. Alignment: Small anterolisthesis of T12 over L1. Small retrolisthesis of L1 over L2. Mild dextroconvex scoliosis of the lumbar spine. Vertebrae: Compression fracture of the superior endplate of L2 resulting in approximately 35% loss of vertebral body height. There is associated marrow edema, consistent with acute/subacute process. No retropulsion. No evidence of discitis or aggressive bone lesion. Mild chronic compression fracture of L1 with associated Schmorl node. Postsurgical changes from L3-4 and L4-5  laminectomy. Conus medullaris and cauda equina: Conus extends to the L1 level. Conus and cauda equina appear normal. Paraspinal and other soft tissues: A 2.7 cm right adrenal lesion with fatty content, may represent a myelolipoma. Disc levels: T12-L1: Shallow disc bulge and mild facet degenerative changes. No spinal canal or neural foraminal stenosis. L1-2: Disc bulge, mild facet degenerative changes and ligamentum flavum redundancy resulting in mild right and moderate left neural foraminal narrowing. No spinal canal stenosis. L2-3: Disc bulge, hypertrophic facet degenerative changes and ligamentum flavum redundancy resulting in mild narrowing of the  bilateral subarticular zones, mild right and mild-to-moderate left neural foraminal narrowing. L3-4: Disc bulge, hypertrophic facet degenerative changes resulting in narrowing of the bilateral subarticular zones, mild right and severe left neural foraminal narrowing. L4-5: Disc bulge, hypertrophic facet degenerative changes resulting in moderate bilateral neural foraminal narrowing, right greater than left. No spinal canal stenosis. L5-S1: Disc bulge, hypertrophic facet degenerative changes without significant spinal canal or neural foraminal stenosis. IMPRESSION: 1. T11 compression fracture with associated marrow edema, consistent with acute/subacute on chronic process. There is loss of approximately 80% of vertebral body height centrally. No retropulsion. 2. Acute/subacute compression fracture of the superior endplate of L2 with approximately 30% loss of vertebral body height. No retropulsion. 3. Degenerative changes of the thoracic spine without significant spinal canal or neural foraminal stenosis at any level. 4. Degenerative changes of the lumbar spine with multilevel neural foraminal narrowing, severe on the left at L3-4, moderate on the left at L1-2 and moderate bilaterally at L4-5. 5. 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 Electronically Signed   By: Pedro Earls M.D.   On: 03/12/2021 11:04   MR LUMBAR SPINE WO CONTRAST  Result Date: 03/12/2021 CLINICAL DATA:  Spinal fracture, lumbosacral, traumatic. EXAM: MRI THORACIC AND LUMBAR SPINE WITHOUT CONTRAST TECHNIQUE: Multiplanar and multiecho pulse sequences of the thoracic and lumbar spine were obtained without intravenous contrast. COMPARISON:  Radiographs March 11, 2021. FINDINGS: MRI THORACIC SPINE FINDINGS Alignment:  Exaggerated thoracic kyphosis. Vertebrae: T11 vertebral fracture with loss of approximately 80% of vertebral body height centrally, progressed from radiographs performed in  January 2021. Marrow edema is consistent with acute/subacute process. No retropulsion. No evidence of discitis or aggressive bone lesion. Cord:  Normal signal and morphology. Paraspinal and other soft tissues: Negative. Disc levels: Small posterior disc protrusion at T5-6, T6-7, T7-8, T8-9, T10-11 and T11-12. Mild facet degenerative changes at T9-10, T10-11 and T11-12. No significant spinal canal or neural foraminal stenosis at any level. MRI LUMBAR SPINE FINDINGS Segmentation:  Standard. Alignment: Small anterolisthesis of T12 over L1. Small retrolisthesis of L1 over L2. Mild dextroconvex scoliosis of the lumbar spine. Vertebrae: Compression fracture of the superior endplate of L2 resulting in approximately 35% loss of vertebral body height. There is associated marrow edema, consistent with acute/subacute process. No retropulsion. No evidence of discitis or aggressive bone lesion. Mild chronic compression fracture of L1 with associated Schmorl node. Postsurgical changes from L3-4 and L4-5 laminectomy. Conus medullaris and cauda equina: Conus extends to the L1 level. Conus and cauda equina appear normal. Paraspinal and other soft tissues: A 2.7 cm right adrenal lesion with fatty content, may represent a myelolipoma. Disc levels: T12-L1: Shallow disc bulge and mild facet degenerative changes. No spinal canal or neural foraminal stenosis. L1-2: Disc bulge, mild facet degenerative changes and ligamentum flavum redundancy resulting in mild right and moderate left neural foraminal narrowing. No spinal canal stenosis. L2-3: Disc bulge,  hypertrophic facet degenerative changes and ligamentum flavum redundancy resulting in mild narrowing of the bilateral subarticular zones, mild right and mild-to-moderate left neural foraminal narrowing. L3-4: Disc bulge, hypertrophic facet degenerative changes resulting in narrowing of the bilateral subarticular zones, mild right and severe left neural foraminal narrowing. L4-5: Disc bulge,  hypertrophic facet degenerative changes resulting in moderate bilateral neural foraminal narrowing, right greater than left. No spinal canal stenosis. L5-S1: Disc bulge, hypertrophic facet degenerative changes without significant spinal canal or neural foraminal stenosis. IMPRESSION: 1. T11 compression fracture with associated marrow edema, consistent with acute/subacute on chronic process. There is loss of approximately 80% of vertebral body height centrally. No retropulsion. 2. Acute/subacute compression fracture of the superior endplate of L2 with approximately 30% loss of vertebral body height. No retropulsion. 3. Degenerative changes of the thoracic spine without significant spinal canal or neural foraminal stenosis at any level. 4. Degenerative changes of the lumbar spine with multilevel neural foraminal narrowing, severe on the left at L3-4, moderate on the left at L1-2 and moderate bilaterally at L4-5. 5. 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 Electronically Signed   By: Pedro Earls M.D.   On: 03/12/2021 11:04    Review of Systems Blood pressure 107/80, pulse (!) 108, temperature 98.1 F (36.7 C), resp. rate 19, height 5\' 11"  (1.803 m), weight 77.1 kg, SpO2 96 %. Physical Exam He is point tender with slight kyphotic deformity at T11 and L2.  Able to flex extend the toes no gross motor weakness.  No clonus Assessment/Plan: Severe pain secondary to acute to subacute T11 and L2 compression fractures limiting his ability to ambulate and care for himself.  He also has spinal stenosis present. I discussed treatment options with him and recommend kyphoplasty at the above levels and he agrees that we will plan on that tomorrow.  Hessie Knows 03/12/2021, 8:27 PM

## 2021-03-13 ENCOUNTER — Inpatient Hospital Stay: Payer: Medicare Other | Admitting: Certified Registered"

## 2021-03-13 ENCOUNTER — Encounter
Admission: EM | Disposition: A | Discharge status: Home Health | Payer: Self-pay | Source: Home / Self Care | Attending: Internal Medicine

## 2021-03-13 ENCOUNTER — Encounter: Payer: Self-pay | Admitting: Internal Medicine

## 2021-03-13 ENCOUNTER — Inpatient Hospital Stay: Payer: Medicare Other

## 2021-03-13 ENCOUNTER — Other Ambulatory Visit: Payer: Self-pay

## 2021-03-13 DIAGNOSIS — F1010 Alcohol abuse, uncomplicated: Secondary | ICD-10-CM | POA: Diagnosis not present

## 2021-03-13 DIAGNOSIS — S22080S Wedge compression fracture of T11-T12 vertebra, sequela: Secondary | ICD-10-CM | POA: Diagnosis not present

## 2021-03-13 DIAGNOSIS — E876 Hypokalemia: Secondary | ICD-10-CM | POA: Diagnosis not present

## 2021-03-13 HISTORY — PX: KYPHOPLASTY: SHX5884

## 2021-03-13 LAB — BASIC METABOLIC PANEL
Anion gap: 7 (ref 5–15)
BUN: 20 mg/dL (ref 8–23)
CO2: 28 mmol/L (ref 22–32)
Calcium: 9.3 mg/dL (ref 8.9–10.3)
Chloride: 101 mmol/L (ref 98–111)
Creatinine, Ser: 0.86 mg/dL (ref 0.61–1.24)
GFR, Estimated: >60 mL/min (ref >60)
Glucose, Bld: 116 mg/dL — ABNORMAL HIGH (ref 70–99)
Potassium: 3.1 mmol/L — ABNORMAL LOW (ref 3.5–5.1)
Sodium: 136 mmol/L (ref 135–145)

## 2021-03-13 LAB — GLUCOSE, CAPILLARY
Glucose-Capillary: 132 mg/dL — ABNORMAL HIGH (ref 70–99)
Glucose-Capillary: 130 mg/dL — ABNORMAL HIGH (ref 70–99)

## 2021-03-13 LAB — HEPATITIS C ANTIBODY: HCV Ab: NONREACTIVE

## 2021-03-13 LAB — PHOSPHORUS: Phosphorus: 3.4 mg/dL (ref 2.5–4.6)

## 2021-03-13 LAB — MAGNESIUM: Magnesium: 2.1 mg/dL (ref 1.7–2.4)

## 2021-03-13 SURGERY — KYPHOPLASTY
Anesthesia: General

## 2021-03-13 MED ORDER — MIDAZOLAM HCL 2 MG/2ML IJ SOLN
INTRAMUSCULAR | Status: AC
  Filled 2021-03-13: qty 2

## 2021-03-13 MED ORDER — PROPOFOL 10 MG/ML IV BOLUS
INTRAVENOUS | Status: DC | PRN
  Administered 2021-03-13 (×2): 50 mg via INTRAVENOUS

## 2021-03-13 MED ORDER — BUPIVACAINE-EPINEPHRINE (PF) 0.5% -1:200000 IJ SOLN
INTRAMUSCULAR | Status: AC
  Filled 2021-03-13: qty 60

## 2021-03-13 MED ORDER — BUPIVACAINE-EPINEPHRINE (PF) 0.5% -1:200000 IJ SOLN
INTRAMUSCULAR | Status: DC | PRN
  Administered 2021-03-13 (×2): 10 mL via PERINEURAL

## 2021-03-13 MED ORDER — SODIUM CHLORIDE FLUSH 0.9 % IV SOLN
INTRAVENOUS | Status: AC
  Filled 2021-03-13: qty 20

## 2021-03-13 MED ORDER — LIDOCAINE HCL 1 % IJ SOLN
INTRAMUSCULAR | Status: DC | PRN
  Administered 2021-03-13 (×3): 10 mL

## 2021-03-13 MED ORDER — POTASSIUM CHLORIDE CRYS ER 20 MEQ PO TBCR
40.0000 meq | EXTENDED_RELEASE_TABLET | Freq: Four times a day (QID) | ORAL | Status: AC
  Administered 2021-03-13 (×2): 40 meq via ORAL
  Filled 2021-03-13 (×2): qty 2

## 2021-03-13 MED ORDER — DEXAMETHASONE SODIUM PHOSPHATE 10 MG/ML IJ SOLN
INTRAMUSCULAR | Status: DC | PRN
  Administered 2021-03-13: 10 mg via INTRAVENOUS

## 2021-03-13 MED ORDER — FENTANYL CITRATE (PF) 100 MCG/2ML IJ SOLN
INTRAMUSCULAR | Status: DC | PRN
  Administered 2021-03-13 (×2): 25 ug via INTRAVENOUS
  Administered 2021-03-13: 50 ug via INTRAVENOUS

## 2021-03-13 MED ORDER — IOHEXOL 180 MG/ML  SOLN
INTRAMUSCULAR | Status: DC | PRN
  Administered 2021-03-13: 20 mL

## 2021-03-13 MED ORDER — ONDANSETRON HCL 4 MG/2ML IJ SOLN
INTRAMUSCULAR | Status: DC | PRN
  Administered 2021-03-13: 4 mg via INTRAVENOUS

## 2021-03-13 MED ORDER — PROPOFOL 500 MG/50ML IV EMUL
INTRAVENOUS | Status: DC | PRN
  Administered 2021-03-13: 25 ug/kg/min via INTRAVENOUS

## 2021-03-13 MED ORDER — ONDANSETRON HCL 4 MG/2ML IJ SOLN: 4.0000 mg | Freq: Once | INTRAMUSCULAR | Status: DC | PRN

## 2021-03-13 MED ORDER — LACTATED RINGERS IV SOLN: INTRAVENOUS | Status: DC | PRN

## 2021-03-13 MED ORDER — SENNOSIDES-DOCUSATE SODIUM 8.6-50 MG PO TABS
2.0000 | ORAL_TABLET | Freq: Two times a day (BID) | ORAL | Status: DC
  Administered 2021-03-13 – 2021-03-19 (×11): 2 via ORAL
  Filled 2021-03-13 (×12): qty 2

## 2021-03-13 MED ORDER — FENTANYL CITRATE (PF) 100 MCG/2ML IJ SOLN: 25.0000 ug | INTRAMUSCULAR | Status: DC | PRN

## 2021-03-13 MED ORDER — LIDOCAINE HCL (PF) 1 % IJ SOLN
INTRAMUSCULAR | Status: AC
  Filled 2021-03-13: qty 90

## 2021-03-13 MED ORDER — LIDOCAINE HCL (CARDIAC) PF 100 MG/5ML IV SOSY
PREFILLED_SYRINGE | INTRAVENOUS | Status: DC | PRN
  Administered 2021-03-13: 100 mg via INTRATRACHEAL

## 2021-03-13 MED ORDER — MIDAZOLAM HCL 2 MG/2ML IJ SOLN
INTRAMUSCULAR | Status: DC | PRN
  Administered 2021-03-13: 2 mg via INTRAVENOUS

## 2021-03-13 MED ORDER — CHLORDIAZEPOXIDE HCL 25 MG PO CAPS: 25.0000 mg | ORAL_CAPSULE | Freq: Three times a day (TID) | ORAL | Status: DC

## 2021-03-13 MED ORDER — DEXMEDETOMIDINE (PRECEDEX) IN NS 20 MCG/5ML (4 MCG/ML) IV SYRINGE
PREFILLED_SYRINGE | INTRAVENOUS | Status: DC | PRN
  Administered 2021-03-13: 4 ug via INTRAVENOUS
  Administered 2021-03-13 (×2): 8 ug via INTRAVENOUS

## 2021-03-13 MED ORDER — CHLORDIAZEPOXIDE HCL 10 MG PO CAPS
10.0000 mg | ORAL_CAPSULE | Freq: Three times a day (TID) | ORAL | Status: AC
  Administered 2021-03-13 (×2): 10 mg via ORAL
  Filled 2021-03-13 (×2): qty 1

## 2021-03-13 SURGICAL SUPPLY — 20 items
CEMENT KYPHON CX01A KIT/MIXER (Cement) ×2 IMPLANT
COVER WAND RF STERILE (DRAPES) ×2 IMPLANT
DERMABOND ADVANCED (GAUZE/BANDAGES/DRESSINGS) ×1
DERMABOND ADVANCED .7 DNX12 (GAUZE/BANDAGES/DRESSINGS) ×1 IMPLANT
DEVICE BIOPSY BONE KYPHX (INSTRUMENTS) ×2 IMPLANT
DRAPE C-ARM XRAY 36X54 (DRAPES) ×2 IMPLANT
DURAPREP 26ML APPLICATOR (WOUND CARE) ×2 IMPLANT
GLOVE SURG SYN 9.0  PF PI (GLOVE) ×2
GLOVE SURG SYN 9.0 PF PI (GLOVE) ×1 IMPLANT
GOWN SRG 2XL LVL 4 RGLN SLV (GOWNS) ×1 IMPLANT
GOWN STRL NON-REIN 2XL LVL4 (GOWNS) ×2
GOWN STRL REUS W/ TWL LRG LVL3 (GOWN DISPOSABLE) ×1 IMPLANT
GOWN STRL REUS W/TWL LRG LVL3 (GOWN DISPOSABLE) ×2
MANIFOLD NEPTUNE II (INSTRUMENTS) ×2 IMPLANT
PACK KYPHOPLASTY (MISCELLANEOUS) ×2 IMPLANT
RENTAL RFA GENERATOR (MISCELLANEOUS) IMPLANT
STRAP SAFETY 5IN WIDE (MISCELLANEOUS) ×2 IMPLANT
SWABSTK COMLB BENZOIN TINCTURE (MISCELLANEOUS) ×2 IMPLANT
TRAY KYPHOPAK 15/3 EXPRESS 1ST (MISCELLANEOUS) IMPLANT
TRAY KYPHOPAK 20/3 EXPRESS 1ST (MISCELLANEOUS) ×2 IMPLANT

## 2021-03-13 NOTE — Anesthesia Postprocedure Evaluation (Signed)
Anesthesia Post Note  Patient: Mark Stevenson  Procedure(s) Performed: KYPHOPLASTY - T11 and L2  Patient location during evaluation: PACU Anesthesia Type: General Level of consciousness: awake and alert Pain management: pain level controlled Vital Signs Assessment: post-procedure vital signs reviewed and stable Respiratory status: spontaneous breathing and respiratory function stable Cardiovascular status: stable Anesthetic complications: no   No notable events documented.   Last Vitals:  Vitals:   03/13/21 1310 03/13/21 1408  BP:  124/75  Pulse:  84  Resp:  18  Temp:    SpO2: 94% 97%    Last Pain:  Vitals:   03/13/21 1408  TempSrc:   PainSc: 6                  [name] K

## 2021-03-13 NOTE — Progress Notes (Signed)
PT Cancellation Note  Patient Details Name: JANICE SEALES MRN: [id number] DOB: Apr 02, 1956   Cancelled Treatment:    Reason Eval/Treat Not Completed: Other (comment). Pt currently pending kypho this date for acute compression fx. Will evaluate next date for disposition.   Narmeen Kerper 03/13/2021, 9:20 AM Greggory Stallion, PT, DPT [phone]

## 2021-03-13 NOTE — Anesthesia Preprocedure Evaluation (Addendum)
Anesthesia Evaluation  Patient identified by MRN, date of birth, ID band Patient awake    Reviewed: Allergy & Precautions, NPO status , Patient's Chart, lab work & pertinent test results  History of Anesthesia Complications Negative for: history of anesthetic complications  Airway Mallampati: II       Dental   Pulmonary neg sleep apnea, COPD, Current Smoker,           Cardiovascular hypertension, Pt. on medications (-) Past MI and (-) CHF (-) dysrhythmias (-) Valvular Problems/Murmurs     Neuro/Psych neg Seizures    GI/Hepatic Neg liver ROS, neg GERD  ,(+)     substance abuse  alcohol use,   Endo/Other  neg diabetes  Renal/GU negative Renal ROS     Musculoskeletal   Abdominal   Peds  Hematology   Anesthesia Other Findings   Reproductive/Obstetrics                            Anesthesia Physical Anesthesia Plan  ASA: 2  Anesthesia Plan: General   Post-op Pain Management:    Induction: Intravenous  PONV Risk Score and Plan: 1 and Ondansetron  Airway Management Planned: Nasal Cannula  Additional Equipment:   Intra-op Plan:   Post-operative Plan:   Informed Consent: I have reviewed the patients History and Physical, chart, labs and discussed the procedure including the risks, benefits and alternatives for the proposed anesthesia with the patient or authorized representative who has indicated his/her understanding and acceptance.       Plan Discussed with:   Anesthesia Plan Comments:         Anesthesia Quick Evaluation

## 2021-03-13 NOTE — Op Note (Signed)
03/11/2021 - 03/13/2021  3:47 PM  PATIENT:  Mark Stevenson   MRN: [id number]   PRE-OPERATIVE DIAGNOSIS:  closed wedge compression fracture of T11 and L2   POST-OPERATIVE DIAGNOSIS:  closed wedge compression fracture of T11 and L2   PROCEDURE:  Procedure(s): KYPHOPLASTY T11 and L2  SURGEON: Laurene Footman, MD   ASSISTANTS: None   ANESTHESIA:   local and MAC   EBL:  No intake/output data recorded.   BLOOD ADMINISTERED:none   DRAINS: none    LOCAL MEDICATIONS USED:  MARCAINE    and XYLOCAINE    SPECIMEN: T11 and L2 vertebral body biopsy   DISPOSITION OF SPECIMEN: Pathology   COUNTS:  YES   TOURNIQUET:  * No tourniquets in log *   IMPLANTS: Bone cement   DICTATION: .Dragon Dictation  patient was brought to the operating room and after adequate anesthesia was obtained the patient was placed prone.  C arm was brought in in good visualization of the affected level obtained on both AP and lateral projections.  After patient identification and timeout procedures were completed, local anesthetic was infiltrated with 10 cc 1% Xylocaine infiltrated subcutaneously.  This is done the area on the each side of the planned approach.  The back was then prepped and draped in the usual sterile manner and repeat timeout procedure carried out.  A spinal needle was brought down to the pedicle on the  right side of T11 and L2 and a 50-50 mix of 1% Xylocaine half percent Sensorcaine with epinephrine total of 20 cc injected on each side.  After allowing this to set a small incision was made and the trocar was advanced into the vertebral body in an extrapedicular fashion.  Biopsy was obtained at each level drilling was carried out balloon inserted with inflation to 2.0 cc on the right at T11, and 3 cc on the right at L2.  Drilling and balloon cross the midline so's left side was not addressed.  When the cement was appropriate consistency 6 cc were injected on the right at L2 without extravasation and 2.5  cc on the on the right at T11 into the vertebral body with with a small amount of extravasation into the T11-T12 disc space.  Good fill superior to inferior endplates and from right to left sides along the inferior endplate.  After the cement had set the trochar was removed and permanent C-arm views obtained.  The wound was closed with Dermabond followed by Band-Aid   PLAN OF CARE: Continue as inpatient   PATIENT DISPOSITION:  PACU - hemodynamically stable.

## 2021-03-13 NOTE — Progress Notes (Signed)
Pt has left the floor , telemetry on, pain medication given ( pre op approved via telephone)

## 2021-03-13 NOTE — Progress Notes (Signed)
Patient ID: Mark Stevenson, male   DOB: 09/06/1956, 65 y.o.   MRN: [id number] [facility] PROGRESS NOTE  JACOBI NILE PPJ:[id number] DOB: 1956/03/28 DOA: 03/11/2021 PCP: Alroy Dust, L.Marlou Sa, MD  HPI  Brief history.  Patient admitted 03/11/2021 with a fall and being on the floor since Friday.  Patient was found to have rhabdomyolysis and 2 compression fractures.  Past medical history of alcohol abuse, thrombocytopenia, COPD.  Subjective:  Patient still reports his back is sore, no nausea, no vomiting.  He does report constipation, no bowel movement since admission. Objective: Vitals:   03/13/21 0815 03/13/21 1142  BP: 122/88 111/85  Pulse: 73 89  Resp: 16 18  Temp: 98.2 F (36.8 C) 98.1 F (36.7 C)  SpO2: 93% 96%    Intake/Output Summary (Last 24 hours) at 03/13/2021 1245 Last data filed at 03/13/2021 0450 Gross per 24 hour  Intake --  Output 400 ml  Net -400 ml    Filed Weights   03/11/21 1321  Weight: 77.1 kg     Exam:  Awake Alert, Oriented X 3, No new F.N deficits, Normal affect, he is with tremors today Symmetrical Chest wall movement, Good air movement bilaterally, CTAB RRR,No Gallops,Rubs or new Murmurs, No Parasternal Heave +ve B.Sounds, Abd Soft, No tenderness, No rebound - guarding or rigidity. No Cyanosis, Clubbing or edema, No new Rash or bruise      Data Reviewed: Basic Metabolic Panel: Recent Labs  Lab 03/11/21 1332 03/12/21 0344 03/13/21 0406  NA 129* 132* 136  K 3.6 3.2* 3.1*  CL 90* 95* 101  CO2 27 28 28   GLUCOSE 107* 103* 116*  BUN 23 22 20   CREATININE 0.96 0.83 0.86  CALCIUM 9.3 9.0 9.3  MG 1.7  --  2.1  PHOS 3.7  --  3.4     CBC: Recent Labs  Lab 03/11/21 1332 03/12/21 0344  WBC 14.1* 9.9  HGB 13.7 13.1  HCT 37.8* 35.9*  MCV 100.8* 99.7  PLT 102* 91*    Cardiac Enzymes: Recent Labs  Lab 03/11/21 1332 03/12/21 0344  CKTOTAL 3,230* 1,551*      Recent Results (from the past 240 hour(s))  SARS CORONAVIRUS 2  (TAT 6-24 HRS) Nasopharyngeal     Status: None   Collection Time: 03/11/21  3:13 PM   Specimen: Nasopharyngeal  Result Value Ref Range Status   SARS Coronavirus 2 NEGATIVE NEGATIVE Final    Comment: (NOTE) SARS-CoV-2 target nucleic acids are NOT DETECTED.  The SARS-CoV-2 RNA is generally detectable in upper and lower respiratory specimens during the acute phase of infection. Negative results do not preclude SARS-CoV-2 infection, do not rule out co-infections with other pathogens, and should not be used as the sole basis for treatment or other patient management decisions. Negative results must be combined with clinical observations, patient history, and epidemiological information. The expected result is Negative.  Fact Sheet for Patients: SugarRoll.be  Fact Sheet for Healthcare Providers: https://www.woods-mathews.com/  This test is not yet approved or cleared by the [place] FDA and  has been authorized for detection and/or diagnosis of SARS-CoV-2 by FDA under an Emergency Use Authorization (EUA). This EUA will remain  in effect (meaning this test can be used) for the duration of the COVID-19 declaration under Se ction 564(b)(1) of the Act, 21 U.S.C. section 360bbb-3(b)(1), unless the authorization is terminated or revoked sooner.  Performed at [facility], [place] [address], [place], [place] [address]       Studies: DG  Chest 2 View  Result Date: 03/11/2021 CLINICAL DATA:  Back pain after recent fall. EXAM: CHEST - 2 VIEW COMPARISON:  Chest x-ray dated August 27, 2017. FINDINGS: The heart size and mediastinal contours are within normal limits. Normal pulmonary vascularity. No focal consolidation, pleural effusion, or pneumothorax. Bilateral nipple shadows again noted. Acute on chronic T11 compression fracture as described on concurrent thoracic spine x-rays from same day. IMPRESSION: 1. No acute cardiopulmonary disease. 2.  Acute on chronic T11 compression fracture as described on concurrent thoracic spine x-rays from same day. Electronically Signed   By: Titus Dubin M.D.   On: 03/11/2021 15:11   DG Thoracic Spine 2 View  Result Date: 03/11/2021 CLINICAL DATA:  Back pain after fall. EXAM: THORACIC SPINE 2 VIEWS COMPARISON:  Lumbar spine x-rays dated October 14, 2019. Chest x-ray dated August 27, 2017. FINDINGS: Progressive now severe T11 vertebral body height loss since prior study. Remaining thoracic vertebral body heights are preserved. Alignment is normal. Disc spaces are relatively maintained. IMPRESSION: 1. Acute on chronic T11 compression fracture, with progressive now severe height loss compared to the prior study. Electronically Signed   By: Titus Dubin M.D.   On: 03/11/2021 15:09   DG Lumbar Spine 2-3 Views  Result Date: 03/11/2021 CLINICAL DATA:  Low back pain after fall. EXAM: LUMBAR SPINE - 2-3 VIEW COMPARISON:  Lumbar spine x-rays dated October 14, 2019. FINDINGS: New mild L2 superior endplate compression fracture. Chronic mild L1 superior endplate compression fracture. Normal alignment. Unchanged mild multilevel lumbar spondylosis. Osteopenia. Prior left total hip arthroplasty. IMPRESSION: 1. New mild L2 superior endplate compression fracture, likely acute given clinical history. Correlate with point tenderness. 2. Chronic mild L1 compression fracture. Electronically Signed   By: Titus Dubin M.D.   On: 03/11/2021 15:06   MR THORACIC SPINE WO CONTRAST  Result Date: 03/12/2021 CLINICAL DATA:  Spinal fracture, lumbosacral, traumatic. EXAM: MRI THORACIC AND LUMBAR SPINE WITHOUT CONTRAST TECHNIQUE: Multiplanar and multiecho pulse sequences of the thoracic and lumbar spine were obtained without intravenous contrast. COMPARISON:  Radiographs March 11, 2021. FINDINGS: MRI THORACIC SPINE FINDINGS Alignment:  Exaggerated thoracic kyphosis. Vertebrae: T11 vertebral fracture with loss of approximately 80% of  vertebral body height centrally, progressed from radiographs performed in January 2021. Marrow edema is consistent with acute/subacute process. No retropulsion. No evidence of discitis or aggressive bone lesion. Cord:  Normal signal and morphology. Paraspinal and other soft tissues: Negative. Disc levels: Small posterior disc protrusion at T5-6, T6-7, T7-8, T8-9, T10-11 and T11-12. Mild facet degenerative changes at T9-10, T10-11 and T11-12. No significant spinal canal or neural foraminal stenosis at any level. MRI LUMBAR SPINE FINDINGS Segmentation:  Standard. Alignment: Small anterolisthesis of T12 over L1. Small retrolisthesis of L1 over L2. Mild dextroconvex scoliosis of the lumbar spine. Vertebrae: Compression fracture of the superior endplate of L2 resulting in approximately 35% loss of vertebral body height. There is associated marrow edema, consistent with acute/subacute process. No retropulsion. No evidence of discitis or aggressive bone lesion. Mild chronic compression fracture of L1 with associated Schmorl node. Postsurgical changes from L3-4 and L4-5 laminectomy. Conus medullaris and cauda equina: Conus extends to the L1 level. Conus and cauda equina appear normal. Paraspinal and other soft tissues: A 2.7 cm right adrenal lesion with fatty content, may represent a myelolipoma. Disc levels: T12-L1: Shallow disc bulge and mild facet degenerative changes. No spinal canal or neural foraminal stenosis. L1-2: Disc bulge, mild facet degenerative changes and ligamentum flavum redundancy resulting in mild right and  moderate left neural foraminal narrowing. No spinal canal stenosis. L2-3: Disc bulge, hypertrophic facet degenerative changes and ligamentum flavum redundancy resulting in mild narrowing of the bilateral subarticular zones, mild right and mild-to-moderate left neural foraminal narrowing. L3-4: Disc bulge, hypertrophic facet degenerative changes resulting in narrowing of the bilateral subarticular  zones, mild right and severe left neural foraminal narrowing. L4-5: Disc bulge, hypertrophic facet degenerative changes resulting in moderate bilateral neural foraminal narrowing, right greater than left. No spinal canal stenosis. L5-S1: Disc bulge, hypertrophic facet degenerative changes without significant spinal canal or neural foraminal stenosis. IMPRESSION: 1. T11 compression fracture with associated marrow edema, consistent with acute/subacute on chronic process. There is loss of approximately 80% of vertebral body height centrally. No retropulsion. 2. Acute/subacute compression fracture of the superior endplate of L2 with approximately 30% loss of vertebral body height. No retropulsion. 3. Degenerative changes of the thoracic spine without significant spinal canal or neural foraminal stenosis at any level. 4. Degenerative changes of the lumbar spine with multilevel neural foraminal narrowing, severe on the left at L3-4, moderate on the left at L1-2 and moderate bilaterally at L4-5. 5. 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 Electronically Signed   By: Pedro Earls M.D.   On: 03/12/2021 11:04   MR LUMBAR SPINE WO CONTRAST  Result Date: 03/12/2021 CLINICAL DATA:  Spinal fracture, lumbosacral, traumatic. EXAM: MRI THORACIC AND LUMBAR SPINE WITHOUT CONTRAST TECHNIQUE: Multiplanar and multiecho pulse sequences of the thoracic and lumbar spine were obtained without intravenous contrast. COMPARISON:  Radiographs March 11, 2021. FINDINGS: MRI THORACIC SPINE FINDINGS Alignment:  Exaggerated thoracic kyphosis. Vertebrae: T11 vertebral fracture with loss of approximately 80% of vertebral body height centrally, progressed from radiographs performed in January 2021. Marrow edema is consistent with acute/subacute process. No retropulsion. No evidence of discitis or aggressive bone lesion. Cord:  Normal signal and morphology. Paraspinal and other soft tissues:  Negative. Disc levels: Small posterior disc protrusion at T5-6, T6-7, T7-8, T8-9, T10-11 and T11-12. Mild facet degenerative changes at T9-10, T10-11 and T11-12. No significant spinal canal or neural foraminal stenosis at any level. MRI LUMBAR SPINE FINDINGS Segmentation:  Standard. Alignment: Small anterolisthesis of T12 over L1. Small retrolisthesis of L1 over L2. Mild dextroconvex scoliosis of the lumbar spine. Vertebrae: Compression fracture of the superior endplate of L2 resulting in approximately 35% loss of vertebral body height. There is associated marrow edema, consistent with acute/subacute process. No retropulsion. No evidence of discitis or aggressive bone lesion. Mild chronic compression fracture of L1 with associated Schmorl node. Postsurgical changes from L3-4 and L4-5 laminectomy. Conus medullaris and cauda equina: Conus extends to the L1 level. Conus and cauda equina appear normal. Paraspinal and other soft tissues: A 2.7 cm right adrenal lesion with fatty content, may represent a myelolipoma. Disc levels: T12-L1: Shallow disc bulge and mild facet degenerative changes. No spinal canal or neural foraminal stenosis. L1-2: Disc bulge, mild facet degenerative changes and ligamentum flavum redundancy resulting in mild right and moderate left neural foraminal narrowing. No spinal canal stenosis. L2-3: Disc bulge, hypertrophic facet degenerative changes and ligamentum flavum redundancy resulting in mild narrowing of the bilateral subarticular zones, mild right and mild-to-moderate left neural foraminal narrowing. L3-4: Disc bulge, hypertrophic facet degenerative changes resulting in narrowing of the bilateral subarticular zones, mild right and severe left neural foraminal narrowing. L4-5: Disc bulge, hypertrophic facet degenerative changes resulting in moderate bilateral neural foraminal narrowing, right greater than left. No spinal canal stenosis.  L5-S1: Disc bulge, hypertrophic facet degenerative changes  without significant spinal canal or neural foraminal stenosis. IMPRESSION: 1. T11 compression fracture with associated marrow edema, consistent with acute/subacute on chronic process. There is loss of approximately 80% of vertebral body height centrally. No retropulsion. 2. Acute/subacute compression fracture of the superior endplate of L2 with approximately 30% loss of vertebral body height. No retropulsion. 3. Degenerative changes of the thoracic spine without significant spinal canal or neural foraminal stenosis at any level. 4. Degenerative changes of the lumbar spine with multilevel neural foraminal narrowing, severe on the left at L3-4, moderate on the left at L1-2 and moderate bilaterally at L4-5. 5. 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 Electronically Signed   By: Pedro Earls M.D.   On: 03/12/2021 11:04    Scheduled Meds:  albuterol  3 mL Nebulization TID   calcium carbonate  1 tablet Oral TID WC   chlordiazePOXIDE  10 mg Oral TID   cholecalciferol  5,000 Units Oral Daily   folic acid  1 mg Oral Daily   LORazepam  0-4 mg Oral Q6H   Followed by   LORazepam  0-4 mg Oral Q12H   losartan  100 mg Oral Daily   mometasone-formoterol  2 puff Inhalation BID   multivitamin with minerals  1 tablet Oral Daily   nicotine  21 mg Transdermal Daily   potassium chloride  40 mEq Oral Q6H   senna-docusate  2 tablet Oral BID   thiamine  100 mg Oral Daily   Or   thiamine  100 mg Intravenous Daily   Continuous Infusions:  0.9 % NaCl with KCl 20 mEq / L 75 mL/hr at 03/12/21 1359    ceFAZolin (ANCEF) IV       Assessment/Plan:  Acute rhabdomyolysis.  CK trending down which is reassuring, will recheck level in a.m., continue to hold pravastatin, CPK trending better from 3230 down to 1551.  Continue to hold pravastatin.  Likely from fall and being on the ground for a few days. Acute on chronic compression fracture T11 and compression  fracture L2.  MRI reviewed and T11 has 80% vertebral body height loss.  Dr. Rudene Christians from orthopedic surgery planning for kyphoplasty this afternoon.   Hypomagnesemia and hypokalemia.  Repleted, magnesium is again low today at 3.1, will replete and recheck in a.m. Hyponatremia likely secondary to alcohol.  Improving with IV hydration Alcohol abuse on alcohol withdrawal protocol.  Thiamine.  Some tremors today, he reports drinks 1-1/2 gallon every week of vodka, I will start him on low-dose Librium to prevent withdrawals given he started to develop tremors, check magnesium and phosphorus level in a.m. as well. Thrombocytopenia secondary to chronic alcohol use.  Check hepatitis C. COPD   Continue albuterol nebulizer and Dulera. Essential hypertension on Cozaar Tobacco abuse: Counseled      Code Status:     Code Status Orders  (From admission, onward)           Start     Ordered   03/11/21 1630  Full code  Continuous        03/11/21 1631           Code Status History     Date Active Date Inactive Code Status Order ID Comments User Context   03/19/2017 1246 03/20/2017 1547 Full Code [id number]  Susa Day, MD Inpatient   10/22/2016 1149 10/23/2016 1624 Full Code [id number]  Susa Day, MD Inpatient  04/22/2016 1510 04/22/2016 2212 Full Code [id number]  Serafina Mitchell, MD Inpatient      Family Communication: None at bedside Disposition Plan: Status is: Inpatient  Dispo: The patient is from: Home              Anticipated d/c is to: Home versus rehab depending on clinical course.              Patient currently being treated for rhabdomyolysis.  Dr. Rudene Christians following for compression fractures.   Difficult to place patient.  Hopefully not  Consultants: Orthopedic surgery   [facility]  [facility]

## 2021-03-13 NOTE — Transfer of Care (Signed)
Immediate Anesthesia Transfer of Care Note  Patient: Mark Stevenson  Procedure(s) Performed: KYPHOPLASTY - T11 and L2  Patient Location: PACU  Anesthesia Type:General  Level of Consciousness: awake and drowsy  Airway & Oxygen Therapy: Patient Spontanous Breathing and Patient connected to nasal cannula oxygen  Post-op Assessment: Report given to RN and Post -op Vital signs reviewed and stable  Post vital signs: Reviewed and stable  Last Vitals:  Vitals Value Taken Time  BP 102/74   Temp    Pulse 88 03/13/21 1549  Resp 18 03/13/21 1549  SpO2 96 % 03/13/21 1549  Vitals shown include unvalidated device data.  Last Pain:  Vitals:   03/13/21 1408  TempSrc:   PainSc: 6          Complications: No notable events documented.

## 2021-03-14 ENCOUNTER — Encounter: Payer: Self-pay | Admitting: Orthopedic Surgery

## 2021-03-14 DIAGNOSIS — W19XXXD Unspecified fall, subsequent encounter: Secondary | ICD-10-CM | POA: Diagnosis not present

## 2021-03-14 DIAGNOSIS — S22080S Wedge compression fracture of T11-T12 vertebra, sequela: Secondary | ICD-10-CM | POA: Diagnosis not present

## 2021-03-14 DIAGNOSIS — F1010 Alcohol abuse, uncomplicated: Secondary | ICD-10-CM | POA: Diagnosis not present

## 2021-03-14 LAB — CBC
HCT: 33.6 % — ABNORMAL LOW (ref 39.0–52.0)
Hemoglobin: 11.6 g/dL — ABNORMAL LOW (ref 13.0–17.0)
MCH: 36.5 pg — ABNORMAL HIGH (ref 26.0–34.0)
MCHC: 34.5 g/dL (ref 30.0–36.0)
MCV: 105.7 fL — ABNORMAL HIGH (ref 80.0–100.0)
Platelets: 123 10*3/uL — ABNORMAL LOW (ref 150–400)
RBC: 3.18 MIL/uL — ABNORMAL LOW (ref 4.22–5.81)
RDW: 12.2 % (ref 11.5–15.5)
WBC: 7.8 10*3/uL (ref 4.0–10.5)
nRBC: 0 % (ref 0.0–0.2)

## 2021-03-14 LAB — PHOSPHORUS: Phosphorus: 2.7 mg/dL (ref 2.5–4.6)

## 2021-03-14 LAB — BASIC METABOLIC PANEL
Anion gap: 6 (ref 5–15)
BUN: 20 mg/dL (ref 8–23)
CO2: 24 mmol/L (ref 22–32)
Calcium: 9.3 mg/dL (ref 8.9–10.3)
Chloride: 106 mmol/L (ref 98–111)
Creatinine, Ser: 0.92 mg/dL (ref 0.61–1.24)
GFR, Estimated: >60 mL/min (ref >60)
Glucose, Bld: 205 mg/dL — ABNORMAL HIGH (ref 70–99)
Potassium: 4.4 mmol/L (ref 3.5–5.1)
Sodium: 136 mmol/L (ref 135–145)

## 2021-03-14 LAB — CK: Total CK: 406 U/L — ABNORMAL HIGH (ref 49–397)

## 2021-03-14 LAB — MAGNESIUM: Magnesium: 1.7 mg/dL (ref 1.7–2.4)

## 2021-03-14 MED ORDER — MAGNESIUM SULFATE IN D5W 1-5 GM/100ML-% IV SOLN
1.0000 g | Freq: Once | INTRAVENOUS | Status: AC
  Administered 2021-03-14: 1 g via INTRAVENOUS
  Filled 2021-03-14: qty 100

## 2021-03-14 MED ORDER — IPRATROPIUM-ALBUTEROL 0.5-2.5 (3) MG/3ML IN SOLN
3.0000 mL | Freq: Two times a day (BID) | RESPIRATORY_TRACT | Status: DC
  Administered 2021-03-14 – 2021-03-17 (×5): 3 mL via RESPIRATORY_TRACT
  Filled 2021-03-14 (×6): qty 3

## 2021-03-14 MED ORDER — ALBUTEROL SULFATE (2.5 MG/3ML) 0.083% IN NEBU
3.0000 mL | INHALATION_SOLUTION | RESPIRATORY_TRACT | Status: DC | PRN
  Administered 2021-03-15 – 2021-03-17 (×2): 3 mL via RESPIRATORY_TRACT
  Filled 2021-03-14: qty 3

## 2021-03-14 MED ORDER — LORAZEPAM BOLUS VIA INFUSION
1.0000 mg | Freq: Once | INTRAVENOUS | Status: DC
  Filled 2021-03-14: qty 1

## 2021-03-14 MED ORDER — LABETALOL HCL 5 MG/ML IV SOLN
20.0000 mg | INTRAVENOUS | Status: DC | PRN
  Administered 2021-03-14 – 2021-03-15 (×2): 20 mg via INTRAVENOUS
  Filled 2021-03-14 (×2): qty 4

## 2021-03-14 NOTE — Care Management Important Message (Signed)
Important Message  Patient Details  Name: Mark Stevenson MRN: [id number] Date of Birth: [birth year]   Medicare Important Message Given:  Yes     Juliann Pulse A Anari Evitt 03/14/2021, 3:04 PM

## 2021-03-14 NOTE — Progress Notes (Signed)
Patient ID: Mark Stevenson, male   DOB: [birth year], 65 y.o.   MRN: [id number] [facility] PROGRESS NOTE  DARCELL SABINO XTA:[id number] DOB: October 19, 1955 DOA: 03/11/2021 PCP: Alroy Dust, L.Marlou Sa, MD  HPI  Brief history.  Patient admitted 03/11/2021 with a fall and being on the floor since Friday.  Patient was found to have rhabdomyolysis and 2 compression fractures.  Past medical history of alcohol abuse, thrombocytopenia, COPD.  Work-up significant for T11, L2 fracture, status post kyphoplasty 6/15.  Subjective:  No nausea, no vomiting, still complains of pain in lower back area. Objective: Vitals:   03/14/21 0824 03/14/21 1200  BP: 129/89 134/88  Pulse: (!) 103 97  Resp: 17 17  Temp: 97.8 F (36.6 C) (!) 97.4 F (36.3 C)  SpO2: 95% 98%    Intake/Output Summary (Last 24 hours) at 03/14/2021 1437 Last data filed at 03/14/2021 1412 Gross per 24 hour  Intake 882.03 ml  Output 425 ml  Net 457.03 ml   Filed Weights   03/11/21 1321  Weight: 77.1 kg     Exam:  Awake Alert, Oriented X 3, No new F.N deficits, in mild discomfort due to pain, much less tremors today. Symmetrical Chest wall movement, Good air movement bilaterally, CTAB RRR,No Gallops,Rubs or new Murmurs, No Parasternal Heave +ve B.Sounds, Abd Soft, No tenderness, No rebound - guarding or rigidity. No Cyanosis, Clubbing or edema, No new Rash or bruise       Data Reviewed: Basic Metabolic Panel: Recent Labs  Lab 03/11/21 1332 03/12/21 0344 03/13/21 0406 03/14/21 0351  NA 129* 132* 136 136  K 3.6 3.2* 3.1* 4.4  CL 90* 95* 101 106  CO2 27 28 28 24   GLUCOSE 107* 103* 116* 205*  BUN 23 22 20 20   CREATININE 0.96 0.83 0.86 0.92  CALCIUM 9.3 9.0 9.3 9.3  MG 1.7  --  2.1 1.7  PHOS 3.7  --  3.4 2.7    CBC: Recent Labs  Lab 03/11/21 1332 03/12/21 0344 03/14/21 0351  WBC 14.1* 9.9 7.8  HGB 13.7 13.1 11.6*  HCT 37.8* 35.9* 33.6*  MCV 100.8* 99.7 105.7*  PLT 102* 91* 123*   Cardiac  Enzymes: Recent Labs  Lab 03/11/21 1332 03/12/21 0344 03/14/21 0351  CKTOTAL 3,230* 1,551* 406*     Recent Results (from the past 240 hour(s))  SARS CORONAVIRUS 2 (TAT 6-24 HRS) Nasopharyngeal     Status: None   Collection Time: 03/11/21  3:13 PM   Specimen: Nasopharyngeal  Result Value Ref Range Status   SARS Coronavirus 2 NEGATIVE NEGATIVE Final    Comment: (NOTE) SARS-CoV-2 target nucleic acids are NOT DETECTED.  The SARS-CoV-2 RNA is generally detectable in upper and lower respiratory specimens during the acute phase of infection. Negative results do not preclude SARS-CoV-2 infection, do not rule out co-infections with other pathogens, and should not be used as the sole basis for treatment or other patient management decisions. Negative results must be combined with clinical observations, patient history, and epidemiological information. The expected result is Negative.  Fact Sheet for Patients: SugarRoll.be  Fact Sheet for Healthcare Providers: https://www.woods-mathews.com/  This test is not yet approved or cleared by the [place] FDA and  has been authorized for detection and/or diagnosis of SARS-CoV-2 by FDA under an Emergency Use Authorization (EUA). This EUA will remain  in effect (meaning this test can be used) for the duration of the COVID-19 declaration under Se ction 564(b)(1) of the Act, 21 U.S.C. section 360bbb-3(b)(1), unless the authorization  is terminated or revoked sooner.  Performed at [facility], [place] [address], [place], [place] [address]      Studies: DG Lumbar Spine 2-3 Views  Result Date: 03/13/2021 CLINICAL DATA:  T11, L2 kyphoplasty EXAM: LUMBAR SPINE - 2-3 VIEW; DG C-ARM 1-60 MIN COMPARISON:  03/12/2021 MRI FLUOROSCOPY TIME:  Fluoroscopy Time:  2 minutes 6 seconds Radiation Exposure Index (if provided by the fluoroscopic device): 47.9 mGy Number of Acquired Spot Images: 4 FINDINGS:  Contrast laden cement is noted at T11 and L1 consistent with the recent history of kyphoplasty. IMPRESSION: T11 and L1 kyphoplasty. Electronically Signed   By: Inez Catalina M.D.   On: 03/13/2021 16:04   DG C-Arm 1-60 Min  Result Date: 03/13/2021 CLINICAL DATA:  T11, L2 kyphoplasty EXAM: LUMBAR SPINE - 2-3 VIEW; DG C-ARM 1-60 MIN COMPARISON:  03/12/2021 MRI FLUOROSCOPY TIME:  Fluoroscopy Time:  2 minutes 6 seconds Radiation Exposure Index (if provided by the fluoroscopic device): 47.9 mGy Number of Acquired Spot Images: 4 FINDINGS: Contrast laden cement is noted at T11 and L1 consistent with the recent history of kyphoplasty. IMPRESSION: T11 and L1 kyphoplasty. Electronically Signed   By: Inez Catalina M.D.   On: 03/13/2021 16:04    Scheduled Meds:  calcium carbonate  1 tablet Oral TID WC   cholecalciferol  5,000 Units Oral Daily   folic acid  1 mg Oral Daily   ipratropium-albuterol  3 mL Nebulization BID   LORazepam  0-4 mg Oral Q12H   losartan  100 mg Oral Daily   mometasone-formoterol  2 puff Inhalation BID   multivitamin with minerals  1 tablet Oral Daily   nicotine  21 mg Transdermal Daily   senna-docusate  2 tablet Oral BID   thiamine  100 mg Oral Daily   Or   thiamine  100 mg Intravenous Daily   Continuous Infusions:  0.9 % NaCl with KCl 20 mEq / L 75 mL/hr at 03/14/21 0230     Assessment/Plan:  Acute rhabdomyolysis.  CK trending down which is reassuring, it is down to 406 today  acute on chronic compression fracture T11 and compression fracture L2.  MRI reviewed and T11 has 80% vertebral body height loss.  S/p kyphoplasty by Dr. Rudene Christians  on 6/15, continue with as needed pain medication, PT/OT consulted. Hypomagnesemia and hypokalemia.  Repleted,  Hyponatremia likely secondary to alcohol.  Improving with IV hydration Alcohol abuse on alcohol withdrawal protocol.  Thiamine.  Some tremors today, he reports drinks 1-1/2 gallon every week of vodka, no evidence of withdrawals, tremors  much improved, continue with Librium, continue with CIWA protocol.   Thrombocytopenia secondary to chronic alcohol use.  hepatitis C nonreactive COPD   Continue albuterol nebulizer and Dulera. Essential hypertension on Cozaar Tobacco abuse: Counseled       Code Status:     Code Status Orders  (From admission, onward)           Start     Ordered   03/11/21 1630  Full code  Continuous        03/11/21 1631           Code Status History     Date Active Date Inactive Code Status Order ID Comments User Context   03/19/2017 1246 03/20/2017 1547 Full Code [id number]  Susa Day, MD Inpatient   10/22/2016 1149 10/23/2016 1624 Full Code [id number]  Susa Day, MD Inpatient   04/22/2016 1510 04/22/2016 2212 Full Code [id number]  Serafina Mitchell, MD  Inpatient      Family Communication: None at bedside Disposition Plan: Status is: Inpatient  Dispo: The patient is from: Home              Anticipated d/c is to: Home versus rehab depending on clinical course.              Patient currently being treated for rhabdomyolysis.  Pain remains uncontrolled   Difficult to place patient.  No  Consultants: Orthopedic surgery   [facility]  [facility]

## 2021-03-14 NOTE — Evaluation (Signed)
Physical Therapy Evaluation Patient Details Name: Mark Stevenson MRN: [id number] DOB: [birth year] Today's Date: 03/14/2021   History of Present Illness  Pt admitted for fall tripping over dog and unable to get up off floor. Laid in floor for a few days and developed rhabdomyelitis. HIstory includes COPD, HTN, and arthritis. Pt with acute T11 and L1/2 compression fx and is s/p kypho 03/13/21.  Clinical Impression  Pt is a pleasant 65 year old male who was admitted for compression fx and is now s/p kyphoplasty. Pt performs bed mobility with cga, transfers with min assist, and ambulation with cga and RW. Pt demonstrates deficits with strength/mobility/pain. Would recommend to continue to monitor vitals with mobility. Would benefit from skilled PT to address above deficits and promote optimal return to PLOF. Recommend transition to [place] upon discharge from acute hospitalization.     Follow Up Recommendations Home health PT;Supervision/Assistance - 24 hour    Equipment Recommendations  None recommended by PT    Recommendations for Other Services       Precautions / Restrictions Precautions Precautions: Fall Restrictions Weight Bearing Restrictions: No      Mobility  Bed Mobility Overal bed mobility: Needs Assistance Bed Mobility: Sit to Supine       Sit to supine: Min guard   General bed mobility comments: Cues for sequencing. Safe technique    Transfers Overall transfer level: Needs assistance Equipment used: Rolling walker (2 wheeled) Transfers: Sit to/from Stand Sit to Stand: Min assist         General transfer comment: Pt found on low toilet. Needed min assist for transfer with B UE support.  Ambulation/Gait Ambulation/Gait assistance: Min guard Gait Distance (Feet): 80 Feet Assistive device: Rolling walker (2 wheeled) Gait Pattern/deviations: Step-through pattern     General Gait Details: ambulated with forward flexed posture. RW used with safe technique.  Unsteadiness noted with tremors. Fatigues quickly, requesting to return back to room, unable to complete full lap. O2 sats at 87% on RA and HR up to 139bpm with ambulation  Stairs            Wheelchair Mobility    Modified Rankin (Stroke Patients Only)       Balance Overall balance assessment: Needs assistance;History of Falls Sitting-balance support: Feet supported Sitting balance-Leahy Scale: Good     Standing balance support: Bilateral upper extremity supported Standing balance-Leahy Scale: Fair                               Pertinent Vitals/Pain Pain Assessment: 0-10 Pain Score: 5  Pain Location: low back Pain Descriptors / Indicators: Operative site guarding Pain Intervention(s): Limited activity within patient's tolerance;Premedicated before session;Repositioned    Home Living Family/patient expects to be discharged to:: Private residence Living Arrangements: Alone Available Help at Discharge: Family;Available 24 hours/day (mom or sister available for assistance) Type of Home: House Home Access: Stairs to enter Entrance Stairs-Rails: None Entrance Stairs-Number of Steps: 2 Home Layout: One level Home Equipment: Walker - 2 wheels      Prior Function Level of Independence: Independent         Comments: reports previous falls. This current fall resulting from tripping over dog     Hand Dominance        Extremity/Trunk Assessment   Upper Extremity Assessment Upper Extremity Assessment: Overall WFL for tasks assessed    Lower Extremity Assessment Lower Extremity Assessment: Generalized weakness (B LE grossly 4/5)  Communication   Communication: No difficulties  Cognition Arousal/Alertness: Awake/alert Behavior During Therapy: WFL for tasks assessed/performed Overall Cognitive Status: Within Functional Limits for tasks assessed                                 General Comments: B UE tremors noted       General Comments      Exercises Other Exercises Other Exercises: Upon arrival, pt on low commode in bathroom. Needs min assist for transfers and supervision for hygiene and line management.   Assessment/Plan    PT Assessment Patient needs continued PT services  PT Problem List Decreased activity tolerance;Decreased balance;Decreased mobility;Decreased strength;Decreased knowledge of precautions;Cardiopulmonary status limiting activity;Pain       PT Treatment Interventions DME instruction;Gait training;Stair training;Therapeutic exercise;Balance training    PT Goals (Current goals can be found in the Care Plan section)  Acute Rehab PT Goals Patient Stated Goal: to go home PT Goal Formulation: With patient Time For Goal Achievement: 03/28/21 Potential to Achieve Goals: Good    Frequency 7X/week   Barriers to discharge        Co-evaluation               AM-PAC PT "6 Clicks" Mobility  Outcome Measure Help needed turning from your back to your side while in a flat bed without using bedrails?: A Little Help needed moving from lying on your back to sitting on the side of a flat bed without using bedrails?: A Little Help needed moving to and from a bed to a chair (including a wheelchair)?: A Little Help needed standing up from a chair using your arms (e.g., wheelchair or bedside chair)?: A Little Help needed to walk in hospital room?: A Little Help needed climbing 3-5 steps with a railing? : A Little 6 Click Score: 18    End of Session Equipment Utilized During Treatment: Gait belt Activity Tolerance: Patient limited by pain Patient left: in bed;with bed alarm set (no recliner in room) Nurse Communication: Mobility status PT Visit Diagnosis: Unsteadiness on feet (R26.81);Muscle weakness (generalized) (M62.81);History of falling (Z91.81);Difficulty in walking, not elsewhere classified (R26.2);Pain Pain - Right/Left:  (low back) Pain - part of body:  (back)    Time:  0821-0901 PT Time Calculation (min) (ACUTE ONLY): 40 min   Charges:   PT Evaluation $PT Eval Low Complexity: 1 Low PT Treatments $Gait Training: 8-22 mins $Therapeutic Activity: 8-22 mins        Greggory Stallion, PT, DPT [phone]   Tonyia Marschall 03/14/2021, 4:56 PM

## 2021-03-14 NOTE — Progress Notes (Signed)
   03/14/21 0900  Clinical Encounter Type  Visited With Patient  Visit Type Initial  Referral From Chaplain  Consult/Referral To [facility] visited room 1A-157A Pt, Mark Stevenson. [name] is an 65 y.o. male who suffered a fall at home and was on the ground for a few days. He came in with rhabdomyolysis and was found to have compression fractures as well.  [name] shared with me that he has multiple other medical problems and he has had prior back surgery also. I provided reflective listening, emotional and spiritual support.

## 2021-03-15 DIAGNOSIS — S32020S Wedge compression fracture of second lumbar vertebra, sequela: Secondary | ICD-10-CM | POA: Diagnosis not present

## 2021-03-15 DIAGNOSIS — R338 Other retention of urine: Secondary | ICD-10-CM | POA: Diagnosis not present

## 2021-03-15 DIAGNOSIS — W19XXXD Unspecified fall, subsequent encounter: Secondary | ICD-10-CM | POA: Diagnosis not present

## 2021-03-15 LAB — SURGICAL PATHOLOGY

## 2021-03-15 MED ORDER — TAMSULOSIN HCL 0.4 MG PO CAPS
0.4000 mg | ORAL_CAPSULE | Freq: Every day | ORAL | Status: DC
  Administered 2021-03-15 – 2021-03-18 (×4): 0.4 mg via ORAL
  Filled 2021-03-15 (×4): qty 1

## 2021-03-15 MED ORDER — POLYETHYLENE GLYCOL 3350 17 G PO PACK
17.0000 g | PACK | Freq: Every day | ORAL | Status: AC
  Administered 2021-03-15 – 2021-03-16 (×2): 17 g via ORAL
  Filled 2021-03-15 (×2): qty 1

## 2021-03-15 MED ORDER — LORAZEPAM 2 MG/ML IJ SOLN
1.0000 mg | Freq: Once | INTRAMUSCULAR | Status: AC
  Administered 2021-03-15: 1 mg via INTRAVENOUS
  Filled 2021-03-15: qty 1

## 2021-03-15 NOTE — TOC Progression Note (Signed)
Transition of Care [facility]) - Progression Note    Patient Details  Name: Mark Stevenson MRN: [id number] Date of Birth: 1955-11-25  Transition of Care [facility]) CM/SW [place], RN Phone Number: 03/15/2021, 3:43 PM  Clinical Narrative:     Met with the patient to discuss DC plan and needs He is agreeable to [facility] services and [facility] has accepted the patient  He stated that he has 3 walkers at home and a 3 in1 is not needed and would not work over his toilet, He asked that I call his sister, I called and explained that he iwll go home w Home health, she stated understanding and agreeance, his sister will provide transportation.         Expected Discharge Plan and Services                                                 Social Determinants of Health (SDOH) Interventions    Readmission Risk Interventions No flowsheet data found.

## 2021-03-15 NOTE — Progress Notes (Signed)
Physical Therapy Treatment Patient Details Name: Mark Stevenson MRN: [id number] DOB: 19-Apr-1956 Today's Date: 03/15/2021    History of Present Illness Pt admitted for fall tripping over dog and unable to get up off floor. Laid in floor for a few days and developed rhabdomyelitis. HIstory includes COPD, HTN, and arthritis. Pt with acute T11 and L1/2 compression fx and is s/p kypho 03/13/21.    PT Comments    The pt presents this session with improved tolerance to gait training. He continues to require seated rest breaks and reports RPE of 5/10 following 100'. The pt presents with [place]etti Balance and Gait score of 14/28 which is indicative of high risk of falling. He is unable to ascend 2 steps with HHA and no railing without Total A d/t near fall 2/2 significant balance impairment. The pt continues to demonstrate safety to return home, however, is educated on the need for +2 assistance for stair navigation. PT will continue to follow in house to address balance and gait deficits.   Follow Up Recommendations  Home health PT;Supervision/Assistance - 24 hour     Equipment Recommendations  None recommended by PT    Recommendations for Other Services       Precautions / Restrictions Precautions Precautions: Fall    Mobility  Bed Mobility Overal bed mobility: Needs Assistance Bed Mobility: Sit to Supine       Sit to supine: Supervision   General bed mobility comments: Pt demonstrates use of bed railings. Attempted simulation of home environment. HOB flat    Transfers Overall transfer level: Needs assistance Equipment used: Rolling walker (2 wheeled) Transfers: Sit to/from Stand Sit to Stand: From elevated surface;Supervision;Min assist         General transfer comment: Pt requires Min A for sit<>stand from toilet height. Verbal cues for forward translation. Pt requires supervision for sit<>stand from elevated surface.  Ambulation/Gait Ambulation/Gait assistance: Min  guard Gait Distance (Feet): 200 Feet Assistive device: Rolling walker (2 wheeled) Gait Pattern/deviations: Decreased step length - left;Decreased stance time - right;Trunk flexed;Narrow base of support Gait velocity: 0.83 Gait velocity interpretation: <1.31 ft/sec, indicative of household ambulator General Gait Details: 100' seated rest break x 5 min; 2 steps, seated rest break x5 min, 100'.   Stairs Stairs: Yes Stairs assistance: Total assist;Min assist Stair Management: No rails;Two rails Number of Stairs: 2 General stair comments: Attempted x2 steps with no HR to simulate home environment. Pt with near fall d/t fear of falling and significant balance deficits. Total A for balance and safety. Min A with use of bilateral HR.   Wheelchair Mobility    Modified Rankin (Stroke Patients Only)       Balance Overall balance assessment: Needs assistance;History of Falls         Standing balance support: During functional activity Standing balance-Leahy Scale: Fair                              Cognition Arousal/Alertness: Awake/alert Behavior During Therapy: WFL for tasks assessed/performed Overall Cognitive Status: Within Functional Limits for tasks assessed                                        Exercises      General Comments        Pertinent Vitals/Pain Pain Assessment: No/denies pain    Home Living  Prior Function            PT Goals (current goals can now be found in the care plan section) Acute Rehab PT Goals Patient Stated Goal: to go home PT Goal Formulation: With patient Time For Goal Achievement: 03/28/21 Potential to Achieve Goals: Good Additional Goals Additional Goal #1: Pt will be able to perform bed mobility/transfers with supervision and safe technique in order to improve functional independence Progress towards PT goals: Progressing toward goals    Frequency    7X/week       PT Plan Current plan remains appropriate    Co-evaluation              AM-PAC PT "6 Clicks" Mobility   Outcome Measure  Help needed turning from your back to your side while in a flat bed without using bedrails?: A Little Help needed moving from lying on your back to sitting on the side of a flat bed without using bedrails?: A Little Help needed moving to and from a bed to a chair (including a wheelchair)?: A Little Help needed standing up from a chair using your arms (e.g., wheelchair or bedside chair)?: A Little Help needed to walk in hospital room?: A Little Help needed climbing 3-5 steps with a railing? : A Little 6 Click Score: 18    End of Session Equipment Utilized During Treatment: Gait belt   Patient left: with nursing/sitter in room Nurse Communication: Mobility status PT Visit Diagnosis: Unsteadiness on feet (R26.81);Muscle weakness (generalized) (M62.81);History of falling (Z91.81);Difficulty in walking, not elsewhere classified (R26.2);Pain     Time: 6269-4854 PT Time Calculation (min) (ACUTE ONLY): 35 min  Charges:  $Gait Training: 23-37 mins                     9:32 AM, 03/15/21 Evertt Chouinard A. Saverio Danker PT, DPT Physical Therapist - [facility] A Lilybeth Vien 03/15/2021, 9:29 AM

## 2021-03-15 NOTE — Progress Notes (Addendum)
Patient ID: Mark Stevenson, male   DOB: 1955-10-27, 65 y.o.   MRN: [id number] [facility] PROGRESS NOTE  NECHEMIA CHIAPPETTA FMB:[id number] DOB: May 07, 1956 DOA: 03/11/2021 PCP: Alroy Dust, L.Marlou Sa, MD  HPI   The service date of this note is 03/15/2021, it has been modified back to the original note as it has been addended by mistake on 6/18.  Brief history.  Patient admitted 03/11/2021 with a fall and being on the floor since Friday.  Patient was found to have rhabdomyolysis and 2 compression fractures.  Past medical history of alcohol abuse, thrombocytopenia, COPD.  Work-up significant for T11, L2 fracture, status post kyphoplasty 6/15.    Subjective:  Patient with urinary retention required in and out overnight.  Objective: Vitals:   03/15/21 1148 03/15/21 1523  BP: (!) 131/96 (!) 126/92  Pulse: 86 87  Resp: 17 20  Temp: 97.8 F (36.6 C) 98.8 F (37.1 C)  SpO2: 98% 98%    Intake/Output Summary (Last 24 hours) at 03/15/2021 1633 Last data filed at 03/15/2021 1300 Gross per 24 hour  Intake 240 ml  Output 2550 ml  Net -2310 ml   Filed Weights   03/11/21 1321  Weight: 77.1 kg     Exam:  Awake Alert, Oriented X 3, No new F.N deficits, Normal affect, some tremors Symmetrical Chest wall movement, Good air movement bilaterally, CTAB RRR,No Gallops,Rubs or new Murmurs, No Parasternal Heave +ve B.Sounds, Abd Soft, No tenderness, No rebound - guarding or rigidity. No Cyanosis, Clubbing or edema, No new Rash or bruise           Data Reviewed: Basic Metabolic Panel: Recent Labs  Lab 03/11/21 1332 03/12/21 0344 03/13/21 0406 03/14/21 0351  NA 129* 132* 136 136  K 3.6 3.2* 3.1* 4.4  CL 90* 95* 101 106  CO2 27 28 28 24   GLUCOSE 107* 103* 116* 205*  BUN 23 22 20 20   CREATININE 0.96 0.83 0.86 0.92  CALCIUM 9.3 9.0 9.3 9.3  MG 1.7  --  2.1 1.7  PHOS 3.7  --  3.4 2.7    CBC: Recent Labs  Lab 03/11/21 1332 03/12/21 0344 03/14/21 0351  WBC 14.1* 9.9 7.8   HGB 13.7 13.1 11.6*  HCT 37.8* 35.9* 33.6*  MCV 100.8* 99.7 105.7*  PLT 102* 91* 123*   Cardiac Enzymes: Recent Labs  Lab 03/11/21 1332 03/12/21 0344 03/14/21 0351  CKTOTAL 3,230* 1,551* 406*     Recent Results (from the past 240 hour(s))  SARS CORONAVIRUS 2 (TAT 6-24 HRS) Nasopharyngeal     Status: None   Collection Time: 03/11/21  3:13 PM   Specimen: Nasopharyngeal  Result Value Ref Range Status   SARS Coronavirus 2 NEGATIVE NEGATIVE Final    Comment: (NOTE) SARS-CoV-2 target nucleic acids are NOT DETECTED.  The SARS-CoV-2 RNA is generally detectable in upper and lower respiratory specimens during the acute phase of infection. Negative results do not preclude SARS-CoV-2 infection, do not rule out co-infections with other pathogens, and should not be used as the sole basis for treatment or other patient management decisions. Negative results must be combined with clinical observations, patient history, and epidemiological information. The expected result is Negative.  Fact Sheet for Patients: SugarRoll.be  Fact Sheet for Healthcare Providers: https://www.woods-mathews.com/  This test is not yet approved or cleared by the [place] FDA and  has been authorized for detection and/or diagnosis of SARS-CoV-2 by FDA under an Emergency Use Authorization (EUA). This EUA will remain  in effect (meaning  this test can be used) for the duration of the COVID-19 declaration under Se ction 564(b)(1) of the Act, 21 U.S.C. section 360bbb-3(b)(1), unless the authorization is terminated or revoked sooner.  Performed at [facility], [place] [address] [place], [place] [address]      Studies: No results found.  Scheduled Meds:  calcium carbonate  1 tablet Oral TID WC   cholecalciferol  5,000 Units Oral Daily   folic acid  1 mg Oral Daily   ipratropium-albuterol  3 mL Nebulization BID   losartan  100 mg Oral Daily    mometasone-formoterol  2 puff Inhalation BID   multivitamin with minerals  1 tablet Oral Daily   nicotine  21 mg Transdermal Daily   polyethylene glycol  17 g Oral Daily   senna-docusate  2 tablet Oral BID   tamsulosin  0.4 mg Oral QPC supper   thiamine  100 mg Oral Daily   Continuous Infusions:     Assessment/Plan:  Acute rhabdomyolysis.  CK trending down which is reassuring, it is down to 406 . 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  MRI reviewed and T11 has 80% vertebral body height loss.  S/p kyphoplasty by Dr. Rudene Christians  on 6/15, continue with as needed pain medication, PT/OT consulted. Hypomagnesemia and hypokalemia.  Repleted,  Hyponatremia likely secondary to alcohol.  Improving with IV hydration Alcohol abuse on alcohol withdrawal protocol.  Thiamine.  Some tremors today, he reports drinks 1-1/2 gallon every week of vodka, no evidence of withdrawals, tremors much improved, he has been treated with Librium scheduled, and CIWA protocol.   Thrombocytopenia secondary to chronic alcohol use.  hepatitis C nonreactive  COPD   Continue albuterol nebulizer and Dulera. Essential hypertension on Cozaar Tobacco abuse: Counseled Urinary retention, continue with close monitoring with bladder scan, he started on Flomax     Code Status:     Code Status Orders  (From admission, onward)           Start     Ordered   03/11/21 1630  Full code  Continuous        03/11/21 1631           Code Status History     Date Active Date Inactive Code Status Order ID Comments User Context   03/19/2017 1246 03/20/2017 1547 Full Code [id number]  Susa Day, MD Inpatient   10/22/2016 1149 10/23/2016 1624 Full Code [id number]  Susa Day, MD Inpatient   04/22/2016 1510 04/22/2016 2212 Full Code [id number]  Serafina Mitchell, MD Inpatient      Family Communication: None at bedside Disposition Plan: Status is: Inpatient  Dispo: The patient is from: Home               Anticipated d/c is to: Home               Patient is not medically ready for discharge    Difficult to place patient.  No  Consultants: Orthopedic surgery   [facility]  [facility]

## 2021-03-16 ENCOUNTER — Inpatient Hospital Stay: Payer: Medicare Other

## 2021-03-16 DIAGNOSIS — S22080S Wedge compression fracture of T11-T12 vertebra, sequela: Secondary | ICD-10-CM | POA: Diagnosis not present

## 2021-03-16 DIAGNOSIS — F1010 Alcohol abuse, uncomplicated: Secondary | ICD-10-CM | POA: Diagnosis not present

## 2021-03-16 DIAGNOSIS — R338 Other retention of urine: Secondary | ICD-10-CM

## 2021-03-16 MED ORDER — MAGNESIUM CITRATE PO SOLN
1.0000 | Freq: Once | ORAL | Status: AC
  Administered 2021-03-16: 1 via ORAL
  Filled 2021-03-16: qty 296

## 2021-03-16 MED ORDER — FLEET ENEMA 7-19 GM/118ML RE ENEM
1.0000 | ENEMA | Freq: Once | RECTAL | Status: AC
  Administered 2021-03-16: 1 via RECTAL

## 2021-03-16 MED ORDER — CHLORHEXIDINE GLUCONATE CLOTH 2 % EX PADS
6.0000 | MEDICATED_PAD | Freq: Every day | CUTANEOUS | Status: DC
  Administered 2021-03-16 – 2021-03-17 (×2): 6 via TOPICAL

## 2021-03-16 NOTE — Progress Notes (Signed)
PT Cancellation Note  Patient Details Name: Mark Stevenson MRN: [id number] DOB: Nov 22, 1955   Cancelled Treatment:    Reason Eval/Treat Not Completed: Patient at procedure or test/unavailable (Pt out of room upon PT arrival for x-ray of abdomen. PT will follow-up as able.)   [name] 03/16/2021, 11:58 AM

## 2021-03-16 NOTE — Progress Notes (Signed)
Physical Therapy Treatment Patient Details Name: Mark Stevenson MRN: [id number] DOB: 1956/04/08 Today's Date: 03/16/2021    History of Present Illness Pt admitted for fall tripping over dog and unable to get up off floor. Laid in floor for a few days and developed rhabdomyelitis. HIstory includes COPD, HTN, and arthritis. Pt with acute T11 and L1/2 compression fx and is s/p kypho 03/13/21.    PT Comments    Pt received seated in the recliner demanding to be out back into bed and refusing to walk or complete any PT exercises. Pt agreeable to allow PT to assist with transfer to the bed. Completed sit<>stand transfer from the recliner with CGA, with Vcs provided for appropriate RW use. Pt completed transfer to seated EOB with SBA and Vcs for appropriate hand placement on bed. Pt completed sit to supine transfer with SBA, Vcs for sequencing. Pt verbalized increased pain, RN notified. Pt left supine in bed with call bell within reach.   Follow Up Recommendations  Home health PT;Supervision/Assistance - 24 hour     Equipment Recommendations  None recommended by PT    Recommendations for Other Services       Precautions / Restrictions Precautions Precautions: Fall Restrictions Weight Bearing Restrictions: No    Mobility  Bed Mobility Overal bed mobility: Needs Assistance                  Transfers Overall transfer level: Needs assistance Equipment used: Rolling walker (2 wheeled) Transfers: Sit to/from Stand           General transfer comment: Pt requiring CGA for sit<>stand transfer from recliner, VCs for appropriate RW use. Pt verbalizing increased low back and refusing to complete any ambulation or exercises with PT today.  Ambulation/Gait             General Gait Details: Pt refusing to ambulate today, 2/2 LBP and he is not willing to complete any other PT intereventions other than transferring back into his bed at this time.   Stairs              Wheelchair Mobility    Modified Rankin (Stroke Patients Only)       Balance Overall balance assessment: Needs assistance;History of Falls Sitting-balance support: Feet supported Sitting balance-Leahy Scale: Good Sitting balance - Comments: Seated in recliner upon arrival and demonstrated dynamic reaching activities safely wiht no LOB   Standing balance support: During functional activity Standing balance-Leahy Scale: Fair Standing balance comment: Pt safe with standing and BIL UE asssist on RW                            Cognition Arousal/Alertness: Awake/alert Behavior During Therapy: WFL for tasks assessed/performed Overall Cognitive Status: Within Functional Limits for tasks assessed                                 General Comments: B UE tremors noted      Exercises      General Comments        Pertinent Vitals/Pain Pain Assessment: Faces Faces Pain Scale: Hurts even more Pain Location: low back Pain Descriptors / Indicators: Aching;Dull Pain Intervention(s): Repositioned    Home Living                      Prior Function  PT Goals (current goals can now be found in the care plan section) Acute Rehab PT Goals Patient Stated Goal: to go home PT Goal Formulation: With patient Time For Goal Achievement: 03/28/21 Potential to Achieve Goals: Good Additional Goals Additional Goal #1: Pt will be able to perform bed mobility/transfers with supervision and safe technique in order to improve functional independence Progress towards PT goals: Progressing toward goals    Frequency    7X/week      PT Plan Current plan remains appropriate    Co-evaluation              AM-PAC PT "6 Clicks" Mobility   Outcome Measure  Help needed turning from your back to your side while in a flat bed without using bedrails?: A Little Help needed moving from lying on your back to sitting on the side of a flat bed  without using bedrails?: A Little Help needed moving to and from a bed to a chair (including a wheelchair)?: A Little Help needed standing up from a chair using your arms (e.g., wheelchair or bedside chair)?: A Little Help needed to walk in hospital room?: A Little Help needed climbing 3-5 steps with a railing? : A Little 6 Click Score: 18    End of Session Equipment Utilized During Treatment: Gait belt Activity Tolerance: Patient limited by pain Patient left: with nursing/sitter in room Nurse Communication: Mobility status PT Visit Diagnosis: Unsteadiness on feet (R26.81);Muscle weakness (generalized) (M62.81);History of falling (Z91.81);Difficulty in walking, not elsewhere classified (R26.2);Pain     Time: 0071-2197 PT Time Calculation (min) (ACUTE ONLY): 18 min  Charges:  $Therapeutic Activity: 8-22 mins                     Duanne Guess, PT, DPT 03/16/21, 4:27 PM    Mark Stevenson 03/16/2021, 4:24 PM

## 2021-03-17 DIAGNOSIS — R338 Other retention of urine: Secondary | ICD-10-CM | POA: Diagnosis not present

## 2021-03-17 DIAGNOSIS — J441 Chronic obstructive pulmonary disease with (acute) exacerbation: Secondary | ICD-10-CM | POA: Diagnosis not present

## 2021-03-17 DIAGNOSIS — F1010 Alcohol abuse, uncomplicated: Secondary | ICD-10-CM | POA: Diagnosis not present

## 2021-03-17 DIAGNOSIS — W19XXXD Unspecified fall, subsequent encounter: Secondary | ICD-10-CM | POA: Diagnosis not present

## 2021-03-17 LAB — BASIC METABOLIC PANEL
Anion gap: 7 (ref 5–15)
BUN: 21 mg/dL (ref 8–23)
CO2: 28 mmol/L (ref 22–32)
Calcium: 9.4 mg/dL (ref 8.9–10.3)
Chloride: 102 mmol/L (ref 98–111)
Creatinine, Ser: 0.81 mg/dL (ref 0.61–1.24)
GFR, Estimated: >60 mL/min (ref >60)
Glucose, Bld: 106 mg/dL — ABNORMAL HIGH (ref 70–99)
Potassium: 4.1 mmol/L (ref 3.5–5.1)
Sodium: 137 mmol/L (ref 135–145)

## 2021-03-17 LAB — CBC
HCT: 33.6 % — ABNORMAL LOW (ref 39.0–52.0)
Hemoglobin: 11.5 g/dL — ABNORMAL LOW (ref 13.0–17.0)
MCH: 36.2 pg — ABNORMAL HIGH (ref 26.0–34.0)
MCHC: 34.2 g/dL (ref 30.0–36.0)
MCV: 105.7 fL — ABNORMAL HIGH (ref 80.0–100.0)
Platelets: 178 10*3/uL (ref 150–400)
RBC: 3.18 MIL/uL — ABNORMAL LOW (ref 4.22–5.81)
RDW: 12.3 % (ref 11.5–15.5)
WBC: 10.6 10*3/uL — ABNORMAL HIGH (ref 4.0–10.5)
nRBC: 0 % (ref 0.0–0.2)

## 2021-03-17 LAB — PHOSPHORUS: Phosphorus: 5.1 mg/dL — ABNORMAL HIGH (ref 2.5–4.6)

## 2021-03-17 LAB — MAGNESIUM: Magnesium: 2.1 mg/dL (ref 1.7–2.4)

## 2021-03-17 MED ORDER — ACETAMINOPHEN 325 MG PO TABS
325.0000 mg | ORAL_TABLET | Freq: Four times a day (QID) | ORAL | Status: DC | PRN
  Administered 2021-03-17 – 2021-03-18 (×2): 325 mg via ORAL
  Filled 2021-03-17 (×2): qty 1

## 2021-03-17 MED ORDER — IPRATROPIUM-ALBUTEROL 0.5-2.5 (3) MG/3ML IN SOLN
3.0000 mL | RESPIRATORY_TRACT | Status: DC
  Administered 2021-03-17 – 2021-03-18 (×5): 3 mL via RESPIRATORY_TRACT
  Filled 2021-03-17 (×4): qty 3

## 2021-03-17 MED ORDER — HYDROCODONE-ACETAMINOPHEN 5-325 MG PO TABS
1.0000 | ORAL_TABLET | Freq: Four times a day (QID) | ORAL | Status: DC | PRN
Start: 2021-03-17 — End: 2021-03-19
  Administered 2021-03-17 – 2021-03-19 (×7): 1 via ORAL
  Filled 2021-03-17 (×7): qty 1

## 2021-03-17 MED ORDER — HYDROCODONE-ACETAMINOPHEN 5-325 MG PO TABS: 1.0000 | ORAL_TABLET | ORAL | Status: DC | PRN

## 2021-03-17 MED ORDER — POLYVINYL ALCOHOL 1.4 % OP SOLN
2.0000 [drp] | OPHTHALMIC | Status: DC | PRN
  Filled 2021-03-17: qty 15

## 2021-03-17 MED ORDER — METHYLPREDNISOLONE SODIUM SUCC 40 MG IJ SOLR
40.0000 mg | Freq: Three times a day (TID) | INTRAMUSCULAR | Status: DC
  Administered 2021-03-17 – 2021-03-18 (×4): 40 mg via INTRAVENOUS
  Filled 2021-03-17 (×4): qty 1

## 2021-03-17 MED ORDER — METOCLOPRAMIDE HCL 5 MG/ML IJ SOLN
5.0000 mg | Freq: Three times a day (TID) | INTRAMUSCULAR | Status: AC
  Administered 2021-03-17 – 2021-03-18 (×3): 5 mg via INTRAVENOUS
  Filled 2021-03-17 (×3): qty 2

## 2021-03-17 NOTE — Progress Notes (Signed)
Patient ID: Mark Stevenson, male   DOB: 02-25-1956, 65 y.o.   MRN: [id number] [facility] PROGRESS NOTE  Mark Stevenson [id number] DOB: 1955-11-11 DOA: 03/11/2021 PCP: Alroy Dust, L.Marlou Sa, MD     This note is a late entry, this note is regarding patient evaluation and exam on 03/16/2021, service date of 03/16/2021.  HPI  Brief history.  Patient admitted 03/11/2021 with a fall and being on the floor since Friday.  Patient was found to have rhabdomyolysis and 2 compression fractures.  Past medical history of alcohol abuse, thrombocytopenia, COPD.  Work-up significant for T11, L2 fracture, status post kyphoplasty 6/15.  As well he did require CIWA protocol, patient did have urinary retention as well.  Subjective:  No further urinary retention overnight, had couple of bladder scan readings 400s and 250 range, but he was able to urinate after, patient reports no bowel movements for few days and some abdominal discomfort.    Objective: Vitals:   03/17/21 0406 03/17/21 0801  BP: 104/77 100/73  Pulse: 96 95  Resp: 17 18  Temp: (!) 97.5 F (36.4 C) 97.7 F (36.5 C)  SpO2: 93% 93%    Intake/Output Summary (Last 24 hours) at 03/17/2021 1247 Last data filed at 03/17/2021 1024 Gross per 24 hour  Intake 120 ml  Output 1800 ml  Net -1680 ml   Filed Weights   03/11/21 1321  Weight: 77.1 kg     Exam:  Awake Alert, Oriented X 3, No new F.N deficits, Normal affect Symmetrical Chest wall movement, Good air movement bilaterally, CTAB RRR,No Gallops,Rubs or new Murmurs, No Parasternal Heave Bowel sounds present, but diminished, he had suprapubic tenderness and mild distention, but no rebound, no guarding.   No Cyanosis, Clubbing or edema, No new Rash or bruise          Data Reviewed: Basic Metabolic Panel: Recent Labs  Lab 03/11/21 1332 03/12/21 0344 03/13/21 0406 03/14/21 0351 03/17/21 0414  NA 129* 132* 136 136 137  K 3.6 3.2* 3.1* 4.4 4.1  CL 90* 95* 101 106 102   CO2 27 28 28 24 28   GLUCOSE 107* 103* 116* 205* 106*  BUN 23 22 20 20 21   CREATININE 0.96 0.83 0.86 0.92 0.81  CALCIUM 9.3 9.0 9.3 9.3 9.4  MG 1.7  --  2.1 1.7 2.1  PHOS 3.7  --  3.4 2.7 5.1*    CBC: Recent Labs  Lab 03/11/21 1332 03/12/21 0344 03/14/21 0351 03/17/21 0414  WBC 14.1* 9.9 7.8 10.6*  HGB 13.7 13.1 11.6* 11.5*  HCT 37.8* 35.9* 33.6* 33.6*  MCV 100.8* 99.7 105.7* 105.7*  PLT 102* 91* 123* 178   Cardiac Enzymes: Recent Labs  Lab 03/11/21 1332 03/12/21 0344 03/14/21 0351  CKTOTAL 3,230* 1,551* 406*     Recent Results (from the past 240 hour(s))  SARS CORONAVIRUS 2 (TAT 6-24 HRS) Nasopharyngeal     Status: None   Collection Time: 03/11/21  3:13 PM   Specimen: Nasopharyngeal  Result Value Ref Range Status   SARS Coronavirus 2 NEGATIVE NEGATIVE Final    Comment: (NOTE) SARS-CoV-2 target nucleic acids are NOT DETECTED.  The SARS-CoV-2 RNA is generally detectable in upper and lower respiratory specimens during the acute phase of infection. Negative results do not preclude SARS-CoV-2 infection, do not rule out co-infections with other pathogens, and should not be used as the sole basis for treatment or other patient management decisions. Negative results must be combined with clinical observations, patient history, and epidemiological information. The  expected result is Negative.  Fact Sheet for Patients: SugarRoll.be  Fact Sheet for Healthcare Providers: https://www.woods-mathews.com/  This test is not yet approved or cleared by the [place] FDA and  has been authorized for detection and/or diagnosis of SARS-CoV-2 by FDA under an Emergency Use Authorization (EUA). This EUA will remain  in effect (meaning this test can be used) for the duration of the COVID-19 declaration under Se ction 564(b)(1) of the Act, 21 U.S.C. section 360bbb-3(b)(1), unless the authorization is terminated or revoked  sooner.  Performed at [facility], [place] [address], [place], [place] [address]      Studies: DG Abd Portable 1V  Result Date: 03/16/2021 CLINICAL DATA:  Recent fall.  Abdominal pain EXAM: PORTABLE ABDOMEN - 1 VIEW COMPARISON:  None. FINDINGS: Moderately distended colon diffusely. Retained stool in the right colon. Small bowel nondilated. Small amount of gas in the rectum. Left hip replacement. 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 Cement vertebral augmentation T11 and L2. IMPRESSION: Moderately distended colon compatible with ileus. Retained stool in the right colon. Electronically Signed   By: Franchot Gallo M.D.   On: 03/16/2021 12:15    Scheduled Meds:  calcium carbonate  1 tablet Oral TID WC   Chlorhexidine Gluconate Cloth  6 each Topical Daily   cholecalciferol  5,000 Units Oral Daily   folic acid  1 mg Oral Daily   ipratropium-albuterol  3 mL Nebulization Q4H   losartan  100 mg Oral Daily   methylPREDNISolone (SOLU-MEDROL) injection  40 mg Intravenous Q8H   mometasone-formoterol  2 puff Inhalation BID   multivitamin with minerals  1 tablet Oral Daily   nicotine  21 mg Transdermal Daily   polyethylene glycol  17 g Oral Daily   senna-docusate  2 tablet Oral BID   tamsulosin  0.4 mg Oral QPC supper   thiamine  100 mg Oral Daily   Continuous Infusions:     Assessment/Plan:  Acute rhabdomyolysis.  CK trending down which is reassuring, it is down to 406 . 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  MRI reviewed and T11 has 80% vertebral body height loss.  S/p kyphoplasty by Dr. Rudene Christians  on 6/15, continue with as needed pain medication, PT/OT consulted. Hypomagnesemia and hypokalemia.  Repleted,  Hyponatremia likely secondary to alcohol.  Improving with IV hydration Alcohol abuse on alcohol withdrawal protocol.  Thiamine.  Some tremors today, he reports drinks 1-1/2 gallon every week of vodka, no evidence of withdrawals, tremors much improved, he is  currently off Librium and CIWA protocol.  Thrombocytopenia secondary to chronic alcohol use.  hepatitis C nonreactive Constipation.  Patient with some abdominal tenderness today, reports constipation for few days with no bowel movement, I will obtain stat abdominal x-ray, and start on laxatives.   COPD   Continue albuterol nebulizer and Dulera. Essential hypertension on Cozaar Tobacco abuse: Counseled Urinary retention, he required in and out x2, will repeat bladder scan at 10 PM, if he has another episode of retention then will insert Foley catheter.  Addendum: Nominal x-ray showing ileus which is compatible with physical exam and history, we will give him magnesium citrate to have some bowel movements, and once he is having good bowel movement we will give him some scheduled IV Reglan as well, and I have discussed with staff we will try to ambulate the patient more frequently today, as well he is having urinary retention, this is the third event where he required 2 previous In-N-Out, post  void scan showing 16, so will insert Foley catheter as well.    Code Status:     Code Status Orders  (From admission, onward)           Start     Ordered   03/11/21 1630  Full code  Continuous        03/11/21 1631           Code Status History     Date Active Date Inactive Code Status Order ID Comments User Context   03/19/2017 1246 03/20/2017 1547 Full Code [id number]  Susa Day, MD Inpatient   10/22/2016 1149 10/23/2016 1624 Full Code [id number]  Susa Day, MD Inpatient   04/22/2016 1510 04/22/2016 2212 Full Code [id number]  Serafina Mitchell, MD Inpatient      Family Communication: None at bedside Disposition Plan: Status is: Inpatient  Dispo: The patient is from: Home              Anticipated d/c is to: Home               Patient is not medically ready for discharge given recurrent urinary retention, and abd pain today.   Difficult to place patient.  No  Consultants: Orthopedic  surgery   [facility]  [facility]

## 2021-03-17 NOTE — Progress Notes (Signed)
Physical Therapy Treatment Patient Details Name: Mark Stevenson MRN: [id number] DOB: 21-Jan-1956 Today's Date: 03/17/2021    History of Present Illness Pt admitted for fall tripping over dog and unable to get up off floor. Laid in floor for a few days and developed rhabdomyelitis. HIstory includes COPD, HTN, and arthritis. Pt with acute T11 and L1/2 compression fx and is s/p kypho 03/13/21.    PT Comments    Pt in chair ready for session.  He is able to stand and walk x 1 lap with RW and min guard/assist.  He is resistant to stair training today "Tomorrow"  Encouraged need to practice as he has no rails at home and had difficulty last attempt but he continued to walk past PT gym.  At around 120' he takes a self initiated standing rest asking to go back to his room to sit.  When questioned he reported SOB was more of a limiting factor than general fatigue.  Once sitting sats 83% on room air with HR 132 (initial read in 150's but quickly went to low 130's so unsure of initial accuracy).  After several minutes he increases to 92% on room air with HR more stable.  Discussed with RN and MD.  Pt does express interest in STR "I hope I can go there when I get out of here".  Will continue with gait and stair training tomorrow.  Will also need to check O2 sats - as of today he would qualify for home O2.  He has no support at home and given stairs and continued challenges with respiratory status SNF may be appropriate to return pt to baseline.     Follow Up Recommendations  Home health PT;Supervision/Assistance - 24 hour - May need SNF depending on session tomorrow.  Will update in AM.     Equipment Recommendations  None recommended by PT    Recommendations for Other Services       Precautions / Restrictions Precautions Precautions: Fall Restrictions Weight Bearing Restrictions: No    Mobility  Bed Mobility               General bed mobility comments: in recliner before and after     Transfers Overall transfer level: Needs assistance Equipment used: Rolling walker (2 wheeled) Transfers: Sit to/from Stand Sit to Stand: Min assist            Ambulation/Gait Ambulation/Gait assistance: Min guard Gait Distance (Feet): 200 Feet Assistive device: Rolling walker (2 wheeled) Gait Pattern/deviations: Decreased step length - left;Decreased stance time - right;Trunk flexed;Narrow base of support     General Gait Details: gait limited by SOB   Stairs         General stair comments: declined stair training today "maybe tomorrow"   Wheelchair Mobility    Modified Rankin (Stroke Patients Only)       Balance Overall balance assessment: Needs assistance;History of Falls Sitting-balance support: Feet supported Sitting balance-Leahy Scale: Good Sitting balance - Comments: Seated in recliner upon arrival and demonstrated dynamic reaching activities safely wiht no LOB   Standing balance support: During functional activity Standing balance-Leahy Scale: Fair Standing balance comment: Pt safe with standing and BIL UE asssist on RW                            Cognition Arousal/Alertness: Awake/alert Behavior During Therapy: WFL for tasks assessed/performed Overall Cognitive Status: Within Functional Limits for tasks assessed  Exercises      General Comments        Pertinent Vitals/Pain Pain Assessment: 0-10 Pain Score: 3  Pain Location: low back Pain Descriptors / Indicators: Aching;Dull Pain Intervention(s): Limited activity within patient's tolerance;Monitored during session;Premedicated before session;Repositioned    Home Living                      Prior Function            PT Goals (current goals can now be found in the care plan section) Progress towards PT goals: Progressing toward goals    Frequency    7X/week      PT Plan Current plan remains  appropriate    Co-evaluation              AM-PAC PT "6 Clicks" Mobility   Outcome Measure  Help needed turning from your back to your side while in a flat bed without using bedrails?: A Little Help needed moving from lying on your back to sitting on the side of a flat bed without using bedrails?: A Little Help needed moving to and from a bed to a chair (including a wheelchair)?: A Little Help needed standing up from a chair using your arms (e.g., wheelchair or bedside chair)?: A Little Help needed to walk in hospital room?: A Little Help needed climbing 3-5 steps with a railing? : A Little 6 Click Score: 18    End of Session Equipment Utilized During Treatment: Gait belt Activity Tolerance: Patient limited by pain Patient left: with nursing/sitter in room Nurse Communication: Mobility status PT Visit Diagnosis: Unsteadiness on feet (R26.81);Muscle weakness (generalized) (M62.81);History of falling (Z91.81);Difficulty in walking, not elsewhere classified (R26.2);Pain     Time: 9201-0071 PT Time Calculation (min) (ACUTE ONLY): 12 min  Charges:  $Gait Training: 8-22 mins                    Chesley Noon, PTA 03/17/21, 9:39 AM , 9:33 AM

## 2021-03-17 NOTE — Progress Notes (Signed)
Patient ID: Mark Stevenson, male   DOB: [birth year], 65 y.o.   MRN: [id number] [facility] PROGRESS NOTE  Mark Stevenson [id number]:[id number] DOB: 07-19-1956 DOA: 03/11/2021 PCP: Alroy Dust, L.Marlou Sa, MD    HPI  Brief history.  Patient admitted 03/11/2021 with a fall and being on the floor since Friday.  Patient was found to have rhabdomyolysis and 2 compression fractures.  Past medical history of alcohol abuse, thrombocytopenia, COPD.  Work-up significant for T11, L2 fracture, status post kyphoplasty 6/15.  As well he did require CIWA protocol, patient did have urinary retention as well.  And he did have ileus 03/16/2021, on 6/19, he was hypoxic 83% with activity with PT due to COPD.  Subjective:  Patient required Foley catheter insertion yesterday given he has significant retention x3, chest x-ray showing ileus, but this has significantly improved with bowel movements, he was hypoxic 83% on room air, tachypneic 140 at the end of the physical therapy session today.  .   Objective: Vitals:   03/17/21 0406 03/17/21 0801  BP: 104/77 100/73  Pulse: 96 95  Resp: 17 18  Temp: (!) 97.5 F (36.4 C) 97.7 F (36.5 C)  SpO2: 93% 93%    Intake/Output Summary (Last 24 hours) at 03/17/2021 1254 Last data filed at 03/17/2021 1024 Gross per 24 hour  Intake 120 ml  Output 1800 ml  Net -1680 ml   Filed Weights   03/11/21 1321  Weight: 77.1 kg     Exam:  Awake Alert, Oriented X 3, No new F.N deficits, Normal affect Symmetrical Chest wall movement, Good air movement bilaterally, scattered wheezing RRR,No Gallops,Rubs or new Murmurs, No Parasternal Heave +ve B.Sounds, Abd Soft, distention has significantly subsided, No rebound - guarding or rigidity. No Cyanosis, Clubbing or edema, No new Rash or bruise          Data Reviewed: Basic Metabolic Panel: Recent Labs  Lab 03/11/21 1332 03/12/21 0344 03/13/21 0406 03/14/21 0351 03/17/21 0414  NA 129* 132* 136 136 137  K 3.6 3.2* 3.1*  4.4 4.1  CL 90* 95* 101 106 102  CO2 27 28 28 24 28   GLUCOSE 107* 103* 116* 205* 106*  BUN 23 22 20 20 21   CREATININE 0.96 0.83 0.86 0.92 0.81  CALCIUM 9.3 9.0 9.3 9.3 9.4  MG 1.7  --  2.1 1.7 2.1  PHOS 3.7  --  3.4 2.7 5.1*    CBC: Recent Labs  Lab 03/11/21 1332 03/12/21 0344 03/14/21 0351 03/17/21 0414  WBC 14.1* 9.9 7.8 10.6*  HGB 13.7 13.1 11.6* 11.5*  HCT 37.8* 35.9* 33.6* 33.6*  MCV 100.8* 99.7 105.7* 105.7*  PLT 102* 91* 123* 178   Cardiac Enzymes: Recent Labs  Lab 03/11/21 1332 03/12/21 0344 03/14/21 0351  CKTOTAL 3,230* 1,551* 406*     Recent Results (from the past 240 hour(s))  SARS CORONAVIRUS 2 (TAT 6-24 HRS) Nasopharyngeal     Status: None   Collection Time: 03/11/21  3:13 PM   Specimen: Nasopharyngeal  Result Value Ref Range Status   SARS Coronavirus 2 NEGATIVE NEGATIVE Final    Comment: (NOTE) SARS-CoV-2 target nucleic acids are NOT DETECTED.  The SARS-CoV-2 RNA is generally detectable in upper and lower respiratory specimens during the acute phase of infection. Negative results do not preclude SARS-CoV-2 infection, do not rule out co-infections with other pathogens, and should not be used as the sole basis for treatment or other patient management decisions. Negative results must be combined with clinical observations, patient history, and  epidemiological information. The expected result is Negative.  Fact Sheet for Patients: SugarRoll.be  Fact Sheet for Healthcare Providers: https://www.woods-mathews.com/  This test is not yet approved or cleared by the [place] FDA and  has been authorized for detection and/or diagnosis of SARS-CoV-2 by FDA under an Emergency Use Authorization (EUA). This EUA will remain  in effect (meaning this test can be used) for the duration of the COVID-19 declaration under Se ction 564(b)(1) of the Act, 21 U.S.C. section 360bbb-3(b)(1), unless the authorization is  terminated or revoked sooner.  Performed at [facility], [place] [address] [place], [place] [address]      Studies: DG Abd Portable 1V  Result Date: 03/16/2021 CLINICAL DATA:  Recent fall.  Abdominal pain EXAM: PORTABLE ABDOMEN - 1 VIEW COMPARISON:  None. FINDINGS: Moderately distended colon diffusely. Retained stool in the right colon. Small bowel nondilated. Small amount of gas in the rectum. Left hip replacement. 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 Cement vertebral augmentation T11 and L2. IMPRESSION: Moderately distended colon compatible with ileus. Retained stool in the right colon. Electronically Signed   By: Franchot Gallo M.D.   On: 03/16/2021 12:15    Scheduled Meds:  calcium carbonate  1 tablet Oral TID WC   Chlorhexidine Gluconate Cloth  6 each Topical Daily   cholecalciferol  5,000 Units Oral Daily   folic acid  1 mg Oral Daily   ipratropium-albuterol  3 mL Nebulization Q4H   losartan  100 mg Oral Daily   methylPREDNISolone (SOLU-MEDROL) injection  40 mg Intravenous Q8H   mometasone-formoterol  2 puff Inhalation BID   multivitamin with minerals  1 tablet Oral Daily   nicotine  21 mg Transdermal Daily   polyethylene glycol  17 g Oral Daily   senna-docusate  2 tablet Oral BID   tamsulosin  0.4 mg Oral QPC supper   thiamine  100 mg Oral Daily   Continuous Infusions:     Assessment/Plan:  Acute rhabdomyolysis.  CK trending down which is reassuring, it is down to 406 . 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  MRI reviewed and T11 has 80% vertebral body height loss.  S/p kyphoplasty by Dr. Rudene Christians  on 6/15, continue with as needed pain medication, PT/OT consulted. Hypomagnesemia and hypokalemia.  Repleted,  COPD exacerbation: As of 6/19, patient hypoxic 83% with activity with physical therapy, he is with with some wheezing today as well, he will be treated for COPD exacerbation, he will be started on Solu-Medrol 40 mg every 8 hours, I will  increase his scheduled duo nebs to every 4 hours. Hyponatremia likely secondary to alcohol.  Improved with IV hydration  alcohol abuse on alcohol withdrawal protocol.  Thiamine.  Some tremors today, he reports drinks 1-1/2 gallon every week of vodka, no evidence of withdrawals, tremors much improved, he is currently off Librium and CIWA protocol.  Thrombocytopenia secondary to chronic alcohol use.  hepatitis C nonreactive Ileus : Patient was noted with some abdominal distention 6/18, KUB showing evidence of ileus, morning abdominal distention has improved after receiving laxatives yesterday, discussed with staff, patient will be sitting up most of the day, he was encouraged to ambulate multiple times, I will keep on IV Reglan scheduled x24 hours to assist with that as well, will target potassium > and magnesium > 2 ..   Essential hypertension on Cozaar Tobacco abuse: Counseled Urinary retention, required Foley catheter insertion yesterday, started on Flomax, likely will attempt voiding trial tomorrow now his  constipation/ileus has improved and he is more ambulatory.       Code Status:     Code Status Orders  (From admission, onward)           Start     Ordered   03/11/21 1630  Full code  Continuous        03/11/21 1631           Code Status History     Date Active Date Inactive Code Status Order ID Comments User Context   03/19/2017 1246 03/20/2017 1547 Full Code [id number]  Susa Day, MD Inpatient   10/22/2016 1149 10/23/2016 1624 Full Code [id number]  Susa Day, MD Inpatient   04/22/2016 1510 04/22/2016 2212 Full Code [id number]  Serafina Mitchell, MD Inpatient      Family Communication: None at bedside Disposition Plan: Status is: Inpatient  Dispo: The patient is from: Home              Anticipated d/c is to: Home vs SNF              Patient is not medically ready for discharge given he is currently with COPD exacerbation   Difficult to place patient.   No  Consultants: Orthopedic surgery   [facility]  [facility]

## 2021-03-18 ENCOUNTER — Inpatient Hospital Stay: Payer: Medicare Other

## 2021-03-18 DIAGNOSIS — R338 Other retention of urine: Secondary | ICD-10-CM | POA: Diagnosis not present

## 2021-03-18 DIAGNOSIS — J441 Chronic obstructive pulmonary disease with (acute) exacerbation: Secondary | ICD-10-CM | POA: Diagnosis not present

## 2021-03-18 MED ORDER — POLYETHYLENE GLYCOL 3350 17 G PO PACK
34.0000 g | PACK | Freq: Once | ORAL | Status: AC
  Administered 2021-03-18: 34 g via ORAL
  Filled 2021-03-18: qty 2

## 2021-03-18 MED ORDER — IPRATROPIUM-ALBUTEROL 0.5-2.5 (3) MG/3ML IN SOLN
3.0000 mL | Freq: Three times a day (TID) | RESPIRATORY_TRACT | Status: DC
  Administered 2021-03-18 – 2021-03-19 (×2): 3 mL via RESPIRATORY_TRACT
  Filled 2021-03-18 (×2): qty 3

## 2021-03-18 MED ORDER — METHYLPREDNISOLONE SODIUM SUCC 40 MG IJ SOLR
40.0000 mg | Freq: Two times a day (BID) | INTRAMUSCULAR | Status: DC
  Administered 2021-03-18 – 2021-03-19 (×2): 40 mg via INTRAVENOUS
  Filled 2021-03-18 (×2): qty 1

## 2021-03-18 MED ORDER — PANTOPRAZOLE SODIUM 40 MG PO TBEC
40.0000 mg | DELAYED_RELEASE_TABLET | Freq: Every day | ORAL | Status: DC
  Administered 2021-03-18 – 2021-03-19 (×2): 40 mg via ORAL
  Filled 2021-03-18 (×2): qty 1

## 2021-03-18 NOTE — Progress Notes (Signed)
SATURATION QUALIFICATIONS: (This note is used to comply with regulatory documentation for home oxygen)  Patient Saturations on Room Air at Rest = 90%  Patient Saturations on Room Air while Ambulating = 83%  Patient Saturations on 2 Liters of oxygen while Ambulating = 96%  Please briefly explain why patient needs home oxygen: Decreased oxygen saturation during exertion

## 2021-03-18 NOTE — Progress Notes (Signed)
Patient ID: Mark Stevenson, male   DOB: 1956/07/18, 65 y.o.   MRN: [id number] [facility] PROGRESS NOTE  Mark Stevenson VQM:[id number] DOB: Nov 30, 1955 DOA: 03/11/2021 PCP: Alroy Dust, L.Marlou Sa, MD    HPI  Brief history.  Patient admitted 03/11/2021 with a fall and being on the floor since Friday.  Patient was found to have rhabdomyolysis and 2 compression fractures.  Past medical history of alcohol abuse, thrombocytopenia, COPD.  Work-up significant for T11, L2 fracture, status post kyphoplasty 6/15.  As well he did require CIWA protocol, patient did have urinary retention as well.  And he did have ileus 03/16/2021, on 6/19, he was hypoxic 83% with activity with PT due to COPD.  Subjective:  Foley catheter was discontinued this morning, will monitor closely for retention, he is hypoxic with activity, needing him oxygen with ambulation, report dyspnea with activity.    Objective: Vitals:   03/18/21 1042 03/18/21 1258  BP: 109/76 90/71  Pulse: (!) 102 (!) 110  Resp: 18 20  Temp: 97.9 F (36.6 C) 97.8 F (36.6 C)  SpO2: 95% 96%    Intake/Output Summary (Last 24 hours) at 03/18/2021 1411 Last data filed at 03/18/2021 1403 Gross per 24 hour  Intake 720 ml  Output 1050 ml  Net -330 ml   Filed Weights   03/11/21 1321  Weight: 77.1 kg     Exam:  Awake Alert, Oriented X 3, No new F.N deficits, Normal affect Symmetrical Chest wall movement, Good air movement bilaterally, scattered wheezing Mildly tachycardic,No Gallops,Rubs or new Murmurs, No Parasternal Heave +ve B.Sounds, Abd Soft, No tenderness, No rebound - guarding or rigidity. No Cyanosis, Clubbing or edema, No new Rash or bruise      Data Reviewed: Basic Metabolic Panel: Recent Labs  Lab 03/12/21 0344 03/13/21 0406 03/14/21 0351 03/17/21 0414  NA 132* 136 136 137  K 3.2* 3.1* 4.4 4.1  CL 95* 101 106 102  CO2 28 28 24 28   GLUCOSE 103* 116* 205* 106*  BUN 22 20 20 21   CREATININE 0.83 0.86 0.92 0.81   CALCIUM 9.0 9.3 9.3 9.4  MG  --  2.1 1.7 2.1  PHOS  --  3.4 2.7 5.1*    CBC: Recent Labs  Lab 03/12/21 0344 03/14/21 0351 03/17/21 0414  WBC 9.9 7.8 10.6*  HGB 13.1 11.6* 11.5*  HCT 35.9* 33.6* 33.6*  MCV 99.7 105.7* 105.7*  PLT 91* 123* 178   Cardiac Enzymes: Recent Labs  Lab 03/12/21 0344 03/14/21 0351  CKTOTAL 1,551* 406*     Recent Results (from the past 240 hour(s))  SARS CORONAVIRUS 2 (TAT 6-24 HRS) Nasopharyngeal     Status: None   Collection Time: 03/11/21  3:13 PM   Specimen: Nasopharyngeal  Result Value Ref Range Status   SARS Coronavirus 2 NEGATIVE NEGATIVE Final    Comment: (NOTE) SARS-CoV-2 target nucleic acids are NOT DETECTED.  The SARS-CoV-2 RNA is generally detectable in upper and lower respiratory specimens during the acute phase of infection. Negative results do not preclude SARS-CoV-2 infection, do not rule out co-infections with other pathogens, and should not be used as the sole basis for treatment or other patient management decisions. Negative results must be combined with clinical observations, patient history, and epidemiological information. The expected result is Negative.  Fact Sheet for Patients: SugarRoll.be  Fact Sheet for Healthcare Providers: https://www.woods-mathews.com/  This test is not yet approved or cleared by the [place] FDA and  has been authorized for detection and/or diagnosis of  SARS-CoV-2 by FDA under an Emergency Use Authorization (EUA). This EUA will remain  in effect (meaning this test can be used) for the duration of the COVID-19 declaration under Se ction 564(b)(1) of the Act, 21 U.S.C. section 360bbb-3(b)(1), unless the authorization is terminated or revoked sooner.  Performed at [facility], [place] [address] [place], [place] [address]      Studies: US Abdomen Limited  Result Date: 03/18/2021 CLINICAL DATA:  Abdominal pain EXAM: LIMITED ABDOMEN  ULTRASOUND FOR ASCITES TECHNIQUE: Limited ultrasound survey for ascites was performed in all four abdominal quadrants. COMPARISON:  None. FINDINGS: No abdominal free fluid to suggest ascites. IMPRESSION: No abdominal ascites. Electronically Signed   By: Kathreen Devoid   On: 03/18/2021 12:35    Scheduled Meds:  calcium carbonate  1 tablet Oral TID WC   Chlorhexidine Gluconate Cloth  6 each Topical Daily   cholecalciferol  5,000 Units Oral Daily   folic acid  1 mg Oral Daily   ipratropium-albuterol  3 mL Nebulization Q4H   losartan  100 mg Oral Daily   methylPREDNISolone (SOLU-MEDROL) injection  40 mg Intravenous Q8H   mometasone-formoterol  2 puff Inhalation BID   multivitamin with minerals  1 tablet Oral Daily   nicotine  21 mg Transdermal Daily   senna-docusate  2 tablet Oral BID   tamsulosin  0.4 mg Oral QPC supper   thiamine  100 mg Oral Daily   Continuous Infusions:     Assessment/Plan:  Acute rhabdomyolysis.  CK trending down which is reassuring, it is down to 406 . 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  MRI reviewed and T11 has 80% vertebral body height loss.  S/p kyphoplasty by Dr. Rudene Christians  on 6/15, continue with as needed pain medication, PT/OT consulted. Hypomagnesemia and hypokalemia.  Repleted,  COPD exacerbation: As of 6/19, patient hypoxic 83% with activity with physical therapy, he is with with some wheezing as well, he is on IV steroids, will taper to 40 mg IV twice daily, continue with scheduled duo nebs, he remains hypoxic today with activity, so oxygen will be arranged for discharge.  Hyponatremia likely secondary to alcohol.  Improved with IV hydration  alcohol abuse on alcohol withdrawal protocol.  Thiamine.  Some tremors today, he reports drinks 1-1/2 gallon every week of vodka, no evidence of withdrawals, tremors much improved, he is currently off Librium and CIWA protocol.  Thrombocytopenia secondary to chronic alcohol use.  hepatitis C  nonreactive Ileus : Patient was noted with some abdominal distention 6/18, KUB showing evidence of ileus, morning abdominal distention has improved after receiving laxatives yesterday, discussed with staff, patient will be sitting up most of the day, he was encouraged to ambulate multiple times, I will keep on IV Reglan scheduled x24 hours to assist with that as well, will target potassium > and magnesium > 2 ..   Essential hypertension on Cozaar Tobacco abuse: Counseled Urinary retention, required Foley catheter insertion yesterday, started on Flomax, l will attempt voiding trial today as he is more ambulatory.    Code Status:     Code Status Orders  (From admission, onward)           Start     Ordered   03/11/21 1630  Full code  Continuous        03/11/21 1631           Code Status History     Date Active Date Inactive Code Status Order ID Comments  User Context   03/19/2017 1246 03/20/2017 1547 Full Code [id number]  Susa Day, MD Inpatient   10/22/2016 1149 10/23/2016 1624 Full Code [id number]  Susa Day, MD Inpatient   04/22/2016 1510 04/22/2016 2212 Full Code [id number]  Serafina Mitchell, MD Inpatient      Family Communication: None at bedside, discussed with sister by phone. Disposition Plan: Status is: Inpatient  Dispo: The patient is from: Home              Anticipated d/c is to: Home vs SNF              Patient is not medically ready for discharge given he is currently with COPD exacerbation   Difficult to place patient.  No  Consultants: Orthopedic surgery   [facility]  [facility]

## 2021-03-18 NOTE — Progress Notes (Signed)
Physical Therapy Treatment Patient Details Name: Mark Stevenson MRN: [id number] DOB: 09/30/1955 Today's Date: 03/18/2021    History of Present Illness Pt admitted for fall tripping over dog and unable to get up off floor. Laid in floor for a few days and developed rhabdomyelitis. HIstory includes COPD, HTN, and arthritis. Pt with acute T11 and L1/2 compression fx and is s/p kypho 03/13/21.    PT Comments     OK from MD to use O2 if needed.  Walked tor/from gym with RW and min guard with stair training to simulate home.    Session focused mostly on O2 needs and qualifying sats/education.  Room air at rest 90% HR 114 Gait on room air 100' 83% P 130 with slow recovery.  2 lpm donned.  After several minutes 96% on 2 lpm.   Decreased to 1 lpm and after stair training 85% and slow recovery.   Back to 2 lpm increased to 94%. Gait back to room on 2 lpm able to maintain 90%.  Pt stated he feels better with mobility today and hoped to discharge home.  Pt does qualify and exhibit need for O2 especially with mobility.  Will discuss needs with team.  Discussed safety with O2 at home as pt is a smoker and unsure of his further smoking plans.  Education stressed.   Follow Up Recommendations  Home health PT;Supervision/Assistance - 24 hour     Equipment Recommendations  None recommended by PT    Recommendations for Other Services       Precautions / Restrictions Precautions Precautions: Fall Restrictions Weight Bearing Restrictions: No    Mobility  Bed Mobility Overal bed mobility: Needs Assistance Bed Mobility: Sit to Supine       Sit to supine: Supervision        Transfers Overall transfer level: Needs assistance Equipment used: Rolling walker (2 wheeled) Transfers: Sit to/from Stand Sit to Stand: Min guard            Ambulation/Gait Ambulation/Gait assistance: Min guard Gait Distance (Feet): 100 Feet Assistive device: Rolling walker (2 wheeled) Gait  Pattern/deviations: Decreased step length - left;Decreased stance time - right;Trunk flexed;Narrow base of support     General Gait Details: gait limited by SOB   Stairs Stairs: Yes Stairs assistance: Min assist;+2 physical assistance Stair Management: No rails;Two rails Number of Stairs: 4 General stair comments: 2 step HHA, 2 steps with rail   Wheelchair Mobility    Modified Rankin (Stroke Patients Only)       Balance Overall balance assessment: Needs assistance;History of Falls Sitting-balance support: Feet supported Sitting balance-Leahy Scale: Good     Standing balance support: Bilateral upper extremity supported Standing balance-Leahy Scale: Fair Standing balance comment: Pt safe with standing and BIL UE asssist on RW                            Cognition Arousal/Alertness: Awake/alert Behavior During Therapy: WFL for tasks assessed/performed Overall Cognitive Status: Within Functional Limits for tasks assessed                                        Exercises      General Comments        Pertinent Vitals/Pain Pain Assessment: Faces Faces Pain Scale: Hurts a little bit Pain Location: low back Pain Descriptors / Indicators: Aching;Dull Pain Intervention(s):  Limited activity within patient's tolerance    Home Living                      Prior Function            PT Goals (current goals can now be found in the care plan section) Progress towards PT goals: Progressing toward goals    Frequency    7X/week      PT Plan Current plan remains appropriate    Co-evaluation              AM-PAC PT "6 Clicks" Mobility   Outcome Measure  Help needed turning from your back to your side while in a flat bed without using bedrails?: A Little Help needed moving from lying on your back to sitting on the side of a flat bed without using bedrails?: A Little Help needed moving to and from a bed to a chair (including  a wheelchair)?: A Little Help needed standing up from a chair using your arms (e.g., wheelchair or bedside chair)?: A Little Help needed to walk in hospital room?: A Little Help needed climbing 3-5 steps with a railing? : A Little 6 Click Score: 18    End of Session Equipment Utilized During Treatment: Gait belt Activity Tolerance: Patient limited by pain Patient left: with nursing/sitter in room Nurse Communication: Mobility status PT Visit Diagnosis: Unsteadiness on feet (R26.81);Muscle weakness (generalized) (M62.81);History of falling (Z91.81);Difficulty in walking, not elsewhere classified (R26.2);Pain     Time: 4462-8638 PT Time Calculation (min) (ACUTE ONLY): 25 min  Charges:  $Gait Training: 23-37 mins                    Chesley Noon, PTA 03/18/21, 9:44 AM , 9:38 AM

## 2021-03-18 NOTE — Progress Notes (Signed)
Pt scored a 7 on the RT protocol assessment. He is without wheezes. Nebulizer treatments are changed to TID and prn.

## 2021-03-19 DIAGNOSIS — S22080S Wedge compression fracture of T11-T12 vertebra, sequela: Secondary | ICD-10-CM | POA: Diagnosis not present

## 2021-03-19 DIAGNOSIS — S32020S Wedge compression fracture of second lumbar vertebra, sequela: Secondary | ICD-10-CM | POA: Diagnosis not present

## 2021-03-19 DIAGNOSIS — R338 Other retention of urine: Secondary | ICD-10-CM | POA: Diagnosis not present

## 2021-03-19 DIAGNOSIS — F1010 Alcohol abuse, uncomplicated: Secondary | ICD-10-CM | POA: Diagnosis not present

## 2021-03-19 MED ORDER — FOLIC ACID 1 MG PO TABS: 1.0000 mg | ORAL_TABLET | Freq: Every day | ORAL | 0 refills | Status: DC

## 2021-03-19 MED ORDER — CALCIUM CARBONATE 1250 (500 CA) MG PO TABS: 1.0000 | ORAL_TABLET | Freq: Three times a day (TID) | ORAL | 0 refills | Status: DC

## 2021-03-19 MED ORDER — PREDNISONE 10 MG PO TABS: ORAL_TABLET | ORAL | 0 refills | Status: DC

## 2021-03-19 MED ORDER — NICOTINE 21 MG/24HR TD PT24: 21.0000 mg | MEDICATED_PATCH | Freq: Every day | TRANSDERMAL | 0 refills | Status: DC

## 2021-03-19 MED ORDER — HYDROCODONE-ACETAMINOPHEN 5-325 MG PO TABS: 1.0000 | ORAL_TABLET | Freq: Four times a day (QID) | ORAL | 0 refills | Status: DC | PRN

## 2021-03-19 MED ORDER — PANTOPRAZOLE SODIUM 40 MG PO TBEC: 40.0000 mg | DELAYED_RELEASE_TABLET | Freq: Every day | ORAL | 0 refills | Status: DC

## 2021-03-19 MED ORDER — THIAMINE HCL 100 MG PO TABS: 100.0000 mg | ORAL_TABLET | Freq: Every day | ORAL | 0 refills | Status: DC

## 2021-03-19 MED ORDER — TAMSULOSIN HCL 0.4 MG PO CAPS: 0.4000 mg | ORAL_CAPSULE | Freq: Every day | ORAL | 0 refills | Status: DC

## 2021-03-19 NOTE — Care Management Important Message (Signed)
Important Message  Patient Details  Name: Mark Stevenson MRN: [id number] Date of Birth: May 24, 1956   Medicare Important Message Given:  Yes     Juliann Pulse A Sherise Geerdes 03/19/2021, 11:38 AM

## 2021-03-19 NOTE — Discharge Summary (Signed)
Physician Discharge Summary  KAYVEN ALDACO QJF:[id number] DOB: 1956/02/01 DOA: 03/11/2021  PCP: Alroy Dust, L.Marlou Sa, MD  Admit date: 03/11/2021 Discharge date: 03/19/2021  Admitted From:Home Disposition: Home   Recommendations for Outpatient Follow-up:  Follow up with PCP in 1-2 weeks Please obtain BMP/CBC in one week  Home Health: YES Equipment/Devices: (oxygen 2 L  Discharge Condition:Stable CODE STATUS:FULL Diet recommendation: Heart Healthy   Brief/Interim Summary:  Brief history.  Patient admitted 03/11/2021 with a fall and being on the floor since Friday.  Patient was found to have rhabdomyolysis and 2 compression fractures.  Past medical history of alcohol abuse, thrombocytopenia, COPD.  Work-up significant for T11, L2 fracture, status post kyphoplasty 6/15.  As well he did require CIWA protocol, patient did have urinary retention as well.  And he did have ileus 03/16/2021, on 6/19, he was hypoxic 83% with activity with PT due to COPD.  Acute rhabdomyolysis.  CK trending down which is reassuring, initially statins were held, it will be resumed on discharge  acute on chronic compression fracture T11 and compression fracture L2.  MRI reviewed and T11 has 80% vertebral body height loss.  S/p kyphoplasty by Dr. Rudene Christians  on 6/15, continue with as needed pain medication, PT/OT consulted.  Will have home health with PT.  Hypomagnesemia and hypokalemia.  Repleted,  COPD exacerbation: As of 6/19, patient hypoxic 83% with activity with physical therapy, he is with with some wheezing as well, he was treated with IV steroids, scheduled duo nebs with significant improvement, but he is needing home oxygen on discharge which has been arranged, he will be discharged on rapid prednisone taper, to continue home Advair and as needed albuterol.    Hyponatremia likely secondary to alcohol.  Improved with IV hydration  alcohol abuse : on alcohol withdrawal protocol.  Thiamine.  Some tremors today, he  reports drinks 1-1/2 gallon every week of vodka, no evidence of withdrawals, tremors much improved, required Librium and CIWA protocol.  He is with no evidence of withdrawals for last couple days, he will be discharged on thiamine and folic acid.  Thrombocytopenia secondary to chronic alcohol use.  hepatitis C nonreactive  Ileus : Resolved  Essential hypertension on Cozaar  Tobacco abuse: Counseled  Urinary retention, required Foley catheter insertion, discontinued yesterday, no recurrence of retention, he will be discharged on Flomax.      Discharge Diagnoses:  Principal Problem:   Fall Active Problems:   Closed compression fracture of second lumbar vertebra (HCC)   Rhabdomyolysis   COPD (chronic obstructive pulmonary disease) (HCC)   Hypertension   Thrombocytopenia (HCC)   No   Alcohol dependence ([place])   Closed wedge compression fracture of T11 vertebra (HCC)   Hypomagnesemia   Hypokalemia   Hyponatremia   Alcohol abuse   Acute urinary retention    Discharge Instructions  Discharge Instructions     Diet - low sodium heart healthy   Complete by: As directed    Increase activity slowly   Complete by: As directed    No wound care   Complete by: As directed       Allergies as of 03/19/2021   No Known Allergies      Medication List     STOP taking these medications    triamterene-hydrochlorothiazide 37.5-25 MG tablet Commonly known as: MAXZIDE-25   Vitamin D3 125 MCG (5000 UT) Tabs       TAKE these medications    Advair Diskus 250-50 MCG/ACT Aepb Generic drug: fluticasone-salmeterol Inhale 1 puff  into the lungs 2 (two) times daily.   calcium carbonate 1250 (500 Ca) MG tablet Commonly known as: OS-CAL - dosed in mg of elemental calcium Take 1 tablet (500 mg of elemental calcium total) by mouth 3 (three) times daily with meals.   cyclobenzaprine 10 MG tablet Commonly known as: FLEXERIL Take 1 tablet (10 mg total) by mouth 3 (three) times daily as  needed for muscle spasms.   folic acid 1 MG tablet Commonly known as: FOLVITE Take 1 tablet (1 mg total) by mouth daily. Start taking on: March 20, 2021   gabapentin 600 MG tablet Commonly known as: NEURONTIN Take 600 mg by mouth at bedtime.   HYDROcodone-acetaminophen 5-325 MG tablet Commonly known as: NORCO/VICODIN Take 1 tablet by mouth every 6 (six) hours as needed for severe pain. What changed:  how much to take how to take this when to take this reasons to take this additional instructions   loratadine 10 MG tablet Commonly known as: CLARITIN Take 10 mg by mouth daily as needed for allergies.   losartan 100 MG tablet Commonly known as: COZAAR Take 100 mg by mouth daily.   lovastatin 20 MG tablet Commonly known as: MEVACOR Take 20 mg by mouth daily.   nicotine 21 mg/24hr patch Commonly known as: NICODERM CQ - dosed in mg/24 hours Place 1 patch (21 mg total) onto the skin daily. Start taking on: March 20, 2021   pantoprazole 40 MG tablet Commonly known as: PROTONIX Take 1 tablet (40 mg total) by mouth daily. Start taking on: March 20, 2021   predniSONE 10 MG tablet Commonly known as: DELTASONE Take 40 mg oral daily x1 day, then 30 mg oral daily x1 day, then 20 mg oral daily x1 day, then 10 mg oral daily x1 day.  Then stop.   ProAir HFA 108 (90 Base) MCG/ACT inhaler Generic drug: albuterol Inhale 2 puffs into the lungs every 6 (six) hours as needed for shortness of breath or wheezing.   tamsulosin 0.4 MG Caps capsule Commonly known as: FLOMAX Take 1 capsule (0.4 mg total) by mouth daily after supper.   thiamine 100 MG tablet Take 1 tablet (100 mg total) by mouth daily. Start taking on: March 20, 2021   triamcinolone cream 0.1 % Commonly known as: KENALOG Apply 1 application topically daily as needed for rash.               Durable Medical Equipment  (From admission, onward)           Start     Ordered   03/18/21 1407  For home use only DME  oxygen  Once       Question Answer Comment  Length of Need 6 Months   Mode or (Route) Nasal cannula   Liters per Minute 2   Frequency Continuous (stationary and portable oxygen unit needed)   Oxygen conserving device Yes   Oxygen delivery system Gas      03/18/21 1407   03/18/21 1128  For home use only DME oxygen  Once       Question Answer Comment  Length of Need 6 Months   Mode or (Route) Nasal cannula   Liters per Minute 2   Frequency Continuous (stationary and portable oxygen unit needed)   Oxygen delivery system Gas      03/18/21 1127            Follow-up Information     Mitchell, L.Marlou Sa, MD Follow up in 1 week(s).  Specialty: Family Medicine Contact information: [address] [place] [address] [phone]                No Known Allergies  Consultations: Orthopedic Dr Rudene Christians   Procedures/Studies: DG Chest 2 View  Result Date: 03/11/2021 CLINICAL DATA:  Back pain after recent fall. EXAM: CHEST - 2 VIEW COMPARISON:  Chest x-ray dated August 27, 2017. FINDINGS: The heart size and mediastinal contours are within normal limits. Normal pulmonary vascularity. No focal consolidation, pleural effusion, or pneumothorax. Bilateral nipple shadows again noted. Acute on chronic T11 compression fracture as described on concurrent thoracic spine x-rays from same day. IMPRESSION: 1. No acute cardiopulmonary disease. 2. Acute on chronic T11 compression fracture as described on concurrent thoracic spine x-rays from same day. Electronically Signed   By: Titus Dubin M.D.   On: 03/11/2021 15:11   DG Thoracic Spine 2 View  Result Date: 03/11/2021 CLINICAL DATA:  Back pain after fall. EXAM: THORACIC SPINE 2 VIEWS COMPARISON:  Lumbar spine x-rays dated October 14, 2019. Chest x-ray dated August 27, 2017. FINDINGS: Progressive now severe T11 vertebral body height loss since prior study. Remaining thoracic vertebral body heights are preserved. Alignment  is normal. Disc spaces are relatively maintained. IMPRESSION: 1. Acute on chronic T11 compression fracture, with progressive now severe height loss compared to the prior study. Electronically Signed   By: Titus Dubin M.D.   On: 03/11/2021 15:09   DG Lumbar Spine 2-3 Views  Result Date: 03/13/2021 CLINICAL DATA:  T11, L2 kyphoplasty EXAM: LUMBAR SPINE - 2-3 VIEW; DG C-ARM 1-60 MIN COMPARISON:  03/12/2021 MRI FLUOROSCOPY TIME:  Fluoroscopy Time:  2 minutes 6 seconds Radiation Exposure Index (if provided by the fluoroscopic device): 47.9 mGy Number of Acquired Spot Images: 4 FINDINGS: Contrast laden cement is noted at T11 and L1 consistent with the recent history of kyphoplasty. IMPRESSION: T11 and L1 kyphoplasty. Electronically Signed   By: Inez Catalina M.D.   On: 03/13/2021 16:04   DG Lumbar Spine 2-3 Views  Result Date: 03/11/2021 CLINICAL DATA:  Low back pain after fall. EXAM: LUMBAR SPINE - 2-3 VIEW COMPARISON:  Lumbar spine x-rays dated October 14, 2019. FINDINGS: New mild L2 superior endplate compression fracture. Chronic mild L1 superior endplate compression fracture. Normal alignment. Unchanged mild multilevel lumbar spondylosis. Osteopenia. Prior left total hip arthroplasty. IMPRESSION: 1. New mild L2 superior endplate compression fracture, likely acute given clinical history. Correlate with point tenderness. 2. Chronic mild L1 compression fracture. Electronically Signed   By: Titus Dubin M.D.   On: 03/11/2021 15:06   MR THORACIC SPINE WO CONTRAST  Result Date: 03/12/2021 CLINICAL DATA:  Spinal fracture, lumbosacral, traumatic. EXAM: MRI THORACIC AND LUMBAR SPINE WITHOUT CONTRAST TECHNIQUE: Multiplanar and multiecho pulse sequences of the thoracic and lumbar spine were obtained without intravenous contrast. COMPARISON:  Radiographs March 11, 2021. FINDINGS: MRI THORACIC SPINE FINDINGS Alignment:  Exaggerated thoracic kyphosis. Vertebrae: T11 vertebral fracture with loss of approximately 80%  of vertebral body height centrally, progressed from radiographs performed in January 2021. Marrow edema is consistent with acute/subacute process. No retropulsion. No evidence of discitis or aggressive bone lesion. Cord:  Normal signal and morphology. Paraspinal and other soft tissues: Negative. Disc levels: Small posterior disc protrusion at T5-6, T6-7, T7-8, T8-9, T10-11 and T11-12. Mild facet degenerative changes at T9-10, T10-11 and T11-12. No significant spinal canal or neural foraminal stenosis at any level. MRI LUMBAR SPINE FINDINGS Segmentation:  Standard. Alignment: Small anterolisthesis of T12 over L1.  Small retrolisthesis of L1 over L2. Mild dextroconvex scoliosis of the lumbar spine. Vertebrae: Compression fracture of the superior endplate of L2 resulting in approximately 35% loss of vertebral body height. There is associated marrow edema, consistent with acute/subacute process. No retropulsion. No evidence of discitis or aggressive bone lesion. Mild chronic compression fracture of L1 with associated Schmorl node. Postsurgical changes from L3-4 and L4-5 laminectomy. Conus medullaris and cauda equina: Conus extends to the L1 level. Conus and cauda equina appear normal. Paraspinal and other soft tissues: A 2.7 cm right adrenal lesion with fatty content, may represent a myelolipoma. Disc levels: T12-L1: Shallow disc bulge and mild facet degenerative changes. No spinal canal or neural foraminal stenosis. L1-2: Disc bulge, mild facet degenerative changes and ligamentum flavum redundancy resulting in mild right and moderate left neural foraminal narrowing. No spinal canal stenosis. L2-3: Disc bulge, hypertrophic facet degenerative changes and ligamentum flavum redundancy resulting in mild narrowing of the bilateral subarticular zones, mild right and mild-to-moderate left neural foraminal narrowing. L3-4: Disc bulge, hypertrophic facet degenerative changes resulting in narrowing of the bilateral subarticular  zones, mild right and severe left neural foraminal narrowing. L4-5: Disc bulge, hypertrophic facet degenerative changes resulting in moderate bilateral neural foraminal narrowing, right greater than left. No spinal canal stenosis. L5-S1: Disc bulge, hypertrophic facet degenerative changes without significant spinal canal or neural foraminal stenosis. IMPRESSION: 1. T11 compression fracture with associated marrow edema, consistent with acute/subacute on chronic process. There is loss of approximately 80% of vertebral body height centrally. No retropulsion. 2. Acute/subacute compression fracture of the superior endplate of L2 with approximately 30% loss of vertebral body height. No retropulsion. 3. Degenerative changes of the thoracic spine without significant spinal canal or neural foraminal stenosis at any level. 4. Degenerative changes of the lumbar spine with multilevel neural foraminal narrowing, severe on the left at L3-4, moderate on the left at L1-2 and moderate bilaterally at L4-5. 5. 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 Electronically Signed   By: Pedro Earls M.D.   On: 03/12/2021 11:04   MR LUMBAR SPINE WO CONTRAST  Result Date: 03/12/2021 CLINICAL DATA:  Spinal fracture, lumbosacral, traumatic. EXAM: MRI THORACIC AND LUMBAR SPINE WITHOUT CONTRAST TECHNIQUE: Multiplanar and multiecho pulse sequences of the thoracic and lumbar spine were obtained without intravenous contrast. COMPARISON:  Radiographs March 11, 2021. FINDINGS: MRI THORACIC SPINE FINDINGS Alignment:  Exaggerated thoracic kyphosis. Vertebrae: T11 vertebral fracture with loss of approximately 80% of vertebral body height centrally, progressed from radiographs performed in January 2021. Marrow edema is consistent with acute/subacute process. No retropulsion. No evidence of discitis or aggressive bone lesion. Cord:  Normal signal and morphology. Paraspinal and other soft tissues:  Negative. Disc levels: Small posterior disc protrusion at T5-6, T6-7, T7-8, T8-9, T10-11 and T11-12. Mild facet degenerative changes at T9-10, T10-11 and T11-12. No significant spinal canal or neural foraminal stenosis at any level. MRI LUMBAR SPINE FINDINGS Segmentation:  Standard. Alignment: Small anterolisthesis of T12 over L1. Small retrolisthesis of L1 over L2. Mild dextroconvex scoliosis of the lumbar spine. Vertebrae: Compression fracture of the superior endplate of L2 resulting in approximately 35% loss of vertebral body height. There is associated marrow edema, consistent with acute/subacute process. No retropulsion. No evidence of discitis or aggressive bone lesion. Mild chronic compression fracture of L1 with associated Schmorl node. Postsurgical changes from L3-4 and L4-5 laminectomy. Conus medullaris and cauda equina: Conus extends to the L1 level. Conus and cauda equina  appear normal. Paraspinal and other soft tissues: A 2.7 cm right adrenal lesion with fatty content, may represent a myelolipoma. Disc levels: T12-L1: Shallow disc bulge and mild facet degenerative changes. No spinal canal or neural foraminal stenosis. L1-2: Disc bulge, mild facet degenerative changes and ligamentum flavum redundancy resulting in mild right and moderate left neural foraminal narrowing. No spinal canal stenosis. L2-3: Disc bulge, hypertrophic facet degenerative changes and ligamentum flavum redundancy resulting in mild narrowing of the bilateral subarticular zones, mild right and mild-to-moderate left neural foraminal narrowing. L3-4: Disc bulge, hypertrophic facet degenerative changes resulting in narrowing of the bilateral subarticular zones, mild right and severe left neural foraminal narrowing. L4-5: Disc bulge, hypertrophic facet degenerative changes resulting in moderate bilateral neural foraminal narrowing, right greater than left. No spinal canal stenosis. L5-S1: Disc bulge, hypertrophic facet degenerative changes  without significant spinal canal or neural foraminal stenosis. IMPRESSION: 1. T11 compression fracture with associated marrow edema, consistent with acute/subacute on chronic process. There is loss of approximately 80% of vertebral body height centrally. No retropulsion. 2. Acute/subacute compression fracture of the superior endplate of L2 with approximately 30% loss of vertebral body height. No retropulsion. 3. Degenerative changes of the thoracic spine without significant spinal canal or neural foraminal stenosis at any level. 4. Degenerative changes of the lumbar spine with multilevel neural foraminal narrowing, severe on the left at L3-4, moderate on the left at L1-2 and moderate bilaterally at L4-5. 5. 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 Electronically Signed   By: Pedro Earls M.D.   On: 03/12/2021 11:04   US Abdomen Limited  Result Date: 03/18/2021 CLINICAL DATA:  Abdominal pain EXAM: LIMITED ABDOMEN ULTRASOUND FOR ASCITES TECHNIQUE: Limited ultrasound survey for ascites was performed in all four abdominal quadrants. COMPARISON:  None. FINDINGS: No abdominal free fluid to suggest ascites. IMPRESSION: No abdominal ascites. Electronically Signed   By: Kathreen Devoid   On: 03/18/2021 12:35   DG Abd Portable 1V  Result Date: 03/16/2021 CLINICAL DATA:  Recent fall.  Abdominal pain EXAM: PORTABLE ABDOMEN - 1 VIEW COMPARISON:  None. FINDINGS: Moderately distended colon diffusely. Retained stool in the right colon. Small bowel nondilated. Small amount of gas in the rectum. Left hip replacement. 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 Cement vertebral augmentation T11 and L2. IMPRESSION: Moderately distended colon compatible with ileus. Retained stool in the right colon. Electronically Signed   By: Franchot Gallo M.D.   On: 03/16/2021 12:15   DG C-Arm 1-60 Min  Result Date: 03/13/2021 CLINICAL DATA:  T11, L2 kyphoplasty EXAM: LUMBAR SPINE - 2-3 VIEW;  DG C-ARM 1-60 MIN COMPARISON:  03/12/2021 MRI FLUOROSCOPY TIME:  Fluoroscopy Time:  2 minutes 6 seconds Radiation Exposure Index (if provided by the fluoroscopic device): 47.9 mGy Number of Acquired Spot Images: 4 FINDINGS: Contrast laden cement is noted at T11 and L1 consistent with the recent history of kyphoplasty. IMPRESSION: T11 and L1 kyphoplasty. Electronically Signed   By: Inez Catalina M.D.   On: 03/13/2021 16:04      Subjective:  Back pain is controlled, good bowel movements, no further urinary retention, urinated after ambulation  today, dyspnea significantly improved. Discharge Exam: Vitals:   03/19/21 0518 03/19/21 0807  BP: 131/90 (!) 119/96  Pulse: 87 (!) 101  Resp: 18 18  Temp: 98.3 F (36.8 C) 98 F (36.7 C)  SpO2: 98% 96%   Vitals:   03/18/21 2121 03/19/21 0010 03/19/21 0518 03/19/21 0807  BP:  115/77 116/83 131/90 (!) 119/96  Pulse: 94 84 87 (!) 101  Resp: 18 19 18 18   Temp: 97.6 F (36.4 C) (!) 97.5 F (36.4 C) 98.3 F (36.8 C) 98 F (36.7 C)  TempSrc: Oral Oral Oral Oral  SpO2: 96% 97% 98% 96%  Weight:      Height:        General: Pt is alert, awake, not in acute distress Cardiovascular: RRR, S1/S2 +, no rubs, no gallops Respiratory: CTA bilaterally, no wheezing, no rhonchi Abdominal: Soft, NT, ND, bowel sounds + Extremities: no edema, no cyanosis    The results of significant diagnostics from this hospitalization (including imaging, microbiology, ancillary and laboratory) are listed below for reference.     Microbiology: Recent Results (from the past 240 hour(s))  SARS CORONAVIRUS 2 (TAT 6-24 HRS) Nasopharyngeal     Status: None   Collection Time: 03/11/21  3:13 PM   Specimen: Nasopharyngeal  Result Value Ref Range Status   SARS Coronavirus 2 NEGATIVE NEGATIVE Final    Comment: (NOTE) SARS-CoV-2 target nucleic acids are NOT DETECTED.  The SARS-CoV-2 RNA is generally detectable in upper and lower respiratory specimens during the acute phase  of infection. Negative results do not preclude SARS-CoV-2 infection, do not rule out co-infections with other pathogens, and should not be used as the sole basis for treatment or other patient management decisions. Negative results must be combined with clinical observations, patient history, and epidemiological information. The expected result is Negative.  Fact Sheet for Patients: SugarRoll.be  Fact Sheet for Healthcare Providers: https://www.woods-mathews.com/  This test is not yet approved or cleared by the [place] FDA and  has been authorized for detection and/or diagnosis of SARS-CoV-2 by FDA under an Emergency Use Authorization (EUA). This EUA will remain  in effect (meaning this test can be used) for the duration of the COVID-19 declaration under Se ction 564(b)(1) of the Act, 21 U.S.C. section 360bbb-3(b)(1), unless the authorization is terminated or revoked sooner.  Performed at [facility], [place] [address], [place], [place] [address]      Labs: BNP (last 3 results) No results for input(s): BNP in the last 8760 hours. Basic Metabolic Panel: Recent Labs  Lab 03/13/21 0406 03/14/21 0351 03/17/21 0414  NA 136 136 137  K 3.1* 4.4 4.1  CL 101 106 102  CO2 28 24 28   GLUCOSE 116* 205* 106*  BUN 20 20 21   CREATININE 0.86 0.92 0.81  CALCIUM 9.3 9.3 9.4  MG 2.1 1.7 2.1  PHOS 3.4 2.7 5.1*   Liver Function Tests: No results for input(s): AST, ALT, ALKPHOS, BILITOT, PROT, ALBUMIN in the last 168 hours. No results for input(s): LIPASE, AMYLASE in the last 168 hours. No results for input(s): AMMONIA in the last 168 hours. CBC: Recent Labs  Lab 03/14/21 0351 03/17/21 0414  WBC 7.8 10.6*  HGB 11.6* 11.5*  HCT 33.6* 33.6*  MCV 105.7* 105.7*  PLT 123* 178   Cardiac Enzymes: Recent Labs  Lab 03/14/21 0351  CKTOTAL 406*   BNP: Invalid input(s): POCBNP CBG: Recent Labs  Lab 03/13/21 0816 03/13/21 1659   GLUCAP 132* 130*   D-Dimer No results for input(s): DDIMER in the last 72 hours. Hgb A1c No results for input(s): HGBA1C in the last 72 hours. Lipid Profile No results for input(s): CHOL, HDL, LDLCALC, TRIG, CHOLHDL, LDLDIRECT in the last 72 hours. Thyroid function studies No results for input(s): TSH, T4TOTAL, T3FREE, THYROIDAB in the last 72 hours.  Invalid  input(s): FREET3 Anemia work up No results for input(s): VITAMINB12, FOLATE, FERRITIN, TIBC, IRON, RETICCTPCT in the last 72 hours. Urinalysis    Component Value Date/Time   COLORURINE AMBER (A) 03/11/2021 1415   APPEARANCEUR CLEAR (A) 03/11/2021 1415   LABSPEC 1.017 03/11/2021 1415   PHURINE 5.0 03/11/2021 1415   GLUCOSEU NEGATIVE 03/11/2021 1415   HGBUR SMALL (A) 03/11/2021 1415   BILIRUBINUR NEGATIVE 03/11/2021 1415   KETONESUR 20 (A) 03/11/2021 1415   PROTEINUR 30 (A) 03/11/2021 1415   UROBILINOGEN 0.2 04/01/2011 1145   NITRITE NEGATIVE 03/11/2021 1415   LEUKOCYTESUR NEGATIVE 03/11/2021 1415   Sepsis Labs Invalid input(s): PROCALCITONIN,  WBC,  LACTICIDVEN Microbiology Recent Results (from the past 240 hour(s))  SARS CORONAVIRUS 2 (TAT 6-24 HRS) Nasopharyngeal     Status: None   Collection Time: 03/11/21  3:13 PM   Specimen: Nasopharyngeal  Result Value Ref Range Status   SARS Coronavirus 2 NEGATIVE NEGATIVE Final    Comment: (NOTE) SARS-CoV-2 target nucleic acids are NOT DETECTED.  The SARS-CoV-2 RNA is generally detectable in upper and lower respiratory specimens during the acute phase of infection. Negative results do not preclude SARS-CoV-2 infection, do not rule out co-infections with other pathogens, and should not be used as the sole basis for treatment or other patient management decisions. Negative results must be combined with clinical observations, patient history, and epidemiological information. The expected result is Negative.  Fact Sheet for  Patients: SugarRoll.be  Fact Sheet for Healthcare Providers: https://www.woods-mathews.com/  This test is not yet approved or cleared by the [place] FDA and  has been authorized for detection and/or diagnosis of SARS-CoV-2 by FDA under an Emergency Use Authorization (EUA). This EUA will remain  in effect (meaning this test can be used) for the duration of the COVID-19 declaration under Se ction 564(b)(1) of the Act, 21 U.S.C. section 360bbb-3(b)(1), unless the authorization is terminated or revoked sooner.  Performed at [facility], [place] [address] [place], [place] [address]      Time coordinating discharge: Over 30 minutes  SIGNED:   Phillips Climes, MD  [facility] 03/19/2021, 11:15 AM Pager   If 7PM-7AM, please contact night-coverage [email or web address] Password [email or web address]

## 2021-03-19 NOTE — Progress Notes (Signed)
Physical Therapy Treatment Patient Details Name: Mark Stevenson MRN: [id number] DOB: 1956-02-24 Today's Date: 03/19/2021    History of Present Illness Pt admitted for fall tripping over dog and unable to get up off floor. Laid in floor for a few days and developed rhabdomyelitis. HIstory includes COPD, HTN, and arthritis. Pt with acute T11 and L1/2 compression fx and is s/p kypho 03/13/21.    PT Comments    Pt is making good progress towards goals with ability to ambulate further distance in hallway. All mobility performed on 2L of O2 with sats WNL. Safe technique with RW with ability to maintain upright posture. Good endurance with there-ex and pt eager to dc home this date. Spent time discussing home discharge plan including family members available for assistance. Will continue to progress as able.    Follow Up Recommendations  Home health PT;Supervision/Assistance - 24 hour     Equipment Recommendations  None recommended by PT    Recommendations for Other Services       Precautions / Restrictions Precautions Precautions: Fall Restrictions Weight Bearing Restrictions: No    Mobility  Bed Mobility Overal bed mobility: Needs Assistance Bed Mobility: Sit to Supine       Sit to supine: Supervision   General bed mobility comments: safe technique with ability to bring B LE up onto bed    Transfers Overall transfer level: Modified independent Equipment used: Rolling walker (2 wheeled) Transfers: Sit to/from Stand Sit to Stand: Modified independent (Device/Increase time)         General transfer comment: safe technique with RW. Upright posture  Ambulation/Gait Ambulation/Gait assistance: Supervision Gait Distance (Feet): 200 Feet Assistive device: Rolling walker (2 wheeled) Gait Pattern/deviations: Decreased step length - left;Decreased stance time - right;Trunk flexed;Narrow base of support     General Gait Details: ambulated around RN station with improved  balance and gait sequencing. No tremors noted. O2 remained at 90% with exertion, HR at 115bpm   Stairs             Wheelchair Mobility    Modified Rankin (Stroke Patients Only)       Balance Overall balance assessment: Needs assistance;History of Falls Sitting-balance support: Feet supported Sitting balance-Leahy Scale: Good     Standing balance support: Bilateral upper extremity supported Standing balance-Leahy Scale: Fair                              Cognition Arousal/Alertness: Awake/alert Behavior During Therapy: WFL for tasks assessed/performed Overall Cognitive Status: Within Functional Limits for tasks assessed                                        Exercises Other Exercises Other Exercises: Seated ther-ex performed x B LE including LAQ and alt. marching. 20 reps performed with supervision Other Exercises: extensive education given on home safety post discharge    General Comments        Pertinent Vitals/Pain Pain Assessment: Faces Faces Pain Scale: Hurts a little bit Pain Location: low back Pain Descriptors / Indicators: Aching;Dull Pain Intervention(s): Limited activity within patient's tolerance;Premedicated before session;Repositioned    Home Living                      Prior Function            PT Goals (current goals  can now be found in the care plan section) Acute Rehab PT Goals Patient Stated Goal: to go home PT Goal Formulation: With patient Time For Goal Achievement: 03/28/21 Potential to Achieve Goals: Good Progress towards PT goals: Progressing toward goals    Frequency    7X/week      PT Plan Current plan remains appropriate    Co-evaluation              AM-PAC PT "6 Clicks" Mobility   Outcome Measure  Help needed turning from your back to your side while in a flat bed without using bedrails?: None Help needed moving from lying on your back to sitting on the side of a flat  bed without using bedrails?: None Help needed moving to and from a bed to a chair (including a wheelchair)?: None Help needed standing up from a chair using your arms (e.g., wheelchair or bedside chair)?: None Help needed to walk in hospital room?: A Little Help needed climbing 3-5 steps with a railing? : A Little 6 Click Score: 22    End of Session Equipment Utilized During Treatment: Gait belt;Oxygen Activity Tolerance: Patient tolerated treatment well Patient left: in bed;with SCD's reapplied Nurse Communication: Mobility status PT Visit Diagnosis: Unsteadiness on feet (R26.81);Muscle weakness (generalized) (M62.81);History of falling (Z91.81);Difficulty in walking, not elsewhere classified (R26.2);Pain Pain - Right/Left:  (low back) Pain - part of body:  (back)     Time: 5859-2924 PT Time Calculation (min) (ACUTE ONLY): 26 min  Charges:  $Gait Training: 8-22 mins $Therapeutic Exercise: 8-22 mins                     Greggory Stallion, PT, DPT [phone]    Marabeth Melland 03/19/2021, 12:04 PM

## 2021-03-19 NOTE — Progress Notes (Signed)
Pt bladder scanned with PVR of 347 mls  after voiding 150 mls and no order for cathing.  On call provider, Dr.Mansy for advising.  No new orders.

## 2021-03-19 NOTE — Discharge Instructions (Signed)
Follow with Primary MD Alroy Dust, L.Marlou Sa, MD in 7 days   Get CBC, CMP,  checked  by Primary MD next visit.    Activity: As tolerated with Full fall precautions use walker/cane & assistance as needed   Disposition Home    Diet: Heart Healthy   On your next visit with your primary care physician please Get Medicines reviewed and adjusted.   Please request your Prim.MD to go over all Hospital Tests and Procedure/Radiological results at the follow up, please get all Hospital records sent to your Prim MD by signing hospital release before you go home.   If you experience worsening of your admission symptoms, develop shortness of breath, life threatening emergency, suicidal or homicidal thoughts you must seek medical attention immediately by calling 911 or calling your MD immediately  if symptoms less severe.  You Must read complete instructions/literature along with all the possible adverse reactions/side effects for all the Medicines you take and that have been prescribed to you. Take any new Medicines after you have completely understood and accpet all the possible adverse reactions/side effects.   Do not drive when taking Pain medications.    Do not take more than prescribed Pain, Sleep and Anxiety Medications  Special Instructions: If you have smoked or chewed Tobacco  in the last 2 yrs please stop smoking, stop any regular Alcohol  and or any Recreational drug use.  Wear Seat belts while driving.   Please note  You were cared for by a hospitalist during your hospital stay. If you have any questions about your discharge medications or the care you received while you were in the hospital after you are discharged, you can call the unit and asked to speak with the hospitalist on call if the hospitalist that took care of you is not available. Once you are discharged, your primary care physician will handle any further medical issues. Please note that NO REFILLS for any discharge  medications will be authorized once you are discharged, as it is imperative that you return to your primary care physician (or establish a relationship with a primary care physician if you do not have one) for your aftercare needs so that they can reassess your need for medications and monitor your lab values.

## 2021-03-19 NOTE — Progress Notes (Signed)
Blood pressure (!) 119/96, pulse (!) 101, temperature 98 F (36.7 C), temperature source Oral, resp. rate 18, height 5\' 11"  (1.803 m), weight 77.1 kg, SpO2 96 %. IV cath. Removed site was clean, dry and intact. Pt transported via W/C with all personal belongings and portable 02 for home use down to private car. Discussed D/C packet with pt sister where she verbalized understanding.

## 2021-03-20 DIAGNOSIS — E8770 Fluid overload, unspecified: Secondary | ICD-10-CM | POA: Diagnosis not present

## 2021-03-20 DIAGNOSIS — R0689 Other abnormalities of breathing: Secondary | ICD-10-CM | POA: Diagnosis not present

## 2021-03-20 DIAGNOSIS — E876 Hypokalemia: Secondary | ICD-10-CM | POA: Diagnosis not present

## 2021-03-20 DIAGNOSIS — I10 Essential (primary) hypertension: Secondary | ICD-10-CM | POA: Diagnosis not present

## 2021-03-20 DIAGNOSIS — E7220 Disorder of urea cycle metabolism, unspecified: Secondary | ICD-10-CM | POA: Diagnosis not present

## 2021-03-20 DIAGNOSIS — I499 Cardiac arrhythmia, unspecified: Secondary | ICD-10-CM | POA: Diagnosis not present

## 2021-03-20 DIAGNOSIS — R188 Other ascites: Secondary | ICD-10-CM | POA: Diagnosis not present

## 2021-03-20 DIAGNOSIS — I959 Hypotension, unspecified: Secondary | ICD-10-CM | POA: Diagnosis not present

## 2021-03-20 DIAGNOSIS — G210 Malignant neuroleptic syndrome: Secondary | ICD-10-CM | POA: Diagnosis not present

## 2021-03-20 DIAGNOSIS — K5730 Diverticulosis of large intestine without perforation or abscess without bleeding: Secondary | ICD-10-CM | POA: Diagnosis not present

## 2021-03-20 DIAGNOSIS — J984 Other disorders of lung: Secondary | ICD-10-CM | POA: Diagnosis not present

## 2021-03-20 DIAGNOSIS — I824Z1 Acute embolism and thrombosis of unspecified deep veins of right distal lower extremity: Secondary | ICD-10-CM | POA: Diagnosis not present

## 2021-03-20 DIAGNOSIS — I824Z2 Acute embolism and thrombosis of unspecified deep veins of left distal lower extremity: Secondary | ICD-10-CM | POA: Diagnosis not present

## 2021-03-20 DIAGNOSIS — R0902 Hypoxemia: Secondary | ICD-10-CM | POA: Diagnosis not present

## 2021-03-20 DIAGNOSIS — I2510 Atherosclerotic heart disease of native coronary artery without angina pectoris: Secondary | ICD-10-CM | POA: Diagnosis not present

## 2021-03-20 DIAGNOSIS — R600 Localized edema: Secondary | ICD-10-CM | POA: Diagnosis not present

## 2021-03-20 DIAGNOSIS — R451 Restlessness and agitation: Secondary | ICD-10-CM | POA: Diagnosis not present

## 2021-03-20 DIAGNOSIS — Z20822 Contact with and (suspected) exposure to covid-19: Secondary | ICD-10-CM | POA: Diagnosis not present

## 2021-03-20 DIAGNOSIS — B3741 Candidal cystitis and urethritis: Secondary | ICD-10-CM | POA: Diagnosis not present

## 2021-03-20 DIAGNOSIS — K9423 Gastrostomy malfunction: Secondary | ICD-10-CM | POA: Diagnosis not present

## 2021-03-20 DIAGNOSIS — A4150 Gram-negative sepsis, unspecified: Secondary | ICD-10-CM | POA: Diagnosis not present

## 2021-03-20 DIAGNOSIS — E785 Hyperlipidemia, unspecified: Secondary | ICD-10-CM | POA: Diagnosis not present

## 2021-03-20 DIAGNOSIS — K611 Rectal abscess: Secondary | ICD-10-CM | POA: Diagnosis not present

## 2021-03-20 DIAGNOSIS — R140 Abdominal distension (gaseous): Secondary | ICD-10-CM | POA: Diagnosis not present

## 2021-03-20 DIAGNOSIS — J342 Deviated nasal septum: Secondary | ICD-10-CM | POA: Diagnosis not present

## 2021-03-20 DIAGNOSIS — G9389 Other specified disorders of brain: Secondary | ICD-10-CM | POA: Diagnosis not present

## 2021-03-20 DIAGNOSIS — E46 Unspecified protein-calorie malnutrition: Secondary | ICD-10-CM | POA: Diagnosis not present

## 2021-03-20 DIAGNOSIS — D62 Acute posthemorrhagic anemia: Secondary | ICD-10-CM | POA: Diagnosis not present

## 2021-03-20 DIAGNOSIS — K631 Perforation of intestine (nontraumatic): Secondary | ICD-10-CM | POA: Diagnosis not present

## 2021-03-20 DIAGNOSIS — Z452 Encounter for adjustment and management of vascular access device: Secondary | ICD-10-CM | POA: Diagnosis not present

## 2021-03-20 DIAGNOSIS — J9690 Respiratory failure, unspecified, unspecified whether with hypoxia or hypercapnia: Secondary | ICD-10-CM | POA: Diagnosis not present

## 2021-03-20 DIAGNOSIS — B9689 Other specified bacterial agents as the cause of diseases classified elsewhere: Secondary | ICD-10-CM | POA: Diagnosis not present

## 2021-03-20 DIAGNOSIS — D539 Nutritional anemia, unspecified: Secondary | ICD-10-CM | POA: Diagnosis not present

## 2021-03-20 DIAGNOSIS — R6521 Severe sepsis with septic shock: Secondary | ICD-10-CM | POA: Diagnosis not present

## 2021-03-20 DIAGNOSIS — J9600 Acute respiratory failure, unspecified whether with hypoxia or hypercapnia: Secondary | ICD-10-CM | POA: Diagnosis not present

## 2021-03-20 DIAGNOSIS — Z79899 Other long term (current) drug therapy: Secondary | ICD-10-CM | POA: Diagnosis not present

## 2021-03-20 DIAGNOSIS — R410 Disorientation, unspecified: Secondary | ICD-10-CM | POA: Diagnosis not present

## 2021-03-20 DIAGNOSIS — R6889 Other general symptoms and signs: Secondary | ICD-10-CM | POA: Diagnosis not present

## 2021-03-20 DIAGNOSIS — M6281 Muscle weakness (generalized): Secondary | ICD-10-CM | POA: Diagnosis not present

## 2021-03-20 DIAGNOSIS — E878 Other disorders of electrolyte and fluid balance, not elsewhere classified: Secondary | ICD-10-CM | POA: Diagnosis not present

## 2021-03-20 DIAGNOSIS — L89153 Pressure ulcer of sacral region, stage 3: Secondary | ICD-10-CM | POA: Diagnosis not present

## 2021-03-20 DIAGNOSIS — J811 Chronic pulmonary edema: Secondary | ICD-10-CM | POA: Diagnosis not present

## 2021-03-20 DIAGNOSIS — I871 Compression of vein: Secondary | ICD-10-CM | POA: Diagnosis not present

## 2021-03-20 DIAGNOSIS — R262 Difficulty in walking, not elsewhere classified: Secondary | ICD-10-CM | POA: Diagnosis not present

## 2021-03-20 DIAGNOSIS — I82409 Acute embolism and thrombosis of unspecified deep veins of unspecified lower extremity: Secondary | ICD-10-CM | POA: Diagnosis not present

## 2021-03-20 DIAGNOSIS — G9340 Encephalopathy, unspecified: Secondary | ICD-10-CM | POA: Diagnosis not present

## 2021-03-20 DIAGNOSIS — R933 Abnormal findings on diagnostic imaging of other parts of digestive tract: Secondary | ICD-10-CM | POA: Diagnosis not present

## 2021-03-20 DIAGNOSIS — N390 Urinary tract infection, site not specified: Secondary | ICD-10-CM | POA: Diagnosis not present

## 2021-03-20 DIAGNOSIS — Z4682 Encounter for fitting and adjustment of non-vascular catheter: Secondary | ICD-10-CM | POA: Diagnosis not present

## 2021-03-20 DIAGNOSIS — J432 Centrilobular emphysema: Secondary | ICD-10-CM | POA: Diagnosis not present

## 2021-03-20 DIAGNOSIS — Z930 Tracheostomy status: Secondary | ICD-10-CM | POA: Diagnosis not present

## 2021-03-20 DIAGNOSIS — K8689 Other specified diseases of pancreas: Secondary | ICD-10-CM | POA: Diagnosis not present

## 2021-03-20 DIAGNOSIS — S40021A Contusion of right upper arm, initial encounter: Secondary | ICD-10-CM | POA: Diagnosis not present

## 2021-03-20 DIAGNOSIS — Z792 Long term (current) use of antibiotics: Secondary | ICD-10-CM | POA: Diagnosis not present

## 2021-03-20 DIAGNOSIS — J9811 Atelectasis: Secondary | ICD-10-CM | POA: Diagnosis not present

## 2021-03-20 DIAGNOSIS — E43 Unspecified severe protein-calorie malnutrition: Secondary | ICD-10-CM | POA: Diagnosis not present

## 2021-03-20 DIAGNOSIS — J9612 Chronic respiratory failure with hypercapnia: Secondary | ICD-10-CM | POA: Diagnosis not present

## 2021-03-20 DIAGNOSIS — Z9049 Acquired absence of other specified parts of digestive tract: Secondary | ICD-10-CM | POA: Diagnosis not present

## 2021-03-20 DIAGNOSIS — R918 Other nonspecific abnormal finding of lung field: Secondary | ICD-10-CM | POA: Diagnosis not present

## 2021-03-20 DIAGNOSIS — I6523 Occlusion and stenosis of bilateral carotid arteries: Secondary | ICD-10-CM | POA: Diagnosis not present

## 2021-03-20 DIAGNOSIS — G928 Other toxic encephalopathy: Secondary | ICD-10-CM | POA: Diagnosis not present

## 2021-03-20 DIAGNOSIS — R579 Shock, unspecified: Secondary | ICD-10-CM | POA: Diagnosis not present

## 2021-03-20 DIAGNOSIS — E860 Dehydration: Secondary | ICD-10-CM | POA: Diagnosis not present

## 2021-03-20 DIAGNOSIS — D649 Anemia, unspecified: Secondary | ICD-10-CM | POA: Diagnosis not present

## 2021-03-20 DIAGNOSIS — Z743 Need for continuous supervision: Secondary | ICD-10-CM | POA: Diagnosis not present

## 2021-03-20 DIAGNOSIS — R0603 Acute respiratory distress: Secondary | ICD-10-CM | POA: Diagnosis not present

## 2021-03-20 DIAGNOSIS — Z931 Gastrostomy status: Secondary | ICD-10-CM | POA: Diagnosis not present

## 2021-03-20 DIAGNOSIS — R0989 Other specified symptoms and signs involving the circulatory and respiratory systems: Secondary | ICD-10-CM | POA: Diagnosis not present

## 2021-03-20 DIAGNOSIS — R1084 Generalized abdominal pain: Secondary | ICD-10-CM | POA: Diagnosis not present

## 2021-03-20 DIAGNOSIS — R5381 Other malaise: Secondary | ICD-10-CM | POA: Diagnosis not present

## 2021-03-20 DIAGNOSIS — R0600 Dyspnea, unspecified: Secondary | ICD-10-CM | POA: Diagnosis not present

## 2021-03-20 DIAGNOSIS — Z9911 Dependence on respirator [ventilator] status: Secondary | ICD-10-CM | POA: Diagnosis not present

## 2021-03-20 DIAGNOSIS — R5082 Postprocedural fever: Secondary | ICD-10-CM | POA: Diagnosis not present

## 2021-03-20 DIAGNOSIS — J449 Chronic obstructive pulmonary disease, unspecified: Secondary | ICD-10-CM | POA: Diagnosis not present

## 2021-03-20 DIAGNOSIS — A419 Sepsis, unspecified organism: Secondary | ICD-10-CM | POA: Diagnosis not present

## 2021-03-20 DIAGNOSIS — E278 Other specified disorders of adrenal gland: Secondary | ICD-10-CM | POA: Diagnosis not present

## 2021-03-20 DIAGNOSIS — B3749 Other urogenital candidiasis: Secondary | ICD-10-CM | POA: Diagnosis not present

## 2021-03-20 DIAGNOSIS — R279 Unspecified lack of coordination: Secondary | ICD-10-CM | POA: Diagnosis not present

## 2021-03-20 DIAGNOSIS — R000 Tachycardia, unspecified: Secondary | ICD-10-CM | POA: Diagnosis not present

## 2021-03-20 DIAGNOSIS — R509 Fever, unspecified: Secondary | ICD-10-CM | POA: Diagnosis not present

## 2021-03-20 DIAGNOSIS — K651 Peritoneal abscess: Secondary | ICD-10-CM | POA: Diagnosis not present

## 2021-03-20 DIAGNOSIS — R0602 Shortness of breath: Secondary | ICD-10-CM | POA: Diagnosis not present

## 2021-03-20 DIAGNOSIS — E872 Acidosis: Secondary | ICD-10-CM | POA: Diagnosis not present

## 2021-03-20 DIAGNOSIS — G9341 Metabolic encephalopathy: Secondary | ICD-10-CM | POA: Diagnosis not present

## 2021-03-20 DIAGNOSIS — K9429 Other complications of gastrostomy: Secondary | ICD-10-CM | POA: Diagnosis not present

## 2021-03-20 DIAGNOSIS — I82512 Chronic embolism and thrombosis of left femoral vein: Secondary | ICD-10-CM | POA: Diagnosis not present

## 2021-03-20 DIAGNOSIS — R197 Diarrhea, unspecified: Secondary | ICD-10-CM | POA: Diagnosis not present

## 2021-03-20 DIAGNOSIS — Z96642 Presence of left artificial hip joint: Secondary | ICD-10-CM | POA: Diagnosis not present

## 2021-03-20 DIAGNOSIS — B379 Candidiasis, unspecified: Secondary | ICD-10-CM | POA: Diagnosis not present

## 2021-03-20 DIAGNOSIS — G479 Sleep disorder, unspecified: Secondary | ICD-10-CM | POA: Diagnosis not present

## 2021-03-20 DIAGNOSIS — J708 Respiratory conditions due to other specified external agents: Secondary | ICD-10-CM | POA: Diagnosis not present

## 2021-03-20 DIAGNOSIS — L02211 Cutaneous abscess of abdominal wall: Secondary | ICD-10-CM | POA: Diagnosis not present

## 2021-03-20 DIAGNOSIS — I6782 Cerebral ischemia: Secondary | ICD-10-CM | POA: Diagnosis not present

## 2021-03-20 DIAGNOSIS — Z7289 Other problems related to lifestyle: Secondary | ICD-10-CM | POA: Diagnosis not present

## 2021-03-20 DIAGNOSIS — R069 Unspecified abnormalities of breathing: Secondary | ICD-10-CM | POA: Diagnosis not present

## 2021-03-20 DIAGNOSIS — R339 Retention of urine, unspecified: Secondary | ICD-10-CM | POA: Diagnosis not present

## 2021-03-20 DIAGNOSIS — R34 Anuria and oliguria: Secondary | ICD-10-CM | POA: Diagnosis not present

## 2021-03-20 DIAGNOSIS — T8143XA Infection following a procedure, organ and space surgical site, initial encounter: Secondary | ICD-10-CM | POA: Diagnosis not present

## 2021-03-20 DIAGNOSIS — E870 Hyperosmolality and hypernatremia: Secondary | ICD-10-CM | POA: Diagnosis not present

## 2021-03-20 DIAGNOSIS — K922 Gastrointestinal hemorrhage, unspecified: Secondary | ICD-10-CM | POA: Diagnosis not present

## 2021-03-20 DIAGNOSIS — R6339 Other feeding difficulties: Secondary | ICD-10-CM | POA: Diagnosis not present

## 2021-03-20 DIAGNOSIS — K560 Paralytic ileus: Secondary | ICD-10-CM | POA: Diagnosis not present

## 2021-03-20 DIAGNOSIS — A499 Bacterial infection, unspecified: Secondary | ICD-10-CM | POA: Diagnosis not present

## 2021-03-20 DIAGNOSIS — N179 Acute kidney failure, unspecified: Secondary | ICD-10-CM | POA: Diagnosis not present

## 2021-03-20 DIAGNOSIS — R2231 Localized swelling, mass and lump, right upper limb: Secondary | ICD-10-CM | POA: Diagnosis not present

## 2021-03-20 DIAGNOSIS — Z431 Encounter for attention to gastrostomy: Secondary | ICD-10-CM | POA: Diagnosis not present

## 2021-03-20 DIAGNOSIS — J90 Pleural effusion, not elsewhere classified: Secondary | ICD-10-CM | POA: Diagnosis not present

## 2021-03-20 DIAGNOSIS — Z720 Tobacco use: Secondary | ICD-10-CM | POA: Diagnosis not present

## 2021-03-20 DIAGNOSIS — R9389 Abnormal findings on diagnostic imaging of other specified body structures: Secondary | ICD-10-CM | POA: Diagnosis not present

## 2021-03-20 DIAGNOSIS — I82492 Acute embolism and thrombosis of other specified deep vein of left lower extremity: Secondary | ICD-10-CM | POA: Diagnosis not present

## 2021-03-20 DIAGNOSIS — G319 Degenerative disease of nervous system, unspecified: Secondary | ICD-10-CM | POA: Diagnosis not present

## 2021-03-20 DIAGNOSIS — E871 Hypo-osmolality and hyponatremia: Secondary | ICD-10-CM | POA: Diagnosis not present

## 2021-03-20 DIAGNOSIS — K668 Other specified disorders of peritoneum: Secondary | ICD-10-CM | POA: Diagnosis not present

## 2021-03-20 DIAGNOSIS — J9601 Acute respiratory failure with hypoxia: Secondary | ICD-10-CM | POA: Diagnosis not present

## 2021-03-20 DIAGNOSIS — J9602 Acute respiratory failure with hypercapnia: Secondary | ICD-10-CM | POA: Diagnosis not present

## 2021-03-20 NOTE — Progress Notes (Signed)
   03/18/21 0839  Assess: MEWS Score  Temp 98.1 F (36.7 C)  BP 116/86  Pulse Rate (!) 114  Resp 15  SpO2 91 %  O2 Device Room Air  Assess: MEWS Score  MEWS Temp 0  MEWS Systolic 0  MEWS Pulse 2  MEWS RR 0  MEWS LOC 0  MEWS Score 2  MEWS Score Color Yellow  Assess: if the MEWS score is Yellow or Red  Were vital signs taken at a resting state? Yes  Focused Assessment No change from prior assessment  Early Detection of Sepsis Score *See Row Information* Low  MEWS guidelines implemented *See Row Information* Yes  Treat  MEWS Interventions Escalated (See documentation below)  Take Vital Signs  Increase Vital Sign Frequency  Yellow: Q 2hr X 2 then Q 4hr X 2, if remains yellow, continue Q 4hrs  Escalate  MEWS: Escalate Yellow: discuss with charge nurse/RN and consider discussing with provider and RRT  Notify: Charge Nurse/RN  Name of Charge Nurse/RN Notified Gerald Stabs RN  Date Charge Nurse/RN Notified 03/18/21  Time Charge Nurse/RN Notified 0912  Notify: Provider  Provider Name/Title Dr. [facility]  Date Provider Notified 03/18/21  Time Provider Notified [phone]  Notification Type Face-to-face  Provider response See new orders  Inserted for Maggie Schwalbe LPN

## 2021-03-21 DIAGNOSIS — J9811 Atelectasis: Secondary | ICD-10-CM | POA: Diagnosis not present

## 2021-03-21 DIAGNOSIS — Z4682 Encounter for fitting and adjustment of non-vascular catheter: Secondary | ICD-10-CM | POA: Diagnosis not present

## 2021-03-21 DIAGNOSIS — R0902 Hypoxemia: Secondary | ICD-10-CM | POA: Diagnosis not present

## 2021-03-21 DIAGNOSIS — R6521 Severe sepsis with septic shock: Secondary | ICD-10-CM | POA: Diagnosis not present

## 2021-03-21 DIAGNOSIS — R9389 Abnormal findings on diagnostic imaging of other specified body structures: Secondary | ICD-10-CM | POA: Diagnosis not present

## 2021-03-21 DIAGNOSIS — A419 Sepsis, unspecified organism: Secondary | ICD-10-CM | POA: Diagnosis not present

## 2021-03-21 DIAGNOSIS — E872 Acidosis: Secondary | ICD-10-CM | POA: Diagnosis not present

## 2021-03-21 DIAGNOSIS — Z96642 Presence of left artificial hip joint: Secondary | ICD-10-CM | POA: Diagnosis not present

## 2021-03-21 DIAGNOSIS — K631 Perforation of intestine (nontraumatic): Secondary | ICD-10-CM | POA: Diagnosis not present

## 2021-03-22 DIAGNOSIS — A419 Sepsis, unspecified organism: Secondary | ICD-10-CM | POA: Diagnosis not present

## 2021-03-22 DIAGNOSIS — R0989 Other specified symptoms and signs involving the circulatory and respiratory systems: Secondary | ICD-10-CM | POA: Diagnosis not present

## 2021-03-22 DIAGNOSIS — E872 Acidosis: Secondary | ICD-10-CM | POA: Diagnosis not present

## 2021-03-22 DIAGNOSIS — J9811 Atelectasis: Secondary | ICD-10-CM | POA: Diagnosis not present

## 2021-03-22 DIAGNOSIS — R9389 Abnormal findings on diagnostic imaging of other specified body structures: Secondary | ICD-10-CM | POA: Diagnosis not present

## 2021-03-22 DIAGNOSIS — K631 Perforation of intestine (nontraumatic): Secondary | ICD-10-CM | POA: Diagnosis not present

## 2021-03-22 DIAGNOSIS — R6521 Severe sepsis with septic shock: Secondary | ICD-10-CM | POA: Diagnosis not present

## 2021-03-22 DIAGNOSIS — R0902 Hypoxemia: Secondary | ICD-10-CM | POA: Diagnosis not present

## 2021-03-23 DIAGNOSIS — A419 Sepsis, unspecified organism: Secondary | ICD-10-CM | POA: Diagnosis not present

## 2021-03-23 DIAGNOSIS — R918 Other nonspecific abnormal finding of lung field: Secondary | ICD-10-CM | POA: Diagnosis not present

## 2021-03-23 DIAGNOSIS — R6521 Severe sepsis with septic shock: Secondary | ICD-10-CM | POA: Diagnosis not present

## 2021-03-23 DIAGNOSIS — R0902 Hypoxemia: Secondary | ICD-10-CM | POA: Diagnosis not present

## 2021-03-24 DIAGNOSIS — A419 Sepsis, unspecified organism: Secondary | ICD-10-CM | POA: Diagnosis not present

## 2021-03-24 DIAGNOSIS — J90 Pleural effusion, not elsewhere classified: Secondary | ICD-10-CM | POA: Diagnosis not present

## 2021-03-24 DIAGNOSIS — R918 Other nonspecific abnormal finding of lung field: Secondary | ICD-10-CM | POA: Diagnosis not present

## 2021-03-24 DIAGNOSIS — R6521 Severe sepsis with septic shock: Secondary | ICD-10-CM | POA: Diagnosis not present

## 2021-03-24 DIAGNOSIS — Z4682 Encounter for fitting and adjustment of non-vascular catheter: Secondary | ICD-10-CM | POA: Diagnosis not present

## 2021-03-24 DIAGNOSIS — Z9911 Dependence on respirator [ventilator] status: Secondary | ICD-10-CM | POA: Diagnosis not present

## 2021-03-24 DIAGNOSIS — R0902 Hypoxemia: Secondary | ICD-10-CM | POA: Diagnosis not present

## 2021-03-25 DIAGNOSIS — R6521 Severe sepsis with septic shock: Secondary | ICD-10-CM | POA: Diagnosis not present

## 2021-03-25 DIAGNOSIS — Z4682 Encounter for fitting and adjustment of non-vascular catheter: Secondary | ICD-10-CM | POA: Diagnosis not present

## 2021-03-25 DIAGNOSIS — R0689 Other abnormalities of breathing: Secondary | ICD-10-CM | POA: Diagnosis not present

## 2021-03-25 DIAGNOSIS — I959 Hypotension, unspecified: Secondary | ICD-10-CM | POA: Diagnosis not present

## 2021-03-25 DIAGNOSIS — A4150 Gram-negative sepsis, unspecified: Secondary | ICD-10-CM | POA: Diagnosis not present

## 2021-03-25 DIAGNOSIS — K631 Perforation of intestine (nontraumatic): Secondary | ICD-10-CM | POA: Diagnosis not present

## 2021-03-25 DIAGNOSIS — Z9911 Dependence on respirator [ventilator] status: Secondary | ICD-10-CM | POA: Diagnosis not present

## 2021-03-25 DIAGNOSIS — N179 Acute kidney failure, unspecified: Secondary | ICD-10-CM | POA: Diagnosis not present

## 2021-03-26 DIAGNOSIS — R0689 Other abnormalities of breathing: Secondary | ICD-10-CM | POA: Diagnosis not present

## 2021-03-26 DIAGNOSIS — A4150 Gram-negative sepsis, unspecified: Secondary | ICD-10-CM | POA: Diagnosis not present

## 2021-03-26 DIAGNOSIS — Z4682 Encounter for fitting and adjustment of non-vascular catheter: Secondary | ICD-10-CM | POA: Diagnosis not present

## 2021-03-26 DIAGNOSIS — N179 Acute kidney failure, unspecified: Secondary | ICD-10-CM | POA: Diagnosis not present

## 2021-03-26 DIAGNOSIS — Z9911 Dependence on respirator [ventilator] status: Secondary | ICD-10-CM | POA: Diagnosis not present

## 2021-03-26 DIAGNOSIS — R6521 Severe sepsis with septic shock: Secondary | ICD-10-CM | POA: Diagnosis not present

## 2021-03-26 DIAGNOSIS — Z452 Encounter for adjustment and management of vascular access device: Secondary | ICD-10-CM | POA: Diagnosis not present

## 2021-03-27 DIAGNOSIS — N179 Acute kidney failure, unspecified: Secondary | ICD-10-CM | POA: Diagnosis not present

## 2021-03-27 DIAGNOSIS — R0689 Other abnormalities of breathing: Secondary | ICD-10-CM | POA: Diagnosis not present

## 2021-03-27 DIAGNOSIS — J811 Chronic pulmonary edema: Secondary | ICD-10-CM | POA: Diagnosis not present

## 2021-03-27 DIAGNOSIS — A4150 Gram-negative sepsis, unspecified: Secondary | ICD-10-CM | POA: Diagnosis not present

## 2021-03-27 DIAGNOSIS — R579 Shock, unspecified: Secondary | ICD-10-CM | POA: Diagnosis not present

## 2021-03-27 DIAGNOSIS — R188 Other ascites: Secondary | ICD-10-CM | POA: Diagnosis not present

## 2021-03-27 DIAGNOSIS — R6521 Severe sepsis with septic shock: Secondary | ICD-10-CM | POA: Diagnosis not present

## 2021-03-27 DIAGNOSIS — Z9911 Dependence on respirator [ventilator] status: Secondary | ICD-10-CM | POA: Diagnosis not present

## 2021-03-28 DIAGNOSIS — R6521 Severe sepsis with septic shock: Secondary | ICD-10-CM | POA: Diagnosis not present

## 2021-03-28 DIAGNOSIS — K631 Perforation of intestine (nontraumatic): Secondary | ICD-10-CM | POA: Diagnosis not present

## 2021-03-28 DIAGNOSIS — Z9911 Dependence on respirator [ventilator] status: Secondary | ICD-10-CM | POA: Diagnosis not present

## 2021-03-28 DIAGNOSIS — A4150 Gram-negative sepsis, unspecified: Secondary | ICD-10-CM | POA: Diagnosis not present

## 2021-03-28 DIAGNOSIS — R0689 Other abnormalities of breathing: Secondary | ICD-10-CM | POA: Diagnosis not present

## 2021-03-28 DIAGNOSIS — R0603 Acute respiratory distress: Secondary | ICD-10-CM | POA: Diagnosis not present

## 2021-03-28 DIAGNOSIS — Z4682 Encounter for fitting and adjustment of non-vascular catheter: Secondary | ICD-10-CM | POA: Diagnosis not present

## 2021-03-28 DIAGNOSIS — R579 Shock, unspecified: Secondary | ICD-10-CM | POA: Diagnosis not present

## 2021-03-28 DIAGNOSIS — N179 Acute kidney failure, unspecified: Secondary | ICD-10-CM | POA: Diagnosis not present

## 2021-03-28 DIAGNOSIS — R918 Other nonspecific abnormal finding of lung field: Secondary | ICD-10-CM | POA: Diagnosis not present

## 2021-03-29 DIAGNOSIS — A4150 Gram-negative sepsis, unspecified: Secondary | ICD-10-CM | POA: Diagnosis not present

## 2021-03-29 DIAGNOSIS — R6521 Severe sepsis with septic shock: Secondary | ICD-10-CM | POA: Diagnosis not present

## 2021-03-29 DIAGNOSIS — R0689 Other abnormalities of breathing: Secondary | ICD-10-CM | POA: Diagnosis not present

## 2021-03-29 DIAGNOSIS — R918 Other nonspecific abnormal finding of lung field: Secondary | ICD-10-CM | POA: Diagnosis not present

## 2021-03-29 DIAGNOSIS — K631 Perforation of intestine (nontraumatic): Secondary | ICD-10-CM | POA: Diagnosis not present

## 2021-03-29 DIAGNOSIS — Z9911 Dependence on respirator [ventilator] status: Secondary | ICD-10-CM | POA: Diagnosis not present

## 2021-03-29 DIAGNOSIS — K651 Peritoneal abscess: Secondary | ICD-10-CM | POA: Diagnosis not present

## 2021-03-29 DIAGNOSIS — J90 Pleural effusion, not elsewhere classified: Secondary | ICD-10-CM | POA: Diagnosis not present

## 2021-03-29 DIAGNOSIS — R188 Other ascites: Secondary | ICD-10-CM | POA: Diagnosis not present

## 2021-03-29 DIAGNOSIS — N179 Acute kidney failure, unspecified: Secondary | ICD-10-CM | POA: Diagnosis not present

## 2021-03-30 DIAGNOSIS — A4150 Gram-negative sepsis, unspecified: Secondary | ICD-10-CM | POA: Diagnosis not present

## 2021-03-30 DIAGNOSIS — Z9911 Dependence on respirator [ventilator] status: Secondary | ICD-10-CM | POA: Diagnosis not present

## 2021-03-30 DIAGNOSIS — R0689 Other abnormalities of breathing: Secondary | ICD-10-CM | POA: Diagnosis not present

## 2021-03-30 DIAGNOSIS — Z4682 Encounter for fitting and adjustment of non-vascular catheter: Secondary | ICD-10-CM | POA: Diagnosis not present

## 2021-03-30 DIAGNOSIS — K631 Perforation of intestine (nontraumatic): Secondary | ICD-10-CM | POA: Diagnosis not present

## 2021-03-30 DIAGNOSIS — N179 Acute kidney failure, unspecified: Secondary | ICD-10-CM | POA: Diagnosis not present

## 2021-03-30 DIAGNOSIS — R6521 Severe sepsis with septic shock: Secondary | ICD-10-CM | POA: Diagnosis not present

## 2021-03-30 DIAGNOSIS — R918 Other nonspecific abnormal finding of lung field: Secondary | ICD-10-CM | POA: Diagnosis not present

## 2021-03-31 DIAGNOSIS — K631 Perforation of intestine (nontraumatic): Secondary | ICD-10-CM | POA: Diagnosis not present

## 2021-03-31 DIAGNOSIS — I959 Hypotension, unspecified: Secondary | ICD-10-CM | POA: Diagnosis not present

## 2021-03-31 DIAGNOSIS — J811 Chronic pulmonary edema: Secondary | ICD-10-CM | POA: Diagnosis not present

## 2021-03-31 DIAGNOSIS — Z9911 Dependence on respirator [ventilator] status: Secondary | ICD-10-CM | POA: Diagnosis not present

## 2021-03-31 DIAGNOSIS — R34 Anuria and oliguria: Secondary | ICD-10-CM | POA: Diagnosis not present

## 2021-03-31 DIAGNOSIS — J9600 Acute respiratory failure, unspecified whether with hypoxia or hypercapnia: Secondary | ICD-10-CM | POA: Diagnosis not present

## 2021-03-31 DIAGNOSIS — R451 Restlessness and agitation: Secondary | ICD-10-CM | POA: Diagnosis not present

## 2021-03-31 DIAGNOSIS — R918 Other nonspecific abnormal finding of lung field: Secondary | ICD-10-CM | POA: Diagnosis not present

## 2021-04-01 DIAGNOSIS — R410 Disorientation, unspecified: Secondary | ICD-10-CM | POA: Diagnosis not present

## 2021-04-01 DIAGNOSIS — K631 Perforation of intestine (nontraumatic): Secondary | ICD-10-CM | POA: Diagnosis not present

## 2021-04-01 DIAGNOSIS — G928 Other toxic encephalopathy: Secondary | ICD-10-CM | POA: Diagnosis not present

## 2021-04-01 DIAGNOSIS — J9600 Acute respiratory failure, unspecified whether with hypoxia or hypercapnia: Secondary | ICD-10-CM | POA: Diagnosis not present

## 2021-04-01 DIAGNOSIS — Z4682 Encounter for fitting and adjustment of non-vascular catheter: Secondary | ICD-10-CM | POA: Diagnosis not present

## 2021-04-01 DIAGNOSIS — R188 Other ascites: Secondary | ICD-10-CM | POA: Diagnosis not present

## 2021-04-01 DIAGNOSIS — J432 Centrilobular emphysema: Secondary | ICD-10-CM | POA: Diagnosis not present

## 2021-04-01 DIAGNOSIS — R000 Tachycardia, unspecified: Secondary | ICD-10-CM | POA: Diagnosis not present

## 2021-04-01 DIAGNOSIS — R933 Abnormal findings on diagnostic imaging of other parts of digestive tract: Secondary | ICD-10-CM | POA: Diagnosis not present

## 2021-04-01 DIAGNOSIS — R451 Restlessness and agitation: Secondary | ICD-10-CM | POA: Diagnosis not present

## 2021-04-01 DIAGNOSIS — R918 Other nonspecific abnormal finding of lung field: Secondary | ICD-10-CM | POA: Diagnosis not present

## 2021-04-01 DIAGNOSIS — R509 Fever, unspecified: Secondary | ICD-10-CM | POA: Diagnosis not present

## 2021-04-01 DIAGNOSIS — D62 Acute posthemorrhagic anemia: Secondary | ICD-10-CM | POA: Diagnosis not present

## 2021-04-01 DIAGNOSIS — J90 Pleural effusion, not elsewhere classified: Secondary | ICD-10-CM | POA: Diagnosis not present

## 2021-04-01 DIAGNOSIS — Z9911 Dependence on respirator [ventilator] status: Secondary | ICD-10-CM | POA: Diagnosis not present

## 2021-04-01 DIAGNOSIS — E878 Other disorders of electrolyte and fluid balance, not elsewhere classified: Secondary | ICD-10-CM | POA: Diagnosis not present

## 2021-04-01 DIAGNOSIS — J9811 Atelectasis: Secondary | ICD-10-CM | POA: Diagnosis not present

## 2021-04-02 DIAGNOSIS — Z9911 Dependence on respirator [ventilator] status: Secondary | ICD-10-CM | POA: Diagnosis not present

## 2021-04-02 DIAGNOSIS — R509 Fever, unspecified: Secondary | ICD-10-CM | POA: Diagnosis not present

## 2021-04-02 DIAGNOSIS — G928 Other toxic encephalopathy: Secondary | ICD-10-CM | POA: Diagnosis not present

## 2021-04-02 DIAGNOSIS — J9600 Acute respiratory failure, unspecified whether with hypoxia or hypercapnia: Secondary | ICD-10-CM | POA: Diagnosis not present

## 2021-04-02 DIAGNOSIS — R410 Disorientation, unspecified: Secondary | ICD-10-CM | POA: Diagnosis not present

## 2021-04-02 DIAGNOSIS — K631 Perforation of intestine (nontraumatic): Secondary | ICD-10-CM | POA: Diagnosis not present

## 2021-04-02 DIAGNOSIS — R451 Restlessness and agitation: Secondary | ICD-10-CM | POA: Diagnosis not present

## 2021-04-02 DIAGNOSIS — D62 Acute posthemorrhagic anemia: Secondary | ICD-10-CM | POA: Diagnosis not present

## 2021-04-02 DIAGNOSIS — E878 Other disorders of electrolyte and fluid balance, not elsewhere classified: Secondary | ICD-10-CM | POA: Diagnosis not present

## 2021-04-03 DIAGNOSIS — R451 Restlessness and agitation: Secondary | ICD-10-CM | POA: Diagnosis not present

## 2021-04-03 DIAGNOSIS — E878 Other disorders of electrolyte and fluid balance, not elsewhere classified: Secondary | ICD-10-CM | POA: Diagnosis not present

## 2021-04-03 DIAGNOSIS — R410 Disorientation, unspecified: Secondary | ICD-10-CM | POA: Diagnosis not present

## 2021-04-03 DIAGNOSIS — J811 Chronic pulmonary edema: Secondary | ICD-10-CM | POA: Diagnosis not present

## 2021-04-03 DIAGNOSIS — G479 Sleep disorder, unspecified: Secondary | ICD-10-CM | POA: Diagnosis not present

## 2021-04-03 DIAGNOSIS — J9600 Acute respiratory failure, unspecified whether with hypoxia or hypercapnia: Secondary | ICD-10-CM | POA: Diagnosis not present

## 2021-04-03 DIAGNOSIS — E871 Hypo-osmolality and hyponatremia: Secondary | ICD-10-CM | POA: Diagnosis not present

## 2021-04-03 DIAGNOSIS — R600 Localized edema: Secondary | ICD-10-CM | POA: Diagnosis not present

## 2021-04-03 DIAGNOSIS — G9341 Metabolic encephalopathy: Secondary | ICD-10-CM | POA: Diagnosis not present

## 2021-04-03 DIAGNOSIS — J9690 Respiratory failure, unspecified, unspecified whether with hypoxia or hypercapnia: Secondary | ICD-10-CM | POA: Diagnosis not present

## 2021-04-03 DIAGNOSIS — J90 Pleural effusion, not elsewhere classified: Secondary | ICD-10-CM | POA: Diagnosis not present

## 2021-04-03 DIAGNOSIS — K631 Perforation of intestine (nontraumatic): Secondary | ICD-10-CM | POA: Diagnosis not present

## 2021-04-03 DIAGNOSIS — I824Z2 Acute embolism and thrombosis of unspecified deep veins of left distal lower extremity: Secondary | ICD-10-CM | POA: Diagnosis not present

## 2021-04-03 DIAGNOSIS — D62 Acute posthemorrhagic anemia: Secondary | ICD-10-CM | POA: Diagnosis not present

## 2021-04-03 DIAGNOSIS — Z9911 Dependence on respirator [ventilator] status: Secondary | ICD-10-CM | POA: Diagnosis not present

## 2021-04-04 DIAGNOSIS — R451 Restlessness and agitation: Secondary | ICD-10-CM | POA: Diagnosis not present

## 2021-04-04 DIAGNOSIS — E871 Hypo-osmolality and hyponatremia: Secondary | ICD-10-CM | POA: Diagnosis not present

## 2021-04-04 DIAGNOSIS — R410 Disorientation, unspecified: Secondary | ICD-10-CM | POA: Diagnosis not present

## 2021-04-04 DIAGNOSIS — Z7289 Other problems related to lifestyle: Secondary | ICD-10-CM | POA: Diagnosis not present

## 2021-04-04 DIAGNOSIS — Z9911 Dependence on respirator [ventilator] status: Secondary | ICD-10-CM | POA: Diagnosis not present

## 2021-04-04 DIAGNOSIS — Z4682 Encounter for fitting and adjustment of non-vascular catheter: Secondary | ICD-10-CM | POA: Diagnosis not present

## 2021-04-04 DIAGNOSIS — R509 Fever, unspecified: Secondary | ICD-10-CM | POA: Diagnosis not present

## 2021-04-04 DIAGNOSIS — J9600 Acute respiratory failure, unspecified whether with hypoxia or hypercapnia: Secondary | ICD-10-CM | POA: Diagnosis not present

## 2021-04-04 DIAGNOSIS — E878 Other disorders of electrolyte and fluid balance, not elsewhere classified: Secondary | ICD-10-CM | POA: Diagnosis not present

## 2021-04-04 DIAGNOSIS — K631 Perforation of intestine (nontraumatic): Secondary | ICD-10-CM | POA: Diagnosis not present

## 2021-04-04 DIAGNOSIS — R000 Tachycardia, unspecified: Secondary | ICD-10-CM | POA: Diagnosis not present

## 2021-04-04 DIAGNOSIS — J90 Pleural effusion, not elsewhere classified: Secondary | ICD-10-CM | POA: Diagnosis not present

## 2021-04-04 DIAGNOSIS — G9341 Metabolic encephalopathy: Secondary | ICD-10-CM | POA: Diagnosis not present

## 2021-04-04 DIAGNOSIS — G479 Sleep disorder, unspecified: Secondary | ICD-10-CM | POA: Diagnosis not present

## 2021-04-04 DIAGNOSIS — D62 Acute posthemorrhagic anemia: Secondary | ICD-10-CM | POA: Diagnosis not present

## 2021-04-05 DIAGNOSIS — D539 Nutritional anemia, unspecified: Secondary | ICD-10-CM | POA: Diagnosis not present

## 2021-04-05 DIAGNOSIS — E871 Hypo-osmolality and hyponatremia: Secondary | ICD-10-CM | POA: Diagnosis not present

## 2021-04-05 DIAGNOSIS — Z9911 Dependence on respirator [ventilator] status: Secondary | ICD-10-CM | POA: Diagnosis not present

## 2021-04-05 DIAGNOSIS — J9811 Atelectasis: Secondary | ICD-10-CM | POA: Diagnosis not present

## 2021-04-05 DIAGNOSIS — G928 Other toxic encephalopathy: Secondary | ICD-10-CM | POA: Diagnosis not present

## 2021-04-05 DIAGNOSIS — G9341 Metabolic encephalopathy: Secondary | ICD-10-CM | POA: Diagnosis not present

## 2021-04-05 DIAGNOSIS — J90 Pleural effusion, not elsewhere classified: Secondary | ICD-10-CM | POA: Diagnosis not present

## 2021-04-05 DIAGNOSIS — D62 Acute posthemorrhagic anemia: Secondary | ICD-10-CM | POA: Diagnosis not present

## 2021-04-05 DIAGNOSIS — Z4682 Encounter for fitting and adjustment of non-vascular catheter: Secondary | ICD-10-CM | POA: Diagnosis not present

## 2021-04-05 DIAGNOSIS — R451 Restlessness and agitation: Secondary | ICD-10-CM | POA: Diagnosis not present

## 2021-04-05 DIAGNOSIS — K651 Peritoneal abscess: Secondary | ICD-10-CM | POA: Diagnosis not present

## 2021-04-05 DIAGNOSIS — R410 Disorientation, unspecified: Secondary | ICD-10-CM | POA: Diagnosis not present

## 2021-04-05 DIAGNOSIS — K631 Perforation of intestine (nontraumatic): Secondary | ICD-10-CM | POA: Diagnosis not present

## 2021-04-05 DIAGNOSIS — J9600 Acute respiratory failure, unspecified whether with hypoxia or hypercapnia: Secondary | ICD-10-CM | POA: Diagnosis not present

## 2021-04-05 DIAGNOSIS — R918 Other nonspecific abnormal finding of lung field: Secondary | ICD-10-CM | POA: Diagnosis not present

## 2021-04-06 DIAGNOSIS — D62 Acute posthemorrhagic anemia: Secondary | ICD-10-CM | POA: Diagnosis not present

## 2021-04-06 DIAGNOSIS — I2510 Atherosclerotic heart disease of native coronary artery without angina pectoris: Secondary | ICD-10-CM | POA: Diagnosis not present

## 2021-04-06 DIAGNOSIS — E878 Other disorders of electrolyte and fluid balance, not elsewhere classified: Secondary | ICD-10-CM | POA: Diagnosis not present

## 2021-04-06 DIAGNOSIS — J9600 Acute respiratory failure, unspecified whether with hypoxia or hypercapnia: Secondary | ICD-10-CM | POA: Diagnosis not present

## 2021-04-06 DIAGNOSIS — R451 Restlessness and agitation: Secondary | ICD-10-CM | POA: Diagnosis not present

## 2021-04-06 DIAGNOSIS — R5082 Postprocedural fever: Secondary | ICD-10-CM | POA: Diagnosis not present

## 2021-04-06 DIAGNOSIS — Z4682 Encounter for fitting and adjustment of non-vascular catheter: Secondary | ICD-10-CM | POA: Diagnosis not present

## 2021-04-06 DIAGNOSIS — Z930 Tracheostomy status: Secondary | ICD-10-CM | POA: Diagnosis not present

## 2021-04-06 DIAGNOSIS — R918 Other nonspecific abnormal finding of lung field: Secondary | ICD-10-CM | POA: Diagnosis not present

## 2021-04-06 DIAGNOSIS — J449 Chronic obstructive pulmonary disease, unspecified: Secondary | ICD-10-CM | POA: Diagnosis not present

## 2021-04-06 DIAGNOSIS — J90 Pleural effusion, not elsewhere classified: Secondary | ICD-10-CM | POA: Diagnosis not present

## 2021-04-06 DIAGNOSIS — R933 Abnormal findings on diagnostic imaging of other parts of digestive tract: Secondary | ICD-10-CM | POA: Diagnosis not present

## 2021-04-06 DIAGNOSIS — Z9911 Dependence on respirator [ventilator] status: Secondary | ICD-10-CM | POA: Diagnosis not present

## 2021-04-06 DIAGNOSIS — G9341 Metabolic encephalopathy: Secondary | ICD-10-CM | POA: Diagnosis not present

## 2021-04-06 DIAGNOSIS — J9811 Atelectasis: Secondary | ICD-10-CM | POA: Diagnosis not present

## 2021-04-06 DIAGNOSIS — K631 Perforation of intestine (nontraumatic): Secondary | ICD-10-CM | POA: Diagnosis not present

## 2021-04-06 DIAGNOSIS — R188 Other ascites: Secondary | ICD-10-CM | POA: Diagnosis not present

## 2021-04-07 DIAGNOSIS — J811 Chronic pulmonary edema: Secondary | ICD-10-CM | POA: Diagnosis not present

## 2021-04-07 DIAGNOSIS — Z4682 Encounter for fitting and adjustment of non-vascular catheter: Secondary | ICD-10-CM | POA: Diagnosis not present

## 2021-04-07 DIAGNOSIS — Z9911 Dependence on respirator [ventilator] status: Secondary | ICD-10-CM | POA: Diagnosis not present

## 2021-04-07 DIAGNOSIS — R451 Restlessness and agitation: Secondary | ICD-10-CM | POA: Diagnosis not present

## 2021-04-07 DIAGNOSIS — E878 Other disorders of electrolyte and fluid balance, not elsewhere classified: Secondary | ICD-10-CM | POA: Diagnosis not present

## 2021-04-07 DIAGNOSIS — R410 Disorientation, unspecified: Secondary | ICD-10-CM | POA: Diagnosis not present

## 2021-04-07 DIAGNOSIS — G9341 Metabolic encephalopathy: Secondary | ICD-10-CM | POA: Diagnosis not present

## 2021-04-07 DIAGNOSIS — D62 Acute posthemorrhagic anemia: Secondary | ICD-10-CM | POA: Diagnosis not present

## 2021-04-07 DIAGNOSIS — J9600 Acute respiratory failure, unspecified whether with hypoxia or hypercapnia: Secondary | ICD-10-CM | POA: Diagnosis not present

## 2021-04-08 DIAGNOSIS — R0689 Other abnormalities of breathing: Secondary | ICD-10-CM | POA: Diagnosis not present

## 2021-04-08 DIAGNOSIS — K631 Perforation of intestine (nontraumatic): Secondary | ICD-10-CM | POA: Diagnosis not present

## 2021-04-08 DIAGNOSIS — J811 Chronic pulmonary edema: Secondary | ICD-10-CM | POA: Diagnosis not present

## 2021-04-08 DIAGNOSIS — J9811 Atelectasis: Secondary | ICD-10-CM | POA: Diagnosis not present

## 2021-04-08 DIAGNOSIS — R410 Disorientation, unspecified: Secondary | ICD-10-CM | POA: Diagnosis not present

## 2021-04-08 DIAGNOSIS — J90 Pleural effusion, not elsewhere classified: Secondary | ICD-10-CM | POA: Diagnosis not present

## 2021-04-08 DIAGNOSIS — I82492 Acute embolism and thrombosis of other specified deep vein of left lower extremity: Secondary | ICD-10-CM | POA: Diagnosis not present

## 2021-04-09 DIAGNOSIS — L02211 Cutaneous abscess of abdominal wall: Secondary | ICD-10-CM | POA: Diagnosis not present

## 2021-04-09 DIAGNOSIS — J90 Pleural effusion, not elsewhere classified: Secondary | ICD-10-CM | POA: Diagnosis not present

## 2021-04-09 DIAGNOSIS — E8770 Fluid overload, unspecified: Secondary | ICD-10-CM | POA: Diagnosis not present

## 2021-04-09 DIAGNOSIS — R0989 Other specified symptoms and signs involving the circulatory and respiratory systems: Secondary | ICD-10-CM | POA: Diagnosis not present

## 2021-04-09 DIAGNOSIS — N390 Urinary tract infection, site not specified: Secondary | ICD-10-CM | POA: Diagnosis not present

## 2021-04-09 DIAGNOSIS — R410 Disorientation, unspecified: Secondary | ICD-10-CM | POA: Diagnosis not present

## 2021-04-09 DIAGNOSIS — R451 Restlessness and agitation: Secondary | ICD-10-CM | POA: Diagnosis not present

## 2021-04-09 DIAGNOSIS — R0689 Other abnormalities of breathing: Secondary | ICD-10-CM | POA: Diagnosis not present

## 2021-04-09 DIAGNOSIS — J811 Chronic pulmonary edema: Secondary | ICD-10-CM | POA: Diagnosis not present

## 2021-04-09 DIAGNOSIS — K631 Perforation of intestine (nontraumatic): Secondary | ICD-10-CM | POA: Diagnosis not present

## 2021-04-09 DIAGNOSIS — R140 Abdominal distension (gaseous): Secondary | ICD-10-CM | POA: Diagnosis not present

## 2021-04-10 DIAGNOSIS — I824Z1 Acute embolism and thrombosis of unspecified deep veins of right distal lower extremity: Secondary | ICD-10-CM | POA: Diagnosis not present

## 2021-04-10 DIAGNOSIS — N390 Urinary tract infection, site not specified: Secondary | ICD-10-CM | POA: Diagnosis not present

## 2021-04-10 DIAGNOSIS — Z4682 Encounter for fitting and adjustment of non-vascular catheter: Secondary | ICD-10-CM | POA: Diagnosis not present

## 2021-04-10 DIAGNOSIS — J90 Pleural effusion, not elsewhere classified: Secondary | ICD-10-CM | POA: Diagnosis not present

## 2021-04-10 DIAGNOSIS — E8770 Fluid overload, unspecified: Secondary | ICD-10-CM | POA: Diagnosis not present

## 2021-04-10 DIAGNOSIS — Z9911 Dependence on respirator [ventilator] status: Secondary | ICD-10-CM | POA: Diagnosis not present

## 2021-04-10 DIAGNOSIS — I871 Compression of vein: Secondary | ICD-10-CM | POA: Diagnosis not present

## 2021-04-10 DIAGNOSIS — R0689 Other abnormalities of breathing: Secondary | ICD-10-CM | POA: Diagnosis not present

## 2021-04-10 DIAGNOSIS — K631 Perforation of intestine (nontraumatic): Secondary | ICD-10-CM | POA: Diagnosis not present

## 2021-04-11 DIAGNOSIS — E876 Hypokalemia: Secondary | ICD-10-CM | POA: Diagnosis not present

## 2021-04-11 DIAGNOSIS — E8770 Fluid overload, unspecified: Secondary | ICD-10-CM | POA: Diagnosis not present

## 2021-04-11 DIAGNOSIS — R410 Disorientation, unspecified: Secondary | ICD-10-CM | POA: Diagnosis not present

## 2021-04-11 DIAGNOSIS — Z4682 Encounter for fitting and adjustment of non-vascular catheter: Secondary | ICD-10-CM | POA: Diagnosis not present

## 2021-04-11 DIAGNOSIS — R188 Other ascites: Secondary | ICD-10-CM | POA: Diagnosis not present

## 2021-04-11 DIAGNOSIS — R0689 Other abnormalities of breathing: Secondary | ICD-10-CM | POA: Diagnosis not present

## 2021-04-11 DIAGNOSIS — R197 Diarrhea, unspecified: Secondary | ICD-10-CM | POA: Diagnosis not present

## 2021-04-11 DIAGNOSIS — R918 Other nonspecific abnormal finding of lung field: Secondary | ICD-10-CM | POA: Diagnosis not present

## 2021-04-11 DIAGNOSIS — Z9911 Dependence on respirator [ventilator] status: Secondary | ICD-10-CM | POA: Diagnosis not present

## 2021-04-11 DIAGNOSIS — K631 Perforation of intestine (nontraumatic): Secondary | ICD-10-CM | POA: Diagnosis not present

## 2021-04-11 DIAGNOSIS — R451 Restlessness and agitation: Secondary | ICD-10-CM | POA: Diagnosis not present

## 2021-04-12 DIAGNOSIS — N390 Urinary tract infection, site not specified: Secondary | ICD-10-CM | POA: Diagnosis not present

## 2021-04-12 DIAGNOSIS — J90 Pleural effusion, not elsewhere classified: Secondary | ICD-10-CM | POA: Diagnosis not present

## 2021-04-12 DIAGNOSIS — R451 Restlessness and agitation: Secondary | ICD-10-CM | POA: Diagnosis not present

## 2021-04-12 DIAGNOSIS — R918 Other nonspecific abnormal finding of lung field: Secondary | ICD-10-CM | POA: Diagnosis not present

## 2021-04-12 DIAGNOSIS — J984 Other disorders of lung: Secondary | ICD-10-CM | POA: Diagnosis not present

## 2021-04-12 DIAGNOSIS — E43 Unspecified severe protein-calorie malnutrition: Secondary | ICD-10-CM | POA: Diagnosis not present

## 2021-04-12 DIAGNOSIS — J9811 Atelectasis: Secondary | ICD-10-CM | POA: Diagnosis not present

## 2021-04-12 DIAGNOSIS — K631 Perforation of intestine (nontraumatic): Secondary | ICD-10-CM | POA: Diagnosis not present

## 2021-04-12 DIAGNOSIS — R509 Fever, unspecified: Secondary | ICD-10-CM | POA: Diagnosis not present

## 2021-04-12 DIAGNOSIS — K651 Peritoneal abscess: Secondary | ICD-10-CM | POA: Diagnosis not present

## 2021-04-12 DIAGNOSIS — R410 Disorientation, unspecified: Secondary | ICD-10-CM | POA: Diagnosis not present

## 2021-04-12 DIAGNOSIS — R000 Tachycardia, unspecified: Secondary | ICD-10-CM | POA: Diagnosis not present

## 2021-04-12 DIAGNOSIS — R0689 Other abnormalities of breathing: Secondary | ICD-10-CM | POA: Diagnosis not present

## 2021-04-12 DIAGNOSIS — E8770 Fluid overload, unspecified: Secondary | ICD-10-CM | POA: Diagnosis not present

## 2021-04-12 DIAGNOSIS — J432 Centrilobular emphysema: Secondary | ICD-10-CM | POA: Diagnosis not present

## 2021-04-13 DIAGNOSIS — R410 Disorientation, unspecified: Secondary | ICD-10-CM | POA: Diagnosis not present

## 2021-04-13 DIAGNOSIS — E8770 Fluid overload, unspecified: Secondary | ICD-10-CM | POA: Diagnosis not present

## 2021-04-13 DIAGNOSIS — Z79899 Other long term (current) drug therapy: Secondary | ICD-10-CM | POA: Diagnosis not present

## 2021-04-13 DIAGNOSIS — Z9911 Dependence on respirator [ventilator] status: Secondary | ICD-10-CM | POA: Diagnosis not present

## 2021-04-13 DIAGNOSIS — R451 Restlessness and agitation: Secondary | ICD-10-CM | POA: Diagnosis not present

## 2021-04-13 DIAGNOSIS — R0689 Other abnormalities of breathing: Secondary | ICD-10-CM | POA: Diagnosis not present

## 2021-04-13 DIAGNOSIS — N390 Urinary tract infection, site not specified: Secondary | ICD-10-CM | POA: Diagnosis not present

## 2021-04-13 DIAGNOSIS — Z4682 Encounter for fitting and adjustment of non-vascular catheter: Secondary | ICD-10-CM | POA: Diagnosis not present

## 2021-04-13 DIAGNOSIS — K631 Perforation of intestine (nontraumatic): Secondary | ICD-10-CM | POA: Diagnosis not present

## 2021-04-14 DIAGNOSIS — E8770 Fluid overload, unspecified: Secondary | ICD-10-CM | POA: Diagnosis not present

## 2021-04-14 DIAGNOSIS — N390 Urinary tract infection, site not specified: Secondary | ICD-10-CM | POA: Diagnosis not present

## 2021-04-14 DIAGNOSIS — K631 Perforation of intestine (nontraumatic): Secondary | ICD-10-CM | POA: Diagnosis not present

## 2021-04-14 DIAGNOSIS — R451 Restlessness and agitation: Secondary | ICD-10-CM | POA: Diagnosis not present

## 2021-04-14 DIAGNOSIS — Z4682 Encounter for fitting and adjustment of non-vascular catheter: Secondary | ICD-10-CM | POA: Diagnosis not present

## 2021-04-14 DIAGNOSIS — Z79899 Other long term (current) drug therapy: Secondary | ICD-10-CM | POA: Diagnosis not present

## 2021-04-14 DIAGNOSIS — R0689 Other abnormalities of breathing: Secondary | ICD-10-CM | POA: Diagnosis not present

## 2021-04-14 DIAGNOSIS — Z9911 Dependence on respirator [ventilator] status: Secondary | ICD-10-CM | POA: Diagnosis not present

## 2021-04-14 DIAGNOSIS — R410 Disorientation, unspecified: Secondary | ICD-10-CM | POA: Diagnosis not present

## 2021-04-14 DIAGNOSIS — E872 Acidosis: Secondary | ICD-10-CM | POA: Diagnosis not present

## 2021-04-15 DIAGNOSIS — Z79899 Other long term (current) drug therapy: Secondary | ICD-10-CM | POA: Diagnosis not present

## 2021-04-15 DIAGNOSIS — J9811 Atelectasis: Secondary | ICD-10-CM | POA: Diagnosis not present

## 2021-04-15 DIAGNOSIS — K631 Perforation of intestine (nontraumatic): Secondary | ICD-10-CM | POA: Diagnosis not present

## 2021-04-15 DIAGNOSIS — J9601 Acute respiratory failure with hypoxia: Secondary | ICD-10-CM | POA: Diagnosis not present

## 2021-04-15 DIAGNOSIS — Z4682 Encounter for fitting and adjustment of non-vascular catheter: Secondary | ICD-10-CM | POA: Diagnosis not present

## 2021-04-15 DIAGNOSIS — S40021A Contusion of right upper arm, initial encounter: Secondary | ICD-10-CM | POA: Diagnosis not present

## 2021-04-15 DIAGNOSIS — I6782 Cerebral ischemia: Secondary | ICD-10-CM | POA: Diagnosis not present

## 2021-04-15 DIAGNOSIS — Z9911 Dependence on respirator [ventilator] status: Secondary | ICD-10-CM | POA: Diagnosis not present

## 2021-04-15 DIAGNOSIS — R410 Disorientation, unspecified: Secondary | ICD-10-CM | POA: Diagnosis not present

## 2021-04-15 DIAGNOSIS — G319 Degenerative disease of nervous system, unspecified: Secondary | ICD-10-CM | POA: Diagnosis not present

## 2021-04-15 DIAGNOSIS — E8770 Fluid overload, unspecified: Secondary | ICD-10-CM | POA: Diagnosis not present

## 2021-04-15 DIAGNOSIS — E43 Unspecified severe protein-calorie malnutrition: Secondary | ICD-10-CM | POA: Diagnosis not present

## 2021-04-15 DIAGNOSIS — R451 Restlessness and agitation: Secondary | ICD-10-CM | POA: Diagnosis not present

## 2021-04-16 DIAGNOSIS — R410 Disorientation, unspecified: Secondary | ICD-10-CM | POA: Diagnosis not present

## 2021-04-16 DIAGNOSIS — J9601 Acute respiratory failure with hypoxia: Secondary | ICD-10-CM | POA: Diagnosis not present

## 2021-04-16 DIAGNOSIS — K611 Rectal abscess: Secondary | ICD-10-CM | POA: Diagnosis not present

## 2021-04-16 DIAGNOSIS — Z9911 Dependence on respirator [ventilator] status: Secondary | ICD-10-CM | POA: Diagnosis not present

## 2021-04-16 DIAGNOSIS — E43 Unspecified severe protein-calorie malnutrition: Secondary | ICD-10-CM | POA: Diagnosis not present

## 2021-04-16 DIAGNOSIS — R918 Other nonspecific abnormal finding of lung field: Secondary | ICD-10-CM | POA: Diagnosis not present

## 2021-04-16 DIAGNOSIS — R451 Restlessness and agitation: Secondary | ICD-10-CM | POA: Diagnosis not present

## 2021-04-16 DIAGNOSIS — I2510 Atherosclerotic heart disease of native coronary artery without angina pectoris: Secondary | ICD-10-CM | POA: Diagnosis not present

## 2021-04-16 DIAGNOSIS — Z79899 Other long term (current) drug therapy: Secondary | ICD-10-CM | POA: Diagnosis not present

## 2021-04-16 DIAGNOSIS — R6521 Severe sepsis with septic shock: Secondary | ICD-10-CM | POA: Diagnosis not present

## 2021-04-16 DIAGNOSIS — J90 Pleural effusion, not elsewhere classified: Secondary | ICD-10-CM | POA: Diagnosis not present

## 2021-04-17 DIAGNOSIS — R918 Other nonspecific abnormal finding of lung field: Secondary | ICD-10-CM | POA: Diagnosis not present

## 2021-04-17 DIAGNOSIS — E43 Unspecified severe protein-calorie malnutrition: Secondary | ICD-10-CM | POA: Diagnosis not present

## 2021-04-17 DIAGNOSIS — Z9911 Dependence on respirator [ventilator] status: Secondary | ICD-10-CM | POA: Diagnosis not present

## 2021-04-17 DIAGNOSIS — J9601 Acute respiratory failure with hypoxia: Secondary | ICD-10-CM | POA: Diagnosis not present

## 2021-04-17 DIAGNOSIS — R451 Restlessness and agitation: Secondary | ICD-10-CM | POA: Diagnosis not present

## 2021-04-17 DIAGNOSIS — R410 Disorientation, unspecified: Secondary | ICD-10-CM | POA: Diagnosis not present

## 2021-04-17 DIAGNOSIS — J90 Pleural effusion, not elsewhere classified: Secondary | ICD-10-CM | POA: Diagnosis not present

## 2021-04-17 DIAGNOSIS — E870 Hyperosmolality and hypernatremia: Secondary | ICD-10-CM | POA: Diagnosis not present

## 2021-04-17 DIAGNOSIS — R6521 Severe sepsis with septic shock: Secondary | ICD-10-CM | POA: Diagnosis not present

## 2021-04-17 DIAGNOSIS — Z79899 Other long term (current) drug therapy: Secondary | ICD-10-CM | POA: Diagnosis not present

## 2021-04-18 DIAGNOSIS — R6521 Severe sepsis with septic shock: Secondary | ICD-10-CM | POA: Diagnosis not present

## 2021-04-18 DIAGNOSIS — E43 Unspecified severe protein-calorie malnutrition: Secondary | ICD-10-CM | POA: Diagnosis not present

## 2021-04-18 DIAGNOSIS — J9601 Acute respiratory failure with hypoxia: Secondary | ICD-10-CM | POA: Diagnosis not present

## 2021-04-18 DIAGNOSIS — Z4682 Encounter for fitting and adjustment of non-vascular catheter: Secondary | ICD-10-CM | POA: Diagnosis not present

## 2021-04-18 DIAGNOSIS — Z9911 Dependence on respirator [ventilator] status: Secondary | ICD-10-CM | POA: Diagnosis not present

## 2021-04-18 DIAGNOSIS — R451 Restlessness and agitation: Secondary | ICD-10-CM | POA: Diagnosis not present

## 2021-04-19 DIAGNOSIS — R410 Disorientation, unspecified: Secondary | ICD-10-CM | POA: Diagnosis not present

## 2021-04-19 DIAGNOSIS — R918 Other nonspecific abnormal finding of lung field: Secondary | ICD-10-CM | POA: Diagnosis not present

## 2021-04-19 DIAGNOSIS — R6521 Severe sepsis with septic shock: Secondary | ICD-10-CM | POA: Diagnosis not present

## 2021-04-19 DIAGNOSIS — Z79899 Other long term (current) drug therapy: Secondary | ICD-10-CM | POA: Diagnosis not present

## 2021-04-19 DIAGNOSIS — J9601 Acute respiratory failure with hypoxia: Secondary | ICD-10-CM | POA: Diagnosis not present

## 2021-04-19 DIAGNOSIS — Z9911 Dependence on respirator [ventilator] status: Secondary | ICD-10-CM | POA: Diagnosis not present

## 2021-04-19 DIAGNOSIS — R000 Tachycardia, unspecified: Secondary | ICD-10-CM | POA: Diagnosis not present

## 2021-04-19 DIAGNOSIS — J811 Chronic pulmonary edema: Secondary | ICD-10-CM | POA: Diagnosis not present

## 2021-04-19 DIAGNOSIS — E43 Unspecified severe protein-calorie malnutrition: Secondary | ICD-10-CM | POA: Diagnosis not present

## 2021-04-20 DIAGNOSIS — Z9911 Dependence on respirator [ventilator] status: Secondary | ICD-10-CM | POA: Diagnosis not present

## 2021-04-20 DIAGNOSIS — E43 Unspecified severe protein-calorie malnutrition: Secondary | ICD-10-CM | POA: Diagnosis not present

## 2021-04-20 DIAGNOSIS — J9601 Acute respiratory failure with hypoxia: Secondary | ICD-10-CM | POA: Diagnosis not present

## 2021-04-20 DIAGNOSIS — R918 Other nonspecific abnormal finding of lung field: Secondary | ICD-10-CM | POA: Diagnosis not present

## 2021-04-20 DIAGNOSIS — R6521 Severe sepsis with septic shock: Secondary | ICD-10-CM | POA: Diagnosis not present

## 2021-04-21 DIAGNOSIS — E43 Unspecified severe protein-calorie malnutrition: Secondary | ICD-10-CM | POA: Diagnosis not present

## 2021-04-21 DIAGNOSIS — I10 Essential (primary) hypertension: Secondary | ICD-10-CM | POA: Diagnosis not present

## 2021-04-21 DIAGNOSIS — R451 Restlessness and agitation: Secondary | ICD-10-CM | POA: Diagnosis not present

## 2021-04-21 DIAGNOSIS — Z9911 Dependence on respirator [ventilator] status: Secondary | ICD-10-CM | POA: Diagnosis not present

## 2021-04-21 DIAGNOSIS — R0989 Other specified symptoms and signs involving the circulatory and respiratory systems: Secondary | ICD-10-CM | POA: Diagnosis not present

## 2021-04-21 DIAGNOSIS — J9601 Acute respiratory failure with hypoxia: Secondary | ICD-10-CM | POA: Diagnosis not present

## 2021-04-21 DIAGNOSIS — R000 Tachycardia, unspecified: Secondary | ICD-10-CM | POA: Diagnosis not present

## 2021-04-21 DIAGNOSIS — Z4682 Encounter for fitting and adjustment of non-vascular catheter: Secondary | ICD-10-CM | POA: Diagnosis not present

## 2021-04-21 DIAGNOSIS — R6521 Severe sepsis with septic shock: Secondary | ICD-10-CM | POA: Diagnosis not present

## 2021-04-21 DIAGNOSIS — Z452 Encounter for adjustment and management of vascular access device: Secondary | ICD-10-CM | POA: Diagnosis not present

## 2021-04-22 DIAGNOSIS — R451 Restlessness and agitation: Secondary | ICD-10-CM | POA: Diagnosis not present

## 2021-04-22 DIAGNOSIS — R918 Other nonspecific abnormal finding of lung field: Secondary | ICD-10-CM | POA: Diagnosis not present

## 2021-04-22 DIAGNOSIS — Z79899 Other long term (current) drug therapy: Secondary | ICD-10-CM | POA: Diagnosis not present

## 2021-04-22 DIAGNOSIS — Z4682 Encounter for fitting and adjustment of non-vascular catheter: Secondary | ICD-10-CM | POA: Diagnosis not present

## 2021-04-22 DIAGNOSIS — Z9911 Dependence on respirator [ventilator] status: Secondary | ICD-10-CM | POA: Diagnosis not present

## 2021-04-22 DIAGNOSIS — D62 Acute posthemorrhagic anemia: Secondary | ICD-10-CM | POA: Diagnosis not present

## 2021-04-22 DIAGNOSIS — R410 Disorientation, unspecified: Secondary | ICD-10-CM | POA: Diagnosis not present

## 2021-04-22 DIAGNOSIS — J9600 Acute respiratory failure, unspecified whether with hypoxia or hypercapnia: Secondary | ICD-10-CM | POA: Diagnosis not present

## 2021-04-22 DIAGNOSIS — Z930 Tracheostomy status: Secondary | ICD-10-CM | POA: Diagnosis not present

## 2021-04-22 DIAGNOSIS — K631 Perforation of intestine (nontraumatic): Secondary | ICD-10-CM | POA: Diagnosis not present

## 2021-04-23 DIAGNOSIS — R410 Disorientation, unspecified: Secondary | ICD-10-CM | POA: Diagnosis not present

## 2021-04-23 DIAGNOSIS — J90 Pleural effusion, not elsewhere classified: Secondary | ICD-10-CM | POA: Diagnosis not present

## 2021-04-23 DIAGNOSIS — E278 Other specified disorders of adrenal gland: Secondary | ICD-10-CM | POA: Diagnosis not present

## 2021-04-23 DIAGNOSIS — D62 Acute posthemorrhagic anemia: Secondary | ICD-10-CM | POA: Diagnosis not present

## 2021-04-23 DIAGNOSIS — I6523 Occlusion and stenosis of bilateral carotid arteries: Secondary | ICD-10-CM | POA: Diagnosis not present

## 2021-04-23 DIAGNOSIS — R918 Other nonspecific abnormal finding of lung field: Secondary | ICD-10-CM | POA: Diagnosis not present

## 2021-04-23 DIAGNOSIS — K5730 Diverticulosis of large intestine without perforation or abscess without bleeding: Secondary | ICD-10-CM | POA: Diagnosis not present

## 2021-04-23 DIAGNOSIS — Z9911 Dependence on respirator [ventilator] status: Secondary | ICD-10-CM | POA: Diagnosis not present

## 2021-04-23 DIAGNOSIS — J9600 Acute respiratory failure, unspecified whether with hypoxia or hypercapnia: Secondary | ICD-10-CM | POA: Diagnosis not present

## 2021-04-23 DIAGNOSIS — K8689 Other specified diseases of pancreas: Secondary | ICD-10-CM | POA: Diagnosis not present

## 2021-04-23 DIAGNOSIS — G9389 Other specified disorders of brain: Secondary | ICD-10-CM | POA: Diagnosis not present

## 2021-04-23 DIAGNOSIS — J342 Deviated nasal septum: Secondary | ICD-10-CM | POA: Diagnosis not present

## 2021-04-23 DIAGNOSIS — Z79899 Other long term (current) drug therapy: Secondary | ICD-10-CM | POA: Diagnosis not present

## 2021-04-23 DIAGNOSIS — Z930 Tracheostomy status: Secondary | ICD-10-CM | POA: Diagnosis not present

## 2021-04-23 DIAGNOSIS — R451 Restlessness and agitation: Secondary | ICD-10-CM | POA: Diagnosis not present

## 2021-04-24 DIAGNOSIS — R451 Restlessness and agitation: Secondary | ICD-10-CM | POA: Diagnosis not present

## 2021-04-24 DIAGNOSIS — Z9911 Dependence on respirator [ventilator] status: Secondary | ICD-10-CM | POA: Diagnosis not present

## 2021-04-24 DIAGNOSIS — R410 Disorientation, unspecified: Secondary | ICD-10-CM | POA: Diagnosis not present

## 2021-04-24 DIAGNOSIS — Z930 Tracheostomy status: Secondary | ICD-10-CM | POA: Diagnosis not present

## 2021-04-24 DIAGNOSIS — J9600 Acute respiratory failure, unspecified whether with hypoxia or hypercapnia: Secondary | ICD-10-CM | POA: Diagnosis not present

## 2021-04-24 DIAGNOSIS — D62 Acute posthemorrhagic anemia: Secondary | ICD-10-CM | POA: Diagnosis not present

## 2021-04-24 DIAGNOSIS — R918 Other nonspecific abnormal finding of lung field: Secondary | ICD-10-CM | POA: Diagnosis not present

## 2021-04-24 DIAGNOSIS — G479 Sleep disorder, unspecified: Secondary | ICD-10-CM | POA: Diagnosis not present

## 2021-04-24 DIAGNOSIS — Z4682 Encounter for fitting and adjustment of non-vascular catheter: Secondary | ICD-10-CM | POA: Diagnosis not present

## 2021-04-25 DIAGNOSIS — J9600 Acute respiratory failure, unspecified whether with hypoxia or hypercapnia: Secondary | ICD-10-CM | POA: Diagnosis not present

## 2021-04-25 DIAGNOSIS — Z9911 Dependence on respirator [ventilator] status: Secondary | ICD-10-CM | POA: Diagnosis not present

## 2021-04-25 DIAGNOSIS — D62 Acute posthemorrhagic anemia: Secondary | ICD-10-CM | POA: Diagnosis not present

## 2021-04-25 DIAGNOSIS — Z930 Tracheostomy status: Secondary | ICD-10-CM | POA: Diagnosis not present

## 2021-04-26 DIAGNOSIS — Z930 Tracheostomy status: Secondary | ICD-10-CM | POA: Diagnosis not present

## 2021-04-26 DIAGNOSIS — R451 Restlessness and agitation: Secondary | ICD-10-CM | POA: Diagnosis not present

## 2021-04-26 DIAGNOSIS — R410 Disorientation, unspecified: Secondary | ICD-10-CM | POA: Diagnosis not present

## 2021-04-26 DIAGNOSIS — J9600 Acute respiratory failure, unspecified whether with hypoxia or hypercapnia: Secondary | ICD-10-CM | POA: Diagnosis not present

## 2021-04-26 DIAGNOSIS — Z9911 Dependence on respirator [ventilator] status: Secondary | ICD-10-CM | POA: Diagnosis not present

## 2021-04-26 DIAGNOSIS — I10 Essential (primary) hypertension: Secondary | ICD-10-CM | POA: Diagnosis not present

## 2021-04-26 DIAGNOSIS — D62 Acute posthemorrhagic anemia: Secondary | ICD-10-CM | POA: Diagnosis not present

## 2021-04-27 DIAGNOSIS — D62 Acute posthemorrhagic anemia: Secondary | ICD-10-CM | POA: Diagnosis not present

## 2021-04-27 DIAGNOSIS — Z930 Tracheostomy status: Secondary | ICD-10-CM | POA: Diagnosis not present

## 2021-04-27 DIAGNOSIS — Z9911 Dependence on respirator [ventilator] status: Secondary | ICD-10-CM | POA: Diagnosis not present

## 2021-04-27 DIAGNOSIS — J9600 Acute respiratory failure, unspecified whether with hypoxia or hypercapnia: Secondary | ICD-10-CM | POA: Diagnosis not present

## 2021-04-28 DIAGNOSIS — D62 Acute posthemorrhagic anemia: Secondary | ICD-10-CM | POA: Diagnosis not present

## 2021-04-28 DIAGNOSIS — R0602 Shortness of breath: Secondary | ICD-10-CM | POA: Diagnosis not present

## 2021-04-28 DIAGNOSIS — J9600 Acute respiratory failure, unspecified whether with hypoxia or hypercapnia: Secondary | ICD-10-CM | POA: Diagnosis not present

## 2021-04-28 DIAGNOSIS — Z930 Tracheostomy status: Secondary | ICD-10-CM | POA: Diagnosis not present

## 2021-04-28 DIAGNOSIS — R918 Other nonspecific abnormal finding of lung field: Secondary | ICD-10-CM | POA: Diagnosis not present

## 2021-04-29 DIAGNOSIS — I871 Compression of vein: Secondary | ICD-10-CM | POA: Diagnosis not present

## 2021-04-29 DIAGNOSIS — R188 Other ascites: Secondary | ICD-10-CM | POA: Diagnosis not present

## 2021-04-29 DIAGNOSIS — A419 Sepsis, unspecified organism: Secondary | ICD-10-CM | POA: Diagnosis not present

## 2021-04-29 DIAGNOSIS — K631 Perforation of intestine (nontraumatic): Secondary | ICD-10-CM | POA: Diagnosis not present

## 2021-04-29 DIAGNOSIS — K651 Peritoneal abscess: Secondary | ICD-10-CM | POA: Diagnosis not present

## 2021-04-29 DIAGNOSIS — R410 Disorientation, unspecified: Secondary | ICD-10-CM | POA: Diagnosis not present

## 2021-04-29 DIAGNOSIS — R918 Other nonspecific abnormal finding of lung field: Secondary | ICD-10-CM | POA: Diagnosis not present

## 2021-04-29 DIAGNOSIS — Z9911 Dependence on respirator [ventilator] status: Secondary | ICD-10-CM | POA: Diagnosis not present

## 2021-04-30 DIAGNOSIS — A499 Bacterial infection, unspecified: Secondary | ICD-10-CM | POA: Diagnosis not present

## 2021-04-30 DIAGNOSIS — R188 Other ascites: Secondary | ICD-10-CM | POA: Diagnosis not present

## 2021-04-30 DIAGNOSIS — T8143XA Infection following a procedure, organ and space surgical site, initial encounter: Secondary | ICD-10-CM | POA: Diagnosis not present

## 2021-04-30 DIAGNOSIS — J9811 Atelectasis: Secondary | ICD-10-CM | POA: Diagnosis not present

## 2021-04-30 DIAGNOSIS — Z792 Long term (current) use of antibiotics: Secondary | ICD-10-CM | POA: Diagnosis not present

## 2021-04-30 DIAGNOSIS — K631 Perforation of intestine (nontraumatic): Secondary | ICD-10-CM | POA: Diagnosis not present

## 2021-05-01 DIAGNOSIS — Z792 Long term (current) use of antibiotics: Secondary | ICD-10-CM | POA: Diagnosis not present

## 2021-05-01 DIAGNOSIS — K631 Perforation of intestine (nontraumatic): Secondary | ICD-10-CM | POA: Diagnosis not present

## 2021-05-01 DIAGNOSIS — G479 Sleep disorder, unspecified: Secondary | ICD-10-CM | POA: Diagnosis not present

## 2021-05-01 DIAGNOSIS — B9689 Other specified bacterial agents as the cause of diseases classified elsewhere: Secondary | ICD-10-CM | POA: Diagnosis not present

## 2021-05-01 DIAGNOSIS — K651 Peritoneal abscess: Secondary | ICD-10-CM | POA: Diagnosis not present

## 2021-05-01 DIAGNOSIS — T8143XA Infection following a procedure, organ and space surgical site, initial encounter: Secondary | ICD-10-CM | POA: Diagnosis not present

## 2021-05-01 DIAGNOSIS — J90 Pleural effusion, not elsewhere classified: Secondary | ICD-10-CM | POA: Diagnosis not present

## 2021-05-01 DIAGNOSIS — R410 Disorientation, unspecified: Secondary | ICD-10-CM | POA: Diagnosis not present

## 2021-05-01 DIAGNOSIS — R451 Restlessness and agitation: Secondary | ICD-10-CM | POA: Diagnosis not present

## 2021-05-02 DIAGNOSIS — Z9911 Dependence on respirator [ventilator] status: Secondary | ICD-10-CM | POA: Diagnosis not present

## 2021-05-02 DIAGNOSIS — R188 Other ascites: Secondary | ICD-10-CM | POA: Diagnosis not present

## 2021-05-02 DIAGNOSIS — J811 Chronic pulmonary edema: Secondary | ICD-10-CM | POA: Diagnosis not present

## 2021-05-02 DIAGNOSIS — Z4682 Encounter for fitting and adjustment of non-vascular catheter: Secondary | ICD-10-CM | POA: Diagnosis not present

## 2021-05-02 DIAGNOSIS — R2231 Localized swelling, mass and lump, right upper limb: Secondary | ICD-10-CM | POA: Diagnosis not present

## 2021-05-03 DIAGNOSIS — R451 Restlessness and agitation: Secondary | ICD-10-CM | POA: Diagnosis not present

## 2021-05-03 DIAGNOSIS — A499 Bacterial infection, unspecified: Secondary | ICD-10-CM | POA: Diagnosis not present

## 2021-05-03 DIAGNOSIS — Z792 Long term (current) use of antibiotics: Secondary | ICD-10-CM | POA: Diagnosis not present

## 2021-05-03 DIAGNOSIS — G479 Sleep disorder, unspecified: Secondary | ICD-10-CM | POA: Diagnosis not present

## 2021-05-03 DIAGNOSIS — K631 Perforation of intestine (nontraumatic): Secondary | ICD-10-CM | POA: Diagnosis not present

## 2021-05-03 DIAGNOSIS — J90 Pleural effusion, not elsewhere classified: Secondary | ICD-10-CM | POA: Diagnosis not present

## 2021-05-03 DIAGNOSIS — T8143XA Infection following a procedure, organ and space surgical site, initial encounter: Secondary | ICD-10-CM | POA: Diagnosis not present

## 2021-05-04 DIAGNOSIS — Z9911 Dependence on respirator [ventilator] status: Secondary | ICD-10-CM | POA: Diagnosis not present

## 2021-05-04 DIAGNOSIS — J90 Pleural effusion, not elsewhere classified: Secondary | ICD-10-CM | POA: Diagnosis not present

## 2021-05-06 DIAGNOSIS — K631 Perforation of intestine (nontraumatic): Secondary | ICD-10-CM | POA: Diagnosis not present

## 2021-05-06 DIAGNOSIS — K651 Peritoneal abscess: Secondary | ICD-10-CM | POA: Diagnosis not present

## 2021-05-06 DIAGNOSIS — A419 Sepsis, unspecified organism: Secondary | ICD-10-CM | POA: Diagnosis not present

## 2021-05-06 DIAGNOSIS — R410 Disorientation, unspecified: Secondary | ICD-10-CM | POA: Diagnosis not present

## 2021-05-06 DIAGNOSIS — R451 Restlessness and agitation: Secondary | ICD-10-CM | POA: Diagnosis not present

## 2021-05-06 DIAGNOSIS — J9601 Acute respiratory failure with hypoxia: Secondary | ICD-10-CM | POA: Diagnosis not present

## 2021-05-06 DIAGNOSIS — B9689 Other specified bacterial agents as the cause of diseases classified elsewhere: Secondary | ICD-10-CM | POA: Diagnosis not present

## 2021-05-06 DIAGNOSIS — G479 Sleep disorder, unspecified: Secondary | ICD-10-CM | POA: Diagnosis not present

## 2021-05-06 DIAGNOSIS — D62 Acute posthemorrhagic anemia: Secondary | ICD-10-CM | POA: Diagnosis not present

## 2021-05-06 DIAGNOSIS — J90 Pleural effusion, not elsewhere classified: Secondary | ICD-10-CM | POA: Diagnosis not present

## 2021-05-07 DIAGNOSIS — J9601 Acute respiratory failure with hypoxia: Secondary | ICD-10-CM | POA: Diagnosis not present

## 2021-05-07 DIAGNOSIS — A419 Sepsis, unspecified organism: Secondary | ICD-10-CM | POA: Diagnosis not present

## 2021-05-07 DIAGNOSIS — G479 Sleep disorder, unspecified: Secondary | ICD-10-CM | POA: Diagnosis not present

## 2021-05-07 DIAGNOSIS — D62 Acute posthemorrhagic anemia: Secondary | ICD-10-CM | POA: Diagnosis not present

## 2021-05-07 DIAGNOSIS — R451 Restlessness and agitation: Secondary | ICD-10-CM | POA: Diagnosis not present

## 2021-05-07 DIAGNOSIS — R6339 Other feeding difficulties: Secondary | ICD-10-CM | POA: Diagnosis not present

## 2021-05-07 DIAGNOSIS — R410 Disorientation, unspecified: Secondary | ICD-10-CM | POA: Diagnosis not present

## 2021-05-07 DIAGNOSIS — Z431 Encounter for attention to gastrostomy: Secondary | ICD-10-CM | POA: Diagnosis not present

## 2021-05-08 DIAGNOSIS — J9601 Acute respiratory failure with hypoxia: Secondary | ICD-10-CM | POA: Diagnosis not present

## 2021-05-08 DIAGNOSIS — K631 Perforation of intestine (nontraumatic): Secondary | ICD-10-CM | POA: Diagnosis not present

## 2021-05-08 DIAGNOSIS — D62 Acute posthemorrhagic anemia: Secondary | ICD-10-CM | POA: Diagnosis not present

## 2021-05-08 DIAGNOSIS — K651 Peritoneal abscess: Secondary | ICD-10-CM | POA: Diagnosis not present

## 2021-05-08 DIAGNOSIS — G9340 Encephalopathy, unspecified: Secondary | ICD-10-CM | POA: Diagnosis not present

## 2021-05-08 DIAGNOSIS — B379 Candidiasis, unspecified: Secondary | ICD-10-CM | POA: Diagnosis not present

## 2021-05-08 DIAGNOSIS — A419 Sepsis, unspecified organism: Secondary | ICD-10-CM | POA: Diagnosis not present

## 2021-05-09 DIAGNOSIS — Z9049 Acquired absence of other specified parts of digestive tract: Secondary | ICD-10-CM | POA: Diagnosis not present

## 2021-05-09 DIAGNOSIS — Z930 Tracheostomy status: Secondary | ICD-10-CM | POA: Diagnosis not present

## 2021-05-09 DIAGNOSIS — K9429 Other complications of gastrostomy: Secondary | ICD-10-CM | POA: Diagnosis not present

## 2021-05-09 DIAGNOSIS — R188 Other ascites: Secondary | ICD-10-CM | POA: Diagnosis not present

## 2021-05-09 DIAGNOSIS — A419 Sepsis, unspecified organism: Secondary | ICD-10-CM | POA: Diagnosis not present

## 2021-05-09 DIAGNOSIS — R933 Abnormal findings on diagnostic imaging of other parts of digestive tract: Secondary | ICD-10-CM | POA: Diagnosis not present

## 2021-05-09 DIAGNOSIS — D62 Acute posthemorrhagic anemia: Secondary | ICD-10-CM | POA: Diagnosis not present

## 2021-05-09 DIAGNOSIS — K631 Perforation of intestine (nontraumatic): Secondary | ICD-10-CM | POA: Diagnosis not present

## 2021-05-09 DIAGNOSIS — Z931 Gastrostomy status: Secondary | ICD-10-CM | POA: Diagnosis not present

## 2021-05-09 DIAGNOSIS — J9601 Acute respiratory failure with hypoxia: Secondary | ICD-10-CM | POA: Diagnosis not present

## 2021-05-09 DIAGNOSIS — G9340 Encephalopathy, unspecified: Secondary | ICD-10-CM | POA: Diagnosis not present

## 2021-05-10 DIAGNOSIS — D62 Acute posthemorrhagic anemia: Secondary | ICD-10-CM | POA: Diagnosis not present

## 2021-05-10 DIAGNOSIS — G9340 Encephalopathy, unspecified: Secondary | ICD-10-CM | POA: Diagnosis not present

## 2021-05-10 DIAGNOSIS — A419 Sepsis, unspecified organism: Secondary | ICD-10-CM | POA: Diagnosis not present

## 2021-05-10 DIAGNOSIS — J9601 Acute respiratory failure with hypoxia: Secondary | ICD-10-CM | POA: Diagnosis not present

## 2021-05-11 DIAGNOSIS — G9340 Encephalopathy, unspecified: Secondary | ICD-10-CM | POA: Diagnosis not present

## 2021-05-11 DIAGNOSIS — K651 Peritoneal abscess: Secondary | ICD-10-CM | POA: Diagnosis not present

## 2021-05-11 DIAGNOSIS — R918 Other nonspecific abnormal finding of lung field: Secondary | ICD-10-CM | POA: Diagnosis not present

## 2021-05-11 DIAGNOSIS — D62 Acute posthemorrhagic anemia: Secondary | ICD-10-CM | POA: Diagnosis not present

## 2021-05-11 DIAGNOSIS — J9601 Acute respiratory failure with hypoxia: Secondary | ICD-10-CM | POA: Diagnosis not present

## 2021-05-11 DIAGNOSIS — K9423 Gastrostomy malfunction: Secondary | ICD-10-CM | POA: Diagnosis not present

## 2021-05-11 DIAGNOSIS — J90 Pleural effusion, not elsewhere classified: Secondary | ICD-10-CM | POA: Diagnosis not present

## 2021-05-12 DIAGNOSIS — J9601 Acute respiratory failure with hypoxia: Secondary | ICD-10-CM | POA: Diagnosis not present

## 2021-05-12 DIAGNOSIS — G9340 Encephalopathy, unspecified: Secondary | ICD-10-CM | POA: Diagnosis not present

## 2021-05-12 DIAGNOSIS — A419 Sepsis, unspecified organism: Secondary | ICD-10-CM | POA: Diagnosis not present

## 2021-05-12 DIAGNOSIS — D62 Acute posthemorrhagic anemia: Secondary | ICD-10-CM | POA: Diagnosis not present

## 2021-05-13 DIAGNOSIS — E876 Hypokalemia: Secondary | ICD-10-CM | POA: Diagnosis not present

## 2021-05-13 DIAGNOSIS — J9612 Chronic respiratory failure with hypercapnia: Secondary | ICD-10-CM | POA: Diagnosis not present

## 2021-05-13 DIAGNOSIS — K651 Peritoneal abscess: Secondary | ICD-10-CM | POA: Diagnosis not present

## 2021-05-13 DIAGNOSIS — K631 Perforation of intestine (nontraumatic): Secondary | ICD-10-CM | POA: Diagnosis not present

## 2021-05-13 DIAGNOSIS — T8143XA Infection following a procedure, organ and space surgical site, initial encounter: Secondary | ICD-10-CM | POA: Diagnosis not present

## 2021-05-13 DIAGNOSIS — J984 Other disorders of lung: Secondary | ICD-10-CM | POA: Diagnosis not present

## 2021-05-13 DIAGNOSIS — Z792 Long term (current) use of antibiotics: Secondary | ICD-10-CM | POA: Diagnosis not present

## 2021-05-14 DIAGNOSIS — D62 Acute posthemorrhagic anemia: Secondary | ICD-10-CM | POA: Diagnosis not present

## 2021-05-14 DIAGNOSIS — E876 Hypokalemia: Secondary | ICD-10-CM | POA: Diagnosis not present

## 2021-05-14 DIAGNOSIS — J449 Chronic obstructive pulmonary disease, unspecified: Secondary | ICD-10-CM | POA: Diagnosis not present

## 2021-05-14 DIAGNOSIS — J9612 Chronic respiratory failure with hypercapnia: Secondary | ICD-10-CM | POA: Diagnosis not present

## 2021-05-17 DIAGNOSIS — E876 Hypokalemia: Secondary | ICD-10-CM | POA: Diagnosis not present

## 2021-05-17 DIAGNOSIS — Z79899 Other long term (current) drug therapy: Secondary | ICD-10-CM | POA: Diagnosis not present

## 2021-05-17 DIAGNOSIS — R410 Disorientation, unspecified: Secondary | ICD-10-CM | POA: Diagnosis not present

## 2021-05-17 DIAGNOSIS — R451 Restlessness and agitation: Secondary | ICD-10-CM | POA: Diagnosis not present

## 2021-05-17 DIAGNOSIS — J9612 Chronic respiratory failure with hypercapnia: Secondary | ICD-10-CM | POA: Diagnosis not present

## 2021-05-17 DIAGNOSIS — E43 Unspecified severe protein-calorie malnutrition: Secondary | ICD-10-CM | POA: Diagnosis not present

## 2021-05-20 DIAGNOSIS — K631 Perforation of intestine (nontraumatic): Secondary | ICD-10-CM | POA: Diagnosis not present

## 2021-05-20 DIAGNOSIS — R410 Disorientation, unspecified: Secondary | ICD-10-CM | POA: Diagnosis not present

## 2021-05-20 DIAGNOSIS — D539 Nutritional anemia, unspecified: Secondary | ICD-10-CM | POA: Diagnosis not present

## 2021-05-20 DIAGNOSIS — R933 Abnormal findings on diagnostic imaging of other parts of digestive tract: Secondary | ICD-10-CM | POA: Diagnosis not present

## 2021-05-20 DIAGNOSIS — K651 Peritoneal abscess: Secondary | ICD-10-CM | POA: Diagnosis not present

## 2021-05-20 DIAGNOSIS — R188 Other ascites: Secondary | ICD-10-CM | POA: Diagnosis not present

## 2021-05-24 DIAGNOSIS — R262 Difficulty in walking, not elsewhere classified: Secondary | ICD-10-CM | POA: Diagnosis not present

## 2021-05-24 DIAGNOSIS — K631 Perforation of intestine (nontraumatic): Secondary | ICD-10-CM | POA: Diagnosis not present

## 2021-05-24 DIAGNOSIS — J708 Respiratory conditions due to other specified external agents: Secondary | ICD-10-CM | POA: Diagnosis not present

## 2021-05-24 DIAGNOSIS — Z720 Tobacco use: Secondary | ICD-10-CM | POA: Diagnosis not present

## 2021-05-24 DIAGNOSIS — I82409 Acute embolism and thrombosis of unspecified deep veins of unspecified lower extremity: Secondary | ICD-10-CM | POA: Diagnosis not present

## 2021-05-24 DIAGNOSIS — J9602 Acute respiratory failure with hypercapnia: Secondary | ICD-10-CM | POA: Diagnosis not present

## 2021-05-24 DIAGNOSIS — R279 Unspecified lack of coordination: Secondary | ICD-10-CM | POA: Diagnosis not present

## 2021-05-24 DIAGNOSIS — L98429 Non-pressure chronic ulcer of back with unspecified severity: Secondary | ICD-10-CM | POA: Diagnosis not present

## 2021-05-24 DIAGNOSIS — L02211 Cutaneous abscess of abdominal wall: Secondary | ICD-10-CM | POA: Diagnosis not present

## 2021-05-24 DIAGNOSIS — E46 Unspecified protein-calorie malnutrition: Secondary | ICD-10-CM | POA: Diagnosis not present

## 2021-05-24 DIAGNOSIS — J449 Chronic obstructive pulmonary disease, unspecified: Secondary | ICD-10-CM | POA: Diagnosis not present

## 2021-05-24 DIAGNOSIS — M6281 Muscle weakness (generalized): Secondary | ICD-10-CM | POA: Diagnosis not present

## 2021-05-24 DIAGNOSIS — R197 Diarrhea, unspecified: Secondary | ICD-10-CM | POA: Diagnosis not present

## 2021-05-24 DIAGNOSIS — Z5189 Encounter for other specified aftercare: Secondary | ICD-10-CM | POA: Diagnosis not present

## 2021-05-24 DIAGNOSIS — D649 Anemia, unspecified: Secondary | ICD-10-CM | POA: Diagnosis not present

## 2021-05-24 DIAGNOSIS — K651 Peritoneal abscess: Secondary | ICD-10-CM | POA: Diagnosis not present

## 2021-05-24 DIAGNOSIS — R339 Retention of urine, unspecified: Secondary | ICD-10-CM | POA: Diagnosis not present

## 2021-05-24 DIAGNOSIS — Z931 Gastrostomy status: Secondary | ICD-10-CM | POA: Diagnosis not present

## 2021-05-24 DIAGNOSIS — B3741 Candidal cystitis and urethritis: Secondary | ICD-10-CM | POA: Diagnosis not present

## 2021-05-24 DIAGNOSIS — R5381 Other malaise: Secondary | ICD-10-CM | POA: Diagnosis not present

## 2021-05-30 DIAGNOSIS — K651 Peritoneal abscess: Secondary | ICD-10-CM | POA: Diagnosis not present

## 2021-05-30 DIAGNOSIS — L98429 Non-pressure chronic ulcer of back with unspecified severity: Secondary | ICD-10-CM | POA: Diagnosis not present

## 2021-05-31 DIAGNOSIS — Z5189 Encounter for other specified aftercare: Secondary | ICD-10-CM | POA: Diagnosis not present

## 2021-06-13 DIAGNOSIS — L98429 Non-pressure chronic ulcer of back with unspecified severity: Secondary | ICD-10-CM | POA: Diagnosis not present

## 2021-06-13 DIAGNOSIS — K651 Peritoneal abscess: Secondary | ICD-10-CM | POA: Diagnosis not present

## 2021-06-18 DIAGNOSIS — Z5189 Encounter for other specified aftercare: Secondary | ICD-10-CM | POA: Diagnosis not present

## 2021-06-18 DIAGNOSIS — Z09 Encounter for follow-up examination after completed treatment for conditions other than malignant neoplasm: Secondary | ICD-10-CM | POA: Diagnosis not present

## 2021-06-19 DIAGNOSIS — K651 Peritoneal abscess: Secondary | ICD-10-CM | POA: Diagnosis not present

## 2021-06-19 DIAGNOSIS — L98429 Non-pressure chronic ulcer of back with unspecified severity: Secondary | ICD-10-CM | POA: Diagnosis not present

## 2021-06-21 DIAGNOSIS — B3749 Other urogenital candidiasis: Secondary | ICD-10-CM | POA: Diagnosis not present

## 2021-06-21 DIAGNOSIS — R339 Retention of urine, unspecified: Secondary | ICD-10-CM | POA: Diagnosis not present

## 2021-06-21 DIAGNOSIS — E7800 Pure hypercholesterolemia, unspecified: Secondary | ICD-10-CM | POA: Diagnosis not present

## 2021-06-21 DIAGNOSIS — I824Z3 Acute embolism and thrombosis of unspecified deep veins of distal lower extremity, bilateral: Secondary | ICD-10-CM | POA: Diagnosis not present

## 2021-06-21 DIAGNOSIS — D539 Nutritional anemia, unspecified: Secondary | ICD-10-CM | POA: Diagnosis not present

## 2021-06-21 DIAGNOSIS — Z931 Gastrostomy status: Secondary | ICD-10-CM | POA: Diagnosis not present

## 2021-06-21 DIAGNOSIS — L89152 Pressure ulcer of sacral region, stage 2: Secondary | ICD-10-CM | POA: Diagnosis not present

## 2021-06-21 DIAGNOSIS — F17210 Nicotine dependence, cigarettes, uncomplicated: Secondary | ICD-10-CM | POA: Diagnosis not present

## 2021-06-21 DIAGNOSIS — K3532 Acute appendicitis with perforation and localized peritonitis, without abscess: Secondary | ICD-10-CM | POA: Diagnosis not present

## 2021-06-21 DIAGNOSIS — J449 Chronic obstructive pulmonary disease, unspecified: Secondary | ICD-10-CM | POA: Diagnosis not present

## 2021-06-21 DIAGNOSIS — I10 Essential (primary) hypertension: Secondary | ICD-10-CM | POA: Diagnosis not present

## 2021-06-27 DIAGNOSIS — Z09 Encounter for follow-up examination after completed treatment for conditions other than malignant neoplasm: Secondary | ICD-10-CM | POA: Diagnosis not present

## 2021-06-27 DIAGNOSIS — Z5189 Encounter for other specified aftercare: Secondary | ICD-10-CM | POA: Diagnosis not present

## 2021-07-03 DIAGNOSIS — N138 Other obstructive and reflux uropathy: Secondary | ICD-10-CM | POA: Diagnosis not present

## 2021-08-12 DIAGNOSIS — R339 Retention of urine, unspecified: Secondary | ICD-10-CM | POA: Diagnosis not present

## 2021-08-12 DIAGNOSIS — N138 Other obstructive and reflux uropathy: Secondary | ICD-10-CM | POA: Diagnosis not present

## 2021-09-11 DIAGNOSIS — N138 Other obstructive and reflux uropathy: Secondary | ICD-10-CM | POA: Diagnosis not present

## 2021-09-25 DIAGNOSIS — N138 Other obstructive and reflux uropathy: Secondary | ICD-10-CM | POA: Diagnosis not present

## 2021-09-25 DIAGNOSIS — Z466 Encounter for fitting and adjustment of urinary device: Secondary | ICD-10-CM | POA: Diagnosis not present

## 2021-10-08 DIAGNOSIS — Z79899 Other long term (current) drug therapy: Secondary | ICD-10-CM | POA: Diagnosis not present

## 2021-10-08 DIAGNOSIS — Z7951 Long term (current) use of inhaled steroids: Secondary | ICD-10-CM | POA: Diagnosis not present

## 2021-10-08 DIAGNOSIS — N320 Bladder-neck obstruction: Secondary | ICD-10-CM | POA: Diagnosis not present

## 2021-10-08 DIAGNOSIS — N138 Other obstructive and reflux uropathy: Secondary | ICD-10-CM | POA: Diagnosis not present

## 2021-10-08 DIAGNOSIS — Z20822 Contact with and (suspected) exposure to covid-19: Secondary | ICD-10-CM | POA: Diagnosis not present

## 2021-10-08 DIAGNOSIS — J449 Chronic obstructive pulmonary disease, unspecified: Secondary | ICD-10-CM | POA: Diagnosis not present

## 2021-10-08 DIAGNOSIS — I10 Essential (primary) hypertension: Secondary | ICD-10-CM | POA: Diagnosis not present

## 2021-10-08 DIAGNOSIS — Z960 Presence of urogenital implants: Secondary | ICD-10-CM | POA: Diagnosis not present

## 2021-10-09 DIAGNOSIS — Z79899 Other long term (current) drug therapy: Secondary | ICD-10-CM | POA: Diagnosis not present

## 2021-10-09 DIAGNOSIS — Z20822 Contact with and (suspected) exposure to covid-19: Secondary | ICD-10-CM | POA: Diagnosis not present

## 2021-10-09 DIAGNOSIS — J449 Chronic obstructive pulmonary disease, unspecified: Secondary | ICD-10-CM | POA: Diagnosis not present

## 2021-10-09 DIAGNOSIS — N138 Other obstructive and reflux uropathy: Secondary | ICD-10-CM | POA: Diagnosis not present

## 2021-10-09 DIAGNOSIS — Z960 Presence of urogenital implants: Secondary | ICD-10-CM | POA: Diagnosis not present

## 2021-10-09 DIAGNOSIS — I10 Essential (primary) hypertension: Secondary | ICD-10-CM | POA: Diagnosis not present

## 2021-10-09 DIAGNOSIS — Z7951 Long term (current) use of inhaled steroids: Secondary | ICD-10-CM | POA: Diagnosis not present

## 2021-10-21 DIAGNOSIS — N138 Other obstructive and reflux uropathy: Secondary | ICD-10-CM | POA: Diagnosis not present

## 2021-10-30 DIAGNOSIS — R338 Other retention of urine: Secondary | ICD-10-CM | POA: Diagnosis not present

## 2021-10-30 DIAGNOSIS — Z7951 Long term (current) use of inhaled steroids: Secondary | ICD-10-CM | POA: Diagnosis not present

## 2021-10-30 DIAGNOSIS — Z978 Presence of other specified devices: Secondary | ICD-10-CM | POA: Diagnosis not present

## 2021-10-30 DIAGNOSIS — Z79899 Other long term (current) drug therapy: Secondary | ICD-10-CM | POA: Diagnosis not present

## 2021-11-01 DIAGNOSIS — R339 Retention of urine, unspecified: Secondary | ICD-10-CM | POA: Diagnosis not present

## 2021-11-05 ENCOUNTER — Other Ambulatory Visit: Payer: Self-pay | Admitting: Family Medicine

## 2021-11-05 ENCOUNTER — Ambulatory Visit
Admission: RE | Admit: 2021-11-05 | Discharge: 2021-11-05 | Disposition: A | Discharge status: Home / Self Care | Payer: Medicare Other | Source: Ambulatory Visit | Attending: Family Medicine | Admitting: Family Medicine

## 2021-11-05 DIAGNOSIS — R531 Weakness: Secondary | ICD-10-CM | POA: Diagnosis not present

## 2021-11-05 DIAGNOSIS — R109 Unspecified abdominal pain: Secondary | ICD-10-CM | POA: Diagnosis not present

## 2021-11-05 DIAGNOSIS — R339 Retention of urine, unspecified: Secondary | ICD-10-CM | POA: Diagnosis not present

## 2021-11-05 DIAGNOSIS — G629 Polyneuropathy, unspecified: Secondary | ICD-10-CM | POA: Diagnosis not present

## 2021-11-05 DIAGNOSIS — I10 Essential (primary) hypertension: Secondary | ICD-10-CM | POA: Diagnosis not present

## 2021-11-05 DIAGNOSIS — J449 Chronic obstructive pulmonary disease, unspecified: Secondary | ICD-10-CM

## 2021-11-05 DIAGNOSIS — E7800 Pure hypercholesterolemia, unspecified: Secondary | ICD-10-CM | POA: Diagnosis not present

## 2021-11-05 DIAGNOSIS — J309 Allergic rhinitis, unspecified: Secondary | ICD-10-CM | POA: Diagnosis not present

## 2021-11-14 ENCOUNTER — Other Ambulatory Visit: Payer: Self-pay

## 2021-11-14 ENCOUNTER — Ambulatory Visit
Admission: RE | Admit: 2021-11-14 | Discharge: 2021-11-14 | Disposition: A | Discharge status: Home / Self Care | Payer: Medicare Other | Attending: Family Medicine | Admitting: Family Medicine

## 2021-11-14 ENCOUNTER — Ambulatory Visit
Admission: RE | Admit: 2021-11-14 | Discharge: 2021-11-14 | Disposition: A | Discharge status: Home / Self Care | Payer: Medicare Other | Source: Ambulatory Visit | Attending: Family Medicine | Admitting: Family Medicine

## 2021-11-14 ENCOUNTER — Other Ambulatory Visit: Payer: Self-pay | Admitting: Family Medicine

## 2021-11-14 DIAGNOSIS — J449 Chronic obstructive pulmonary disease, unspecified: Secondary | ICD-10-CM

## 2021-11-18 DIAGNOSIS — R531 Weakness: Secondary | ICD-10-CM | POA: Diagnosis not present

## 2021-11-18 DIAGNOSIS — R262 Difficulty in walking, not elsewhere classified: Secondary | ICD-10-CM | POA: Diagnosis not present

## 2021-11-20 DIAGNOSIS — R262 Difficulty in walking, not elsewhere classified: Secondary | ICD-10-CM | POA: Diagnosis not present

## 2021-11-20 DIAGNOSIS — R531 Weakness: Secondary | ICD-10-CM | POA: Diagnosis not present

## 2021-11-28 ENCOUNTER — Institutional Professional Consult (permissible substitution): Payer: Medicare Other | Admitting: Pulmonary Disease

## 2021-11-29 DIAGNOSIS — R531 Weakness: Secondary | ICD-10-CM | POA: Diagnosis not present

## 2021-11-29 DIAGNOSIS — R262 Difficulty in walking, not elsewhere classified: Secondary | ICD-10-CM | POA: Diagnosis not present

## 2021-12-02 DIAGNOSIS — I739 Peripheral vascular disease, unspecified: Secondary | ICD-10-CM | POA: Diagnosis not present

## 2021-12-04 DIAGNOSIS — R262 Difficulty in walking, not elsewhere classified: Secondary | ICD-10-CM | POA: Diagnosis not present

## 2021-12-04 DIAGNOSIS — R531 Weakness: Secondary | ICD-10-CM | POA: Diagnosis not present

## 2021-12-09 DIAGNOSIS — R531 Weakness: Secondary | ICD-10-CM | POA: Diagnosis not present

## 2021-12-09 DIAGNOSIS — R262 Difficulty in walking, not elsewhere classified: Secondary | ICD-10-CM | POA: Diagnosis not present

## 2021-12-11 DIAGNOSIS — R531 Weakness: Secondary | ICD-10-CM | POA: Diagnosis not present

## 2021-12-11 DIAGNOSIS — R262 Difficulty in walking, not elsewhere classified: Secondary | ICD-10-CM | POA: Diagnosis not present

## 2021-12-17 DIAGNOSIS — R531 Weakness: Secondary | ICD-10-CM | POA: Diagnosis not present

## 2021-12-17 DIAGNOSIS — R262 Difficulty in walking, not elsewhere classified: Secondary | ICD-10-CM | POA: Diagnosis not present

## 2021-12-19 DIAGNOSIS — R262 Difficulty in walking, not elsewhere classified: Secondary | ICD-10-CM | POA: Diagnosis not present

## 2021-12-19 DIAGNOSIS — R531 Weakness: Secondary | ICD-10-CM | POA: Diagnosis not present

## 2021-12-20 DIAGNOSIS — Z466 Encounter for fitting and adjustment of urinary device: Secondary | ICD-10-CM | POA: Diagnosis not present

## 2021-12-24 DIAGNOSIS — R262 Difficulty in walking, not elsewhere classified: Secondary | ICD-10-CM | POA: Diagnosis not present

## 2021-12-24 DIAGNOSIS — R531 Weakness: Secondary | ICD-10-CM | POA: Diagnosis not present

## 2021-12-26 ENCOUNTER — Ambulatory Visit: Payer: Medicare Other | Admitting: Internal Medicine

## 2021-12-26 ENCOUNTER — Encounter: Payer: Self-pay | Admitting: Internal Medicine

## 2021-12-26 VITALS — BP 124/80 | HR 112 | Temp 98.1°F | Ht 71.0 in

## 2021-12-26 DIAGNOSIS — J9611 Chronic respiratory failure with hypoxia: Secondary | ICD-10-CM | POA: Diagnosis not present

## 2021-12-26 DIAGNOSIS — F17210 Nicotine dependence, cigarettes, uncomplicated: Secondary | ICD-10-CM | POA: Diagnosis not present

## 2021-12-26 DIAGNOSIS — J449 Chronic obstructive pulmonary disease, unspecified: Secondary | ICD-10-CM | POA: Diagnosis not present

## 2021-12-26 MED ORDER — TRELEGY ELLIPTA 100-62.5-25 MCG/ACT IN AEPB: INHALATION_SPRAY | RESPIRATORY_TRACT | 11 refills | Status: DC

## 2021-12-26 MED ORDER — PREDNISONE 10 MG PO TABS: ORAL_TABLET | ORAL | 0 refills | Status: DC

## 2021-12-26 MED ORDER — TRELEGY ELLIPTA 100-62.5-25 MCG/ACT IN AEPB: 1.0000 | INHALATION_SPRAY | Freq: Every day | RESPIRATORY_TRACT | 0 refills | Status: DC

## 2021-12-26 MED ORDER — ALBUTEROL SULFATE (2.5 MG/3ML) 0.083% IN NEBU: 2.5000 mg | INHALATION_SOLUTION | RESPIRATORY_TRACT | 12 refills | Status: AC | PRN

## 2021-12-26 NOTE — Patient Instructions (Addendum)
Plan A = Automatic = Always=    Trelegy 100 one each am  instead of Wixella - change back if don't like trelegy for any reason ? ?Work on inhaler technique:  relax and gently blow all the way out then take a nice smooth full deep breath back in,   Hold for up to 5 seconds if you can. Blow out thru nose. Rinse and gargle with water when done.  If mouth or throat bother you at all,  try brushing teeth/gums/tongue with arm and hammer toothpaste/ make a slurry and gargle and spit out.  ? ?   ?Plan B = Backup (to supplement plan A, not to replace it) ?Only use your albuterol inhaler as a rescue medication to be used if you can't catch your breath by resting or doing a relaxed purse lip breathing pattern.  ?- The less you use it, the better it will work when you need it. ?- Ok to use the inhaler up to 2 puffs  every 4 hours if you must but call for appointment if use goes up over your usual need ?- Don't leave home without it !!  (think of it like the spare tire for your car)  ? ?Plan C = Crisis (instead of Plan B but only if Plan B stops working) ?- only use your albuterol nebulizer if you first try Plan B and it fails to help > ok to use the nebulizer up to every 4 hours but if start needing it regularly call for immediate appointment ? ?Ok to try albuterol 15 min before an activity (on alternating days)  that you know would usually make you short of breath and see if it makes any difference and if makes none then don't take albuterol after activity unless you can't catch your breath as this means it's the resting that helps, not the albuterol. ?    ? ?Prednisone 10 mg take  4 each am x 2 days,   2 each am x 2 days,  1 each am x 2 days and stop   ? ?The key is to stop smoking completely before smoking completely stops you! ? ?  ?Please schedule a follow up office visit in 6 weeks, call sooner if needed with all medications /inhalers/ solutions in hand so we can verify exactly what you are taking. This includes all  medications from all doctors and over the counters  ? ? ?

## 2021-12-26 NOTE — Assessment & Plan Note (Addendum)
Active smoker ?- 12/26/2021  After extensive coaching inhaler device,  effectiveness =    75% with DPI > try trelegy if doesn't affect bladder function  ? ? Group D in terms of symptom/risk and laba/lama/ICS  therefore appropriate rx at this point >>>  trelegy and approp saba . If not tol trelegy for any reason (including cost) then change back to wixella 250  ? ?Will add pred x 6 days for active rhonchi on exam s purulent sputum ?  ?

## 2021-12-26 NOTE — Progress Notes (Signed)
? ?Mark Stevenson, male    DOB: 19-Jun-1956   MRN: [id number] ? ? ?Brief patient profile:  ?26  yow  active smoker  referred to pulmonary clinic in [place]  12/26/2021 by Dr Donnie Coffin for copd eval  ? ?Out of rest home since  late fall of 2022 p prolonged admit [facility] (don't see this in care everywhere  ? ? ?History of Present Illness  ?12/26/2021  Pulmonary/ 1st office eval/ Arika Mainer / [employer] / still smoking maint on wixella  ?Chief Complaint  ?Patient presents with  ? pulmonary consult  ?  Hx of COPD. C/o sob with exertion.   ?Dyspnea:   PT with 02 drops and no 02 supplement  ?Cough: rattle > milky ?Sleep: on side / bed is flat  ?SABA use:  helps some ? ?   ?No obvious day to day or daytime variability or assoc excess/ purulent sputum or mucus plugs or hemoptysis or cp or chest tightness, subjective wheeze or overt sinus or hb symptoms.  ? ?Sleeping as above without nocturnal  or early am exacerbation  of respiratory  c/o's or need for noct saba. Also denies any obvious fluctuation of symptoms with weather or environmental changes or other aggravating or alleviating factors except as outlined above  ? ?No unusual exposure hx or h/o childhood pna/ asthma or knowledge of premature birth. ? ?Current Allergies, Complete Past Medical History, Past Surgical History, Family History, and Social History were reviewed in [employer] record. ? ?ROS  The following are not active complaints unless bolded ?Hoarseness, sore throat, dysphagia, dental problems, itching, sneezing,  nasal congestion or discharge of excess mucus or purulent secretions, ear ache,   fever, chills, sweats, unintended wt loss or wt gain, classically pleuritic or exertional cp,  orthopnea pnd or arm/hand swelling  or leg swelling, presyncope, palpitations, abdominal pain, anorexia, nausea, vomiting, diarrhea  or change in bowel habits or change in bladder habits, change in stools or change in urine, dysuria, hematuria,   rash, arthralgias, visual complaints, headache, numbness, weakness or ataxia or problems with walking/uses walker or coordination,  change in mood or  memory. ?      ?    ?  ?  ? ?Past Medical History:  ?Diagnosis Date  ? Arthritis   ? Cancer [facility])   ? skin cancer bil knees  ? COPD (chronic obstructive pulmonary disease) ([place])   ? Dyspnea   ? increased exertion; pt states can walk a flight of stairs w/o having to stop to catch breath   ? Hypertension   ? Nicotine dependence, cigarettes, uncomplicated   ? Peripheral neuropathy   ? Pure hypercholesterolemia   ? Sebaceous cyst   ? scrotal area   ? Viral wart   ? ? ?Outpatient Medications Prior to Visit - - NOTE:   Unable to verify as accurately reflecting what pt takes    ?Medication Sig Dispense Refill  ? ADVAIR DISKUS 250-50 MCG/DOSE AEPB Inhale 1 puff into the lungs 2 (two) times daily.     ? calcium carbonate (OS-CAL - DOSED IN MG OF ELEMENTAL CALCIUM) 1250 (500 Ca) MG tablet Take 1 tablet (500 mg of elemental calcium total) by mouth 3 (three) times daily with meals. 90 tablet 0  ? cyclobenzaprine (FLEXERIL) 10 MG tablet Take 1 tablet (10 mg total) by mouth 3 (three) times daily as needed for muscle spasms. 30 tablet 0  ? folic acid (FOLVITE) 1 MG tablet Take 1 tablet (1  mg total) by mouth daily. 30 tablet 0  ? gabapentin (NEURONTIN) 600 MG tablet Take 600 mg by mouth at bedtime.     ? HYDROcodone-acetaminophen (NORCO/VICODIN) 5-325 MG tablet Take 1 tablet by mouth every 6 (six) hours as needed for severe pain. 15 tablet 0  ? loratadine (CLARITIN) 10 MG tablet Take 10 mg by mouth daily as needed for allergies.    ? losartan (COZAAR) 100 MG tablet Take 100 mg by mouth daily.    ? lovastatin (MEVACOR) 20 MG tablet Take 20 mg by mouth daily.     ? nicotine (NICODERM CQ - DOSED IN MG/24 HOURS) 21 mg/24hr patch Place 1 patch (21 mg total) onto the skin daily. 28 patch 0  ? pantoprazole (PROTONIX) 40 MG tablet Take 1 tablet (40 mg total) by mouth daily. 30 tablet 0  ?      0  ? PROAIR HFA 108 (90 Base) MCG/ACT inhaler Inhale 2 puffs into the lungs every 6 (six) hours as needed for shortness of breath or wheezing.    ? tamsulosin (FLOMAX) 0.4 MG CAPS capsule Take 1 capsule (0.4 mg total) by mouth daily after supper. 30 capsule 0  ? thiamine 100 MG tablet Take 1 tablet (100 mg total) by mouth daily. 30 tablet 0  ? triamcinolone cream (KENALOG) 0.1 % Apply 1 application topically daily as needed for rash.    ? ?No facility-administered medications prior to visit.  ? ? ? ?Objective:  ?  ? ?BP 124/80 (BP Location: Left Arm, Cuff Size: Normal)   Pulse (!) 112   Temp 98.1 ?F (36.7 ?C) (Temporal)   Ht '5\' 11"'$  (1.803 m)   SpO2 96%   BMI 23.71 kg/m?  ? ?SpO2: 96 % ? ?Chronically ill w/c bound elderly wm >>> stated age  ? ? ?HEENT : pt wearing mask not removed for exam due to covid - 19 concerns.  ? ? ?NECK :  without JVD/Nodes/TM/ nl carotid upstrokes bilaterally ? ? ?LUNGS: no acc muscle use,  Mild barrel  contour chest wall with bilateral  Distant bs s audible wheeze and  without cough on insp or exp maneuvers  and mild  Hyperresonant  to  percussion bilaterally   ? ? ?CV:  RRR  no s3 or murmur or increase in P2, and no edema  ? ?ABD:  soft and nontender with pos end  insp Hoover's  in the supine position. No bruits or organomegaly appreciated, bowel sounds nl ? ?MS:   Nl gait/  ext warm without deformities, calf tenderness, cyanosis or clubbing ?No obvious joint restrictions  ? ?SKIN: warm and dry without lesions   ? ?NEURO:  alert, approp, nl sensorium with  no motor or cerebellar deficits apparent.  ?    ? ?I personally reviewed images and  radiology impression as follows:  ?CXR:   11/14/21 ?1. Nodular density seen on same day chest radiograph correlates with ?nipple marker. ?2. Similar blunting of bilateral costophrenic sulci, which could ?represent small pleural effusions versus pleural ?thickening/scarring. ?My impression severe copd/ mod T kyphosis  ? ?   ?Assessment  ? ?COPD  clinically severe/ still smoking ?Active smoker ?- 12/26/2021  After extensive coaching inhaler device,  effectiveness =    75% with DPI > try trelegy if doesn't affect bladder function  ? ? Group D in terms of symptom/risk and laba/lama/ICS  therefore appropriate rx at this point >>>  trelegy and approp saba . If not tol trelegy for any reason (  including cost) then change back to wixella 250  ? ?Will add pred x 6 days for active rhonchi on exam s purulent sputum ?  ? ? ?Chronic respiratory failure with hypoxia (HCC) ?Qualified 12/26/2021 :  desat to 87% RA slow pace x 175 ft then on 2lpm cont maint 96% still sob p 175 ? ?rec 2lpm with activity and sleep and ok to titrate daytime to keep sats > 90% goal  ? ? ?  ?Cigarette smoker ?Counseled re importance of smoking cessation but did not meet time criteria for separate billing   ? ?F/u in 6 weeks with all meds in hand using a trust but verify approach to confirm accurate Medication  Reconciliation The principal here is that until we are certain that the  patients are doing what we've asked, it makes no sense to ask them to do more.  ? ?Each maintenance medication was reviewed in detail including emphasizing most importantly the difference between maintenance and prns and under what circumstances the prns are to be triggered using an action plan format where appropriate. ? ?Total time for H and P, chart review, counseling re ABC plan, reviewing dpi/hfa/neb device(s) , directly observing portions of ambulatory 02 saturation study/ and generating customized AVS unique to this new pt  office visit / same day charting  > 60 min  ?     ? ? ? ? ?Christinia Gully, MD ?12/26/2021 ?    ?

## 2021-12-27 ENCOUNTER — Telehealth: Payer: Self-pay | Admitting: Internal Medicine

## 2021-12-27 ENCOUNTER — Encounter: Payer: Self-pay | Admitting: Internal Medicine

## 2021-12-27 DIAGNOSIS — J449 Chronic obstructive pulmonary disease, unspecified: Secondary | ICD-10-CM | POA: Diagnosis not present

## 2021-12-27 DIAGNOSIS — E876 Hypokalemia: Secondary | ICD-10-CM | POA: Diagnosis not present

## 2021-12-27 DIAGNOSIS — R338 Other retention of urine: Secondary | ICD-10-CM | POA: Diagnosis not present

## 2021-12-27 DIAGNOSIS — I10 Essential (primary) hypertension: Secondary | ICD-10-CM | POA: Diagnosis not present

## 2021-12-27 DIAGNOSIS — E871 Hypo-osmolality and hyponatremia: Secondary | ICD-10-CM | POA: Diagnosis not present

## 2021-12-27 DIAGNOSIS — R531 Weakness: Secondary | ICD-10-CM | POA: Diagnosis not present

## 2021-12-27 DIAGNOSIS — R262 Difficulty in walking, not elsewhere classified: Secondary | ICD-10-CM | POA: Diagnosis not present

## 2021-12-27 NOTE — Telephone Encounter (Signed)
Spoke to patient.  ?He is aware that during walk test yesterday, his HR and oxygen both dropped.  ?Advised him to contact cardiology as discussed during visit. He voiced his understanding.  ?Nothing further needed.  ? ?

## 2021-12-27 NOTE — Assessment & Plan Note (Addendum)
Counseled re importance of smoking cessation but did not meet time criteria for separate billing   ? ?F/u in 6 weeks with all meds in hand using a trust but verify approach to confirm accurate Medication  Reconciliation The principal here is that until we are certain that the  patients are doing what we've asked, it makes no sense to ask them to do more.  ? ?Each maintenance medication was reviewed in detail including emphasizing most importantly the difference between maintenance and prns and under what circumstances the prns are to be triggered using an action plan format where appropriate. ? ?Total time for H and P, chart review, counseling re ABC plan, reviewing dpi/hfa/neb device(s) , directly observing portions of ambulatory 02 saturation study/ and generating customized AVS unique to this new pt  office visit / same day charting  > 60 min  ?     ? ?

## 2021-12-27 NOTE — Assessment & Plan Note (Signed)
Qualified 12/26/2021 :  desat to 87% RA slow pace x 175 ft then on 2lpm cont maint 96% still sob p 175 ? ?rec 2lpm with activity and sleep and ok to titrate daytime to keep sats > 90% goal  ? ? ?  ?      ?

## 2021-12-30 ENCOUNTER — Other Ambulatory Visit: Payer: Self-pay

## 2021-12-30 DIAGNOSIS — R531 Weakness: Secondary | ICD-10-CM | POA: Diagnosis not present

## 2021-12-30 DIAGNOSIS — R262 Difficulty in walking, not elsewhere classified: Secondary | ICD-10-CM | POA: Diagnosis not present

## 2021-12-30 DIAGNOSIS — I739 Peripheral vascular disease, unspecified: Secondary | ICD-10-CM

## 2022-01-06 DIAGNOSIS — R531 Weakness: Secondary | ICD-10-CM | POA: Diagnosis not present

## 2022-01-06 DIAGNOSIS — R262 Difficulty in walking, not elsewhere classified: Secondary | ICD-10-CM | POA: Diagnosis not present

## 2022-01-07 DIAGNOSIS — D044 Carcinoma in situ of skin of scalp and neck: Secondary | ICD-10-CM | POA: Diagnosis not present

## 2022-01-07 DIAGNOSIS — D485 Neoplasm of uncertain behavior of skin: Secondary | ICD-10-CM | POA: Diagnosis not present

## 2022-01-08 DIAGNOSIS — Z79899 Other long term (current) drug therapy: Secondary | ICD-10-CM | POA: Diagnosis not present

## 2022-01-08 DIAGNOSIS — N138 Other obstructive and reflux uropathy: Secondary | ICD-10-CM | POA: Diagnosis not present

## 2022-01-09 DIAGNOSIS — R531 Weakness: Secondary | ICD-10-CM | POA: Diagnosis not present

## 2022-01-09 DIAGNOSIS — R262 Difficulty in walking, not elsewhere classified: Secondary | ICD-10-CM | POA: Diagnosis not present

## 2022-01-09 NOTE — Progress Notes (Signed)
?Office Note  ? ? ? ?CC: Decreased heart rate while exercising at pulmonary rehab ?Requesting Provider:  Alroy Stevenson, L.Mark Sa, MD ? ?HPI: Mark Stevenson is a 66 y.o. (15-Oct-1955) male presenting at the request of his pulmonary rehab center after they appreciated a decreased heart rate with ambulation.  Originally, they asked that he see cardiology, however Mark Stevenson elected to follow-up with our office. ? ?Mark Stevenson is s/p angiogram with atherectomy of left superficial femoral artery, drug coated balloon angioplasty of left superficial femoral artery in 2017 by Dr. Trula Stevenson.   ? ?Mark Stevenson was recently started on oxygen due to pulmonary insufficiency.  He continues to smoke between 1 and 1.5 packs/day of cigarettes. Unfortunately, Mark Stevenson's mother passed on his birthday 2 days ago.  He is working through her loss.  Her service is scheduled for next week.  Mark Stevenson's poor health is limiting his activity level.  He recently had to give his dog to his sister.  Per Mark Stevenson, he was afraid of tripping over the [place] terrier due to its small size.  He remains independent, driving himself to and from appointments, but required assistance getting in and out of his car due to his pulmonary insufficiency. ? ?Regarding his bilateral lower extremity peripheral arterial disease, Mark Stevenson remains asymptomatic.  He states he does not walk far enough to elicit claudication symptoms, no rest pain, no tissue loss. ?  ? ?Past Medical History:  ?Diagnosis Date  ? Arthritis   ? Cancer [facility])   ? skin cancer bil knees  ? COPD (chronic obstructive pulmonary disease) ([place])   ? Dyspnea   ? increased exertion; pt states can walk a flight of stairs w/o having to stop to catch breath   ? Hypertension   ? Nicotine dependence, cigarettes, uncomplicated   ? Peripheral neuropathy   ? Pure hypercholesterolemia   ? Sebaceous cyst   ? scrotal area   ? Viral wart   ? ? ?Past Surgical History:  ?Procedure Laterality Date  ? ankles    ? tarsal tunnel surgery bilat  ? BACK  SURGERY  2008 and 10/22/16  ? herniated disc  ? broken knee cap  2002  ? calf surgery Bilateral   ? lengethen the tendons  ? COLONOSCOPY    ? crushed L-4 vertebrae  1980  ? KNEE ARTHROSCOPY    ? x3 bilat/ 2 times left knee  ? KYPHOPLASTY N/A 03/13/2021  ? Procedure: KYPHOPLASTY - T11 and L2;  Surgeon: Hessie Knows, MD;  Location: [facility] ORS;  Service: Orthopedics;  Laterality: N/A;  ? LEG SKIN LESION  BIOPSY / EXCISION    ? Bil knees  ? LUMBAR LAMINECTOMY/DECOMPRESSION MICRODISCECTOMY N/A 10/22/2016  ? Procedure: MICRO LUMBAR DECOMPRESSION L3-L4, L4-L5 REVISION  2 LEVELS;  Surgeon: Susa Day, MD;  Location: [facility];  Service: Orthopedics;  Laterality: N/A;  Requests 150 mins  ? PERIPHERAL VASCULAR CATHETERIZATION N/A 04/22/2016  ? Procedure: Abdominal Aortogram w/ bilateral Lower Extremity Runoff;  Surgeon: Serafina Mitchell, MD;  Location: [facility];  Service: Cardiovascular;  Laterality: N/A;  ? PERIPHERAL VASCULAR CATHETERIZATION Left 04/22/2016  ? Procedure: Peripheral Vascular Atherectomy;  Surgeon: Serafina Mitchell, MD;  Location: [facility];  Service: Cardiovascular;  Laterality: Left;  ? POLYPECTOMY    ? ruptured disc  2008  ? SHOULDER OPEN ROTATOR CUFF REPAIR Right 03/19/2017  ? Procedure: Mini open rotator cuff repair;  Surgeon: Susa Day, MD;  Location: [facility];  Service: Orthopedics;  Laterality: Right;  with block  ?  SHOULDER SURGERY Right 2019  ? tendons in calves lengthened    ? plantar fascitis  ? TOTAL HIP ARTHROPLASTY  2012  ? left  ? ? ?Social History  ? ?Socioeconomic History  ? Marital status: Divorced  ?  Spouse name: Not on file  ? Number of children: Not on file  ? Years of education: Not on file  ? Highest education level: Not on file  ?Occupational History  ? Not on file  ?Tobacco Use  ? Smoking status: Every Day  ?  Packs/day: 2.00  ?  Years: 42.00  ?  Pack years: 84.00  ?  Types: Cigarettes  ? Smokeless tobacco: Never  ? Tobacco comments:  ?  1.5PPD 12/26/2021  ?Vaping Use   ? Vaping Use: Never used  ?Substance and Sexual Activity  ? Alcohol use: Yes  ?  Alcohol/week: 10.0 standard drinks  ?  Types: 10 Shots of liquor per week  ?  Comment: 3 days per week vodka   ? Drug use: No  ? Sexual activity: Not on file  ?Other Topics Concern  ? Not on file  ?Social History Narrative  ? Not on file  ? ?Social Determinants of Health  ? ?Financial Resource Strain: Not on file  ?Food Insecurity: Not on file  ?Transportation Needs: Not on file  ?Physical Activity: Not on file  ?Stress: Not on file  ?Social Connections: Not on file  ?Intimate Partner Violence: Not on file  ? ?Family History  ?Problem Relation Age of Onset  ? Colon cancer Son   ? Liver cancer Son   ? Lung cancer Father   ? Pancreatic cancer Neg Hx   ? Stomach cancer Neg Hx   ? Esophageal cancer Neg Hx   ? Rectal cancer Neg Hx   ? ? ?Current Outpatient Medications  ?Medication Sig Dispense Refill  ? albuterol (PROVENTIL) (2.5 MG/3ML) 0.083% nebulizer solution Take 3 mLs (2.5 mg total) by nebulization every 4 (four) hours as needed for wheezing or shortness of breath. 75 mL 12  ? cyclobenzaprine (FLEXERIL) 10 MG tablet Take 1 tablet (10 mg total) by mouth 3 (three) times daily as needed for muscle spasms. 30 tablet 0  ? Fluticasone-Umeclidin-Vilant (TRELEGY ELLIPTA) 100-62.5-25 MCG/ACT AEPB One click each am 1 each 11  ? Fluticasone-Umeclidin-Vilant (TRELEGY ELLIPTA) 100-62.5-25 MCG/ACT AEPB Inhale 1 puff into the lungs daily. 60 each 0  ? gabapentin (NEURONTIN) 600 MG tablet Take 600 mg by mouth at bedtime.     ? HYDROcodone-acetaminophen (NORCO/VICODIN) 5-325 MG tablet Take 1 tablet by mouth every 6 (six) hours as needed for severe pain. (Patient not taking: Reported on 12/26/2021) 15 tablet 0  ? loratadine (CLARITIN) 10 MG tablet Take 10 mg by mouth daily as needed for allergies.    ? losartan (COZAAR) 100 MG tablet Take 100 mg by mouth daily.    ? lovastatin (MEVACOR) 20 MG tablet Take 20 mg by mouth daily.     ? predniSONE  (DELTASONE) 10 MG tablet Take  4 each am x 2 days,   2 each am x 2 days,  1 each am x 2 days and stop 14 tablet 0  ? PROAIR HFA 108 (90 Base) MCG/ACT inhaler Inhale 2 puffs into the lungs every 6 (six) hours as needed for shortness of breath or wheezing.    ? triamcinolone cream (KENALOG) 0.1 % Apply 1 application topically daily as needed for rash.    ? ?No current facility-administered medications for this visit.  ? ? ?  Allergies  ?Allergen Reactions  ? Amoxicillin-Pot Clavulanate Nausea Only  ? ? ? ?REVIEW OF SYSTEMS:  ?'[X]'$  denotes positive finding, '[ ]'$  denotes negative finding ?Cardiac  Comments:  ?Chest pain or chest pressure:    ?Shortness of breath upon exertion:    ?Short of breath when lying flat:    ?Irregular heart rhythm:    ?    ?Vascular    ?Pain in calf, thigh, or hip brought on by ambulation:    ?Pain in feet at night that wakes you up from your sleep:     ?Blood clot in your veins:    ?Leg swelling:     ?    ?Pulmonary    ?Oxygen at home:    ?Productive cough:     ?Wheezing:     ?    ?Neurologic    ?Sudden weakness in arms or legs:     ?Sudden numbness in arms or legs:     ?Sudden onset of difficulty speaking or slurred speech:    ?Temporary loss of vision in one eye:     ?Problems with dizziness:     ?    ?Gastrointestinal    ?Blood in stool:     ?Vomited blood:     ?    ?Genitourinary    ?Burning when urinating:     ?Blood in urine:    ?    ?Psychiatric    ?Major depression:     ?    ?Hematologic    ?Bleeding problems:    ?Problems with blood clotting too easily:    ?    ?Skin    ?Rashes or ulcers:    ?    ?Constitutional    ?Fever or chills:    ? ? ?PHYSICAL EXAMINATION: ? ?There were no vitals filed for this visit. ? ?General:  WDWN in NAD; vital signs documented above ?Gait: Not observed ?HENT: WNL, normocephalic ?Pulmonary: normal non-labored breathing , without wheezing ?Cardiac: regular  ?Abdomen: soft, NT, no masses ?Skin: without rashes ?Vascular Exam/Pulses: ? Right Left  ?Radial 2+  (normal) 2+ (normal)  ?Ulnar 2+ (normal) 2+ (normal)  ?Femoral    ?Popliteal    ?DP 2+ (normal) 2+ (normal)  ?PT 2+ (normal) 2+ (normal)  ? ?Extremities: without ischemic changes, without Gangrene , without cellulitis

## 2022-01-10 ENCOUNTER — Encounter: Payer: Self-pay | Admitting: Vascular Surgery

## 2022-01-10 ENCOUNTER — Ambulatory Visit: Payer: Medicare Other | Admitting: Vascular Surgery

## 2022-01-10 ENCOUNTER — Ambulatory Visit (HOSPITAL_COMMUNITY)
Admission: RE | Admit: 2022-01-10 | Discharge: 2022-01-10 | Disposition: A | Discharge status: Home / Self Care | Payer: Medicare Other | Source: Ambulatory Visit | Attending: Vascular Surgery | Admitting: Vascular Surgery

## 2022-01-10 VITALS — BP 124/78 | HR 81 | Temp 81.0°F | Resp 20 | Ht 71.0 in | Wt 170.0 lb

## 2022-01-10 DIAGNOSIS — I739 Peripheral vascular disease, unspecified: Secondary | ICD-10-CM | POA: Diagnosis not present

## 2022-01-13 DIAGNOSIS — R262 Difficulty in walking, not elsewhere classified: Secondary | ICD-10-CM | POA: Diagnosis not present

## 2022-01-13 DIAGNOSIS — R531 Weakness: Secondary | ICD-10-CM | POA: Diagnosis not present

## 2022-01-14 DIAGNOSIS — R339 Retention of urine, unspecified: Secondary | ICD-10-CM | POA: Diagnosis not present

## 2022-01-15 DIAGNOSIS — R338 Other retention of urine: Secondary | ICD-10-CM | POA: Diagnosis not present

## 2022-01-15 DIAGNOSIS — E876 Hypokalemia: Secondary | ICD-10-CM | POA: Diagnosis not present

## 2022-01-15 DIAGNOSIS — J449 Chronic obstructive pulmonary disease, unspecified: Secondary | ICD-10-CM | POA: Diagnosis not present

## 2022-01-15 DIAGNOSIS — E871 Hypo-osmolality and hyponatremia: Secondary | ICD-10-CM | POA: Diagnosis not present

## 2022-01-15 DIAGNOSIS — I10 Essential (primary) hypertension: Secondary | ICD-10-CM | POA: Diagnosis not present

## 2022-01-17 ENCOUNTER — Telehealth: Payer: Self-pay | Admitting: Internal Medicine

## 2022-01-17 NOTE — Telephone Encounter (Signed)
Spoke to patient.  ?He stated that he would like to discuss zephyr valve at next OV.  ? ?Routing to Dr. Melvyn Novas as an Juluis Rainier.  ?

## 2022-01-20 ENCOUNTER — Telehealth: Payer: Self-pay | Admitting: Internal Medicine

## 2022-01-20 NOTE — Telephone Encounter (Signed)
Nicotine patches are the best and they are otc  - he can look at the packages and match the strength with how many he smokes per day and replace that amt once he quits. ? ?Zephyr valves are  useful in very select cases but can look into this issue more on return  ?

## 2022-01-20 NOTE — Telephone Encounter (Signed)
Spoke to patient and relayed below message/recommendations. He voiced his understanding.  Nothing further needed.   

## 2022-01-20 NOTE — Telephone Encounter (Signed)
Spoke to patient.  ?He stated that he is having a hard time adapting to oxygen. He would like to discuss  zephyr valve at 02/20/2022 visit. ?He would also like to quit smoking and he would like to know what options are available.  ? ?Dr. Melvyn Novas, please advise. Thanks ?

## 2022-01-21 DIAGNOSIS — R531 Weakness: Secondary | ICD-10-CM | POA: Diagnosis not present

## 2022-01-21 DIAGNOSIS — R262 Difficulty in walking, not elsewhere classified: Secondary | ICD-10-CM | POA: Diagnosis not present

## 2022-01-26 DIAGNOSIS — I10 Essential (primary) hypertension: Secondary | ICD-10-CM | POA: Diagnosis not present

## 2022-01-26 DIAGNOSIS — E876 Hypokalemia: Secondary | ICD-10-CM | POA: Diagnosis not present

## 2022-01-26 DIAGNOSIS — E871 Hypo-osmolality and hyponatremia: Secondary | ICD-10-CM | POA: Diagnosis not present

## 2022-01-26 DIAGNOSIS — R338 Other retention of urine: Secondary | ICD-10-CM | POA: Diagnosis not present

## 2022-01-26 DIAGNOSIS — J449 Chronic obstructive pulmonary disease, unspecified: Secondary | ICD-10-CM | POA: Diagnosis not present

## 2022-01-27 DIAGNOSIS — R262 Difficulty in walking, not elsewhere classified: Secondary | ICD-10-CM | POA: Diagnosis not present

## 2022-01-27 DIAGNOSIS — R531 Weakness: Secondary | ICD-10-CM | POA: Diagnosis not present

## 2022-01-29 DIAGNOSIS — R262 Difficulty in walking, not elsewhere classified: Secondary | ICD-10-CM | POA: Diagnosis not present

## 2022-01-29 DIAGNOSIS — R531 Weakness: Secondary | ICD-10-CM | POA: Diagnosis not present

## 2022-02-03 DIAGNOSIS — R262 Difficulty in walking, not elsewhere classified: Secondary | ICD-10-CM | POA: Diagnosis not present

## 2022-02-03 DIAGNOSIS — R531 Weakness: Secondary | ICD-10-CM | POA: Diagnosis not present

## 2022-02-05 DIAGNOSIS — R262 Difficulty in walking, not elsewhere classified: Secondary | ICD-10-CM | POA: Diagnosis not present

## 2022-02-05 DIAGNOSIS — R531 Weakness: Secondary | ICD-10-CM | POA: Diagnosis not present

## 2022-02-11 DIAGNOSIS — R531 Weakness: Secondary | ICD-10-CM | POA: Diagnosis not present

## 2022-02-11 DIAGNOSIS — R262 Difficulty in walking, not elsewhere classified: Secondary | ICD-10-CM | POA: Diagnosis not present

## 2022-02-14 DIAGNOSIS — E876 Hypokalemia: Secondary | ICD-10-CM | POA: Diagnosis not present

## 2022-02-14 DIAGNOSIS — R338 Other retention of urine: Secondary | ICD-10-CM | POA: Diagnosis not present

## 2022-02-14 DIAGNOSIS — E871 Hypo-osmolality and hyponatremia: Secondary | ICD-10-CM | POA: Diagnosis not present

## 2022-02-14 DIAGNOSIS — J449 Chronic obstructive pulmonary disease, unspecified: Secondary | ICD-10-CM | POA: Diagnosis not present

## 2022-02-14 DIAGNOSIS — I10 Essential (primary) hypertension: Secondary | ICD-10-CM | POA: Diagnosis not present

## 2022-02-18 DIAGNOSIS — R262 Difficulty in walking, not elsewhere classified: Secondary | ICD-10-CM | POA: Diagnosis not present

## 2022-02-18 DIAGNOSIS — R531 Weakness: Secondary | ICD-10-CM | POA: Diagnosis not present

## 2022-02-20 ENCOUNTER — Ambulatory Visit: Payer: Medicare Other | Admitting: Internal Medicine

## 2022-02-20 ENCOUNTER — Encounter: Payer: Self-pay | Admitting: Internal Medicine

## 2022-02-20 DIAGNOSIS — J9611 Chronic respiratory failure with hypoxia: Secondary | ICD-10-CM

## 2022-02-20 DIAGNOSIS — J449 Chronic obstructive pulmonary disease, unspecified: Secondary | ICD-10-CM

## 2022-02-20 DIAGNOSIS — F17210 Nicotine dependence, cigarettes, uncomplicated: Secondary | ICD-10-CM

## 2022-02-20 MED ORDER — AZITHROMYCIN 250 MG PO TABS: ORAL_TABLET | ORAL | 0 refills | Status: DC

## 2022-02-20 MED ORDER — PREDNISONE 10 MG PO TABS: ORAL_TABLET | ORAL | 0 refills | Status: DC

## 2022-02-20 NOTE — Assessment & Plan Note (Addendum)
ualified 12/26/2021 :  desat to 87% RA slow pace x 175 ft then on 2lpm cont maint 96% still sob p 175 ft  - best fit 01/15/22  stayed above 90% on 3 Pulse walking but rec 3 -4 with higher levels of ex - 02/20/2022   Walked on 2lpm POC   x  2  lap(s) =  approx 350  ft  @ slow/rollator pace, stopped due to sob  with lowest 02 sats 99%  Adequate control on present rx, reviewed in detail with pt > no change in rx needed    Again advised: Make sure you check your oxygen saturation  AT  your highest level of activity (not after you stop)   to be sure it stays over 90% and adjust  02 flow upward to maintain this level if needed but remember to turn it back to previous settings when you stop (to conserve your supply).   F/u q 3 m, sooner prn   Each maintenance medication was reviewed in detail including emphasizing most importantly the difference between maintenance and prns and under what circumstances the prns are to be triggered using an action plan format where appropriate.  Total time for H and P, chart review, counseling, reviewing hfa/dpi/neb/02 device(s) , directly observing portions of ambulatory 02 saturation study/ and generating customized AVS unique to this office visit / same day charting = 25 min

## 2022-02-20 NOTE — Assessment & Plan Note (Signed)
Active smoker - 12/26/2021  After extensive coaching inhaler device,  effectiveness =    75% with DPI > try trelegy if doesn't affect bladder function    Group D (now reclassified as E) in terms of symptom/risk and laba/lama/ICS  therefore appropriate rx at this point >>>  trelegy plus approp saba  Re SABA :  I spent extra time with pt today reviewing appropriate use of albuterol for prn use on exertion with the following points: 1) saba is for relief of sob that does not improve by walking a slower pace or resting but rather if the pt does not improve after trying this first. 2) If the pt is convinced, as many are, that saba helps recover from activity faster then it's easy to tell if this is the case by re-challenging : ie stop, take the inhaler, then p 5 minutes try the exact same activity (intensity of workload) that just caused the symptoms and see if they are substantially diminished or not after saba 3) if there is an activity that reproducibly causes the symptoms, try the saba 15 min before the activity on alternate days   If in fact the saba really does help, then fine to continue to use it prn but advised may need to look closer at the maintenance regimen being used to achieve better control of airways disease with exertion.  For acute flare pred x 6 d/ zpak and f/u in 3 m, sooner prn

## 2022-02-20 NOTE — Patient Instructions (Addendum)
Zpak  Prednisone 10 mg take  4 each am x 2 days,   2 each am x 2 days,  1 each am x 2 days and stop   Refer to previous instructions = ABC plan  The key is to stop smoking completely before smoking completely stops you!      Please schedule a follow up visit in 3 months but call sooner if needed

## 2022-02-20 NOTE — Progress Notes (Signed)
Mark Stevenson, male    DOB: 03/18/1956   MRN: [id number]   Brief patient profile:  8  yow  active smoker  referred to pulmonary clinic in [facility]  12/26/2021 by Dr Donnie Coffin for copd eval   Says out of rest home since  late fall of 2022 p prolonged admit [facility] (don't see this in care everywhere)    History of Present Illness  12/26/2021  Pulmonary/ 1st office eval/ Mark Stevenson / [employer] / still smoking maint on [employer] Complaint  Patient presents with   pulmonary consult    Hx of COPD. C/o sob with exertion.   Dyspnea:   PT with 02 drops and no 02 supplement  Cough: rattle > milky Sleep: on side / bed is flat  SABA use:  helps some Rec Plan A = Automatic = Always=    Trelegy 100 one each am  instead of [place] - change back if don't like trelegy for any reason Work on inhaler technique:    Plan B = Backup (to supplement plan A, not to replace it) Only use your albuterol inhaler as a rescue medication Plan C = Crisis (instead of Plan B but only if Plan B stops working) - only use your albuterol nebulizer if you first try [place] to try albuterol 15 min before an activity (on alternating days)  that you know would usually make you short of breath    Prednisone 10 mg take  4 each am x 2 days,   2 each am x 2 days,  1 each am x 2 days and stop   The key is to stop smoking completely before smoking completely stops you! Please schedule a follow up office visit in 6 weeks, call sooner if needed with all medications /inhalers/ solutions in hand   02/20/2022  f/u ov/Mark Stevenson/ [facility] re: copd  maint on trelegy  / still smoking Chief Complaint  Patient presents with   Follow-up    Patient feels like is breathing is not any better since last visit. States some days are good some are bad. Patient wears 2 liters oxygen all the time.    Dyspnea:  doing more but not checking  Cough: rattle esp in am slot yellow  Sleeping: flat bed on side  SABA use: saba 3-4  (up  from baseline) 02: 2lpm  Covid status:   x 2    No obvious patterns in day to day or daytime variability or assoc  mucus plugs or hemoptysis or cp or chest tightness, subjective wheeze or overt sinus or hb symptoms.   Sleeping  without nocturnal   exacerbation  of respiratory  c/o's or need for noct saba. Also denies any obvious fluctuation of symptoms with weather or environmental changes or other aggravating or alleviating factors except as outlined above   No unusual exposure hx or h/o childhood pna/ asthma or knowledge of premature birth.  Current Allergies, Complete Past Medical History, Past Surgical History, Family History, and Social History were reviewed in [employer] record.  ROS  The following are not active complaints unless bolded Hoarseness, sore throat, dysphagia, dental problems, itching, sneezing,  nasal congestion or discharge of excess mucus or purulent secretions, ear ache,   fever, chills, sweats, unintended wt loss or wt gain, classically pleuritic or exertional cp,  orthopnea pnd or arm/hand swelling  or leg swelling, presyncope, palpitations, abdominal pain, anorexia, nausea, vomiting, diarrhea  or change in  bowel habits or change in bladder habits, change in stools or change in urine, dysuria, hematuria,  rash, arthralgias, visual complaints, headache, numbness, weakness or ataxia or problems with walking/uses rollator or coordination,  change in mood or  memory.        Current Meds  Medication Sig   albuterol (PROVENTIL) (2.5 MG/3ML) 0.083% nebulizer solution Take 3 mLs (2.5 mg total) by nebulization every 4 (four) hours as needed for wheezing or shortness of breath.   Calcium Carb-Cholecalciferol (CALCIUM CARBONATE-VITAMIN D3 PO) Take 50 mcg by mouth daily.   Fluticasone-Umeclidin-Vilant (TRELEGY ELLIPTA) 100-62.5-25 MCG/ACT AEPB One click each am   gabapentin (NEURONTIN) 600 MG tablet Take 600 mg by mouth at bedtime.     HYDROcodone-Acetaminophen 7.5-300 MG TABS Patient takes as needed   loratadine (CLARITIN) 10 MG tablet Take 10 mg by mouth daily as needed for allergies.   losartan (COZAAR) 100 MG tablet Take 100 mg by mouth daily.   lovastatin (MEVACOR) 20 MG tablet Take 20 mg by mouth daily.    melatonin 5 MG TABS Take 5 mg by mouth at bedtime.   naproxen sodium (ALEVE) 220 MG tablet Take 220 mg by mouth daily as needed.   PROAIR HFA 108 (90 Base) MCG/ACT inhaler Inhale 2 puffs into the lungs every 6 (six) hours as needed for shortness of breath or wheezing.   triamcinolone cream (KENALOG) 0.1 % Apply 1 application topically daily as needed for rash.                     Past Medical History:  Diagnosis Date   Arthritis    Cancer ([place])    skin cancer bil knees   COPD (chronic obstructive pulmonary disease) ([facility])    Dyspnea    increased exertion; pt states can walk a flight of stairs w/o having to stop to catch breath    Hypertension    Nicotine dependence, cigarettes, uncomplicated    Peripheral neuropathy    Pure hypercholesterolemia    Sebaceous cyst    scrotal area    Viral wart        Objective:     Wt Readings from Last 3 Encounters:  02/20/22 141 lb (64 kg)  01/10/22 170 lb (77.1 kg)  03/11/21 170 lb (77.1 kg)      Vital signs reviewed  02/20/2022  - Note at rest 02 sats  100% on 2lpm    General appearance:    disheveled elderly wm > stated age / using rollator      HEENT :  Oropharynx  clear  Nasal turbintes nl    NECK :  without JVD/Nodes/TM/ nl carotid upstrokes bilaterally   LUNGS: no acc muscle use,  Mod barrel  contour chest wall with bilateral  Distant bs s audible wheeze and  without cough on insp or exp maneuvers and mod  Hyperresonant  to  percussion bilaterally     CV:  RRR  no s3 or murmur or increase in P2, and no edema   ABD:  soft and nontender with pos mid insp Hoover's  in the supine position. No bruits or organomegaly appreciated, bowel sounds  nl  MS:   Ext warm without deformities or   obvious joint restrictions , calf tenderness, cyanosis or clubbing  SKIN: warm and dry without lesions    NEURO:  alert, approp, nl sensorium with  no motor or cerebellar deficits apparent.  Assessment

## 2022-02-20 NOTE — Assessment & Plan Note (Addendum)
4-5 min discussion re active cigarette smoking in addition to office E&M  Ask about tobacco use:   ongoing Advise quitting  I emphasized that although we never turn away smokers from the pulmonary clinic, we do ask that they understand that the recommendations that we make  won't work nearly as well in the presence of continued cigarette exposure. In fact, we may very well  reach a point where we can't promise to help the patient if he/she can't quit smoking. (We can and will promise to try to help, we just can't promise what we recommend will really work)  Used analogy of trelegy like high octane fuel/ cigarettes like dirt in the carburetor Assess willingness:  Not committed at this point Assist in quit attempt:  Per PCP when ready Arrange follow up:   Follow up per Primary Care planned

## 2022-02-25 ENCOUNTER — Telehealth: Payer: Self-pay | Admitting: Internal Medicine

## 2022-02-25 DIAGNOSIS — R531 Weakness: Secondary | ICD-10-CM | POA: Diagnosis not present

## 2022-02-25 DIAGNOSIS — R262 Difficulty in walking, not elsewhere classified: Secondary | ICD-10-CM | POA: Diagnosis not present

## 2022-02-25 NOTE — Telephone Encounter (Signed)
Per AVS-Dr.Wert wanted to see patient back in 86mo  Patient is aware to contact our office if he develops in new or worsening sx in the meantime.  He voiced his understanding and had no further questions.  Nothing further needed.

## 2022-02-26 DIAGNOSIS — R338 Other retention of urine: Secondary | ICD-10-CM | POA: Diagnosis not present

## 2022-02-26 DIAGNOSIS — J449 Chronic obstructive pulmonary disease, unspecified: Secondary | ICD-10-CM | POA: Diagnosis not present

## 2022-02-26 DIAGNOSIS — E876 Hypokalemia: Secondary | ICD-10-CM | POA: Diagnosis not present

## 2022-02-26 DIAGNOSIS — E871 Hypo-osmolality and hyponatremia: Secondary | ICD-10-CM | POA: Diagnosis not present

## 2022-02-26 DIAGNOSIS — I10 Essential (primary) hypertension: Secondary | ICD-10-CM | POA: Diagnosis not present

## 2022-02-27 ENCOUNTER — Telehealth: Payer: Self-pay | Admitting: Internal Medicine

## 2022-02-27 DIAGNOSIS — R262 Difficulty in walking, not elsewhere classified: Secondary | ICD-10-CM | POA: Diagnosis not present

## 2022-02-27 DIAGNOSIS — R531 Weakness: Secondary | ICD-10-CM | POA: Diagnosis not present

## 2022-02-27 DIAGNOSIS — C4442 Squamous cell carcinoma of skin of scalp and neck: Secondary | ICD-10-CM | POA: Diagnosis not present

## 2022-02-27 DIAGNOSIS — D044 Carcinoma in situ of skin of scalp and neck: Secondary | ICD-10-CM | POA: Diagnosis not present

## 2022-02-27 MED ORDER — AZITHROMYCIN 250 MG PO TABS: ORAL_TABLET | ORAL | 0 refills | Status: AC

## 2022-02-27 NOTE — Telephone Encounter (Signed)
Spoke to patient and relayed below recommendations.  Zpak sent to preferred pharmacy.  Appt scheduled 03/07/2022 at 11:30. Nothing further needed.

## 2022-02-27 NOTE — Telephone Encounter (Signed)
Prednisone and zpak prescribed 02/20/2022.  Spoke to patient. He stated that he has mild improvement in sx. C/o prod cough with yellow to brown sputum and wheezing. SOB is baseline.  Denied f/c/s or additional sx.   Wears 2-2.5L cont. He does not monitor spo2.  Using albuterol HFA TID and trelegy once daily.  Dr. Melvyn Novas, please advise. Thanks

## 2022-02-27 NOTE — Telephone Encounter (Signed)
Zpak and f/u June 9th at [facility], ok to add on, bring all meds

## 2022-03-03 DIAGNOSIS — R262 Difficulty in walking, not elsewhere classified: Secondary | ICD-10-CM | POA: Diagnosis not present

## 2022-03-03 DIAGNOSIS — R531 Weakness: Secondary | ICD-10-CM | POA: Diagnosis not present

## 2022-03-07 ENCOUNTER — Ambulatory Visit: Payer: Medicare Other | Admitting: Internal Medicine

## 2022-03-07 ENCOUNTER — Encounter: Payer: Self-pay | Admitting: Internal Medicine

## 2022-03-07 DIAGNOSIS — F17210 Nicotine dependence, cigarettes, uncomplicated: Secondary | ICD-10-CM | POA: Diagnosis not present

## 2022-03-07 DIAGNOSIS — J9611 Chronic respiratory failure with hypoxia: Secondary | ICD-10-CM | POA: Diagnosis not present

## 2022-03-07 DIAGNOSIS — J449 Chronic obstructive pulmonary disease, unspecified: Secondary | ICD-10-CM

## 2022-03-07 MED ORDER — PREDNISONE 10 MG PO TABS: ORAL_TABLET | ORAL | 0 refills | Status: DC

## 2022-03-07 MED ORDER — BREZTRI AEROSPHERE 160-9-4.8 MCG/ACT IN AERO: 2.0000 | INHALATION_SPRAY | Freq: Two times a day (BID) | RESPIRATORY_TRACT | 11 refills | Status: DC

## 2022-03-07 NOTE — Assessment & Plan Note (Signed)
Qualified 12/26/2021 :  desat to 87% RA slow pace x 175 ft then on 2lpm cont maint 96% still sob p 175 ft  - best fit 01/15/22  stayed above 90% on 3 Pulse walking but rec 3 -4 with higher levels of ex - 02/20/2022   Walked on 2lpm POC   x  2  lap(s) =  approx 350  ft  @ slow/rollator pace, stopped due to sob  with lowest 02 sats 99%  Again advised: Make sure you check your oxygen saturation  AT  your highest level of activity (not after you stop)   to be sure it stays over 90% and adjust  02 flow upward to maintain this level if needed but remember to turn it back to previous settings when you stop (to conserve your supply).           Each maintenance medication was reviewed in detail including emphasizing most importantly the difference between maintenance and prns and under what circumstances the prns are to be triggered using an action plan format where appropriate.  Total time for H and P, chart review, counseling, reviewing hfa/neb/02 device(s) and generating customized AVS unique to this office visit / same day charting > 30 min for   refractory respiratory  Symptoms from copd in active smoker

## 2022-03-07 NOTE — Progress Notes (Signed)
Mark Stevenson, male    DOB: 1956/08/20   MRN: [id number]   Brief patient profile:  48  yow  active smoker  referred to pulmonary clinic in [facility]  12/26/2021 by Dr Donnie Coffin for copd eval   Says out of rest home since  late fall of 2022 p prolonged admit [facility] (don't see this in care everywhere)    History of Present Illness  12/26/2021  Pulmonary/ 1st office eval/ Mark Stevenson / [employer] / still smoking maint on [employer] Complaint  Patient presents with   pulmonary consult    Hx of COPD. C/o sob with exertion.   Dyspnea:   PT with 02 drops and no 02 supplement  Cough: rattle > milky Sleep: on side / bed is flat  SABA use:  helps some Rec Plan A = Automatic = Always=    Trelegy 100 one each am  instead of [place] - change back if don't like trelegy for any reason Work on inhaler technique:    Plan B = Backup (to supplement plan A, not to replace it) Only use your albuterol inhaler as a rescue medication Plan C = Crisis (instead of Plan B but only if Plan B stops working) - only use your albuterol nebulizer if you first try [place] to try albuterol 15 min before an activity (on alternating days)  that you know would usually make you short of breath    Prednisone 10 mg take  4 each am x 2 days,   2 each am x 2 days,  1 each am x 2 days and stop   The key is to stop smoking completely before smoking completely stops you! Please schedule a follow up office visit in 6 weeks, call sooner if needed with all medications /inhalers/ solutions in hand   02/20/2022  f/u ov/Mark Stevenson/ [facility] re: copd  maint on trelegy  / still smoking Chief Complaint  Patient presents with   Follow-up    Patient feels like is breathing is not any better since last visit. States some days are good some are bad. Patient wears 2 liters oxygen all the time.    Dyspnea:  doing more but not checking  Cough: rattle esp in am slot yellow  Sleeping: flat bed on side  SABA use: saba 3-4  (up  from baseline) 02: 2lpm  Covid status: x 2   Rec Zpak Prednisone 10 mg take  4 each am x 2 days,   2 each am x 2 days,  1 each am x 2 days and stop  Refer to previous instructions = ABC plan The key is to stop smoking completely before smoking completely stops you!    03/07/2022  f/u ov/Mark Stevenson/ [facility] re: GOLD  group D   maint on trelegy  / still actively smoking  Chief Complaint  Patient presents with   Follow-up    Completed recent course of zpak. C/o sob with exertion, prod cough with yellow to light brown sputum and wheezing.    Dyspnea:  does PT 2 x per week walking around lap 2lpm and newstep up to 3lpm never higher, not measuring  Cough: smoker's rattle / worse in am/ no purulent sputum Sleeping: flat bed on side  SABA use: 3 -4 x per day, never prechallenges / rare neb  02: 2-3.5 does not titrate" hands too cold )     No obvious day to day or daytime variability or assoc  mucus plugs or hemoptysis or cp or chest tightness, subjective wheeze or overt sinus or hb symptoms.   Sleeping as above  without nocturnal  or early am exacerbation  of respiratory  c/o's or need for noct saba. Also denies any obvious fluctuation of symptoms with weather or environmental changes or other aggravating or alleviating factors except as outlined above   No unusual exposure hx or h/o childhood pna/ asthma or knowledge of premature birth.  Current Allergies, Complete Past Medical History, Past Surgical History, Family History, and Social History were reviewed in [employer] record.  ROS  The following are not active complaints unless bolded Hoarseness, sore throat, dysphagia, dental problems, itching, sneezing,  nasal congestion or discharge of excess mucus or purulent secretions, ear ache,   fever, chills, sweats, unintended wt loss or wt gain, classically pleuritic or exertional cp,  orthopnea pnd or arm/hand swelling  or leg swelling, presyncope, palpitations,  abdominal pain, anorexia, nausea, vomiting, diarrhea  or change in bowel habits or change in bladder habits, change in stools or change in urine, dysuria, hematuria,  rash, arthralgias, visual complaints, headache, numbness, weakness or ataxia or problems with walking or coordination,  change in mood or  memory.        Current Meds  Medication Sig   albuterol (PROVENTIL) (2.5 MG/3ML) 0.083% nebulizer solution Take 3 mLs (2.5 mg total) by nebulization every 4 (four) hours as needed for wheezing or shortness of breath.   Calcium Carb-Cholecalciferol (CALCIUM CARBONATE-VITAMIN D3 PO) Take 50 mcg by mouth daily.   Fluticasone-Umeclidin-Vilant (TRELEGY ELLIPTA) 100-62.5-25 MCG/ACT AEPB One click each am   gabapentin (NEURONTIN) 600 MG tablet Take 600 mg by mouth at bedtime.    HYDROcodone-Acetaminophen 7.5-300 MG TABS Patient takes as needed   loratadine (CLARITIN) 10 MG tablet Take 10 mg by mouth daily as needed for allergies.   losartan (COZAAR) 100 MG tablet Take 100 mg by mouth daily.   lovastatin (MEVACOR) 20 MG tablet Take 20 mg by mouth daily.    melatonin 5 MG TABS Take 5 mg by mouth at bedtime.   naproxen sodium (ALEVE) 220 MG tablet Take 220 mg by mouth daily as needed.   PROAIR HFA 108 (90 Base) MCG/ACT inhaler Inhale 2 puffs into the lungs every 6 (six) hours as needed for shortness of breath or wheezing.   triamcinolone cream (KENALOG) 0.1 % Apply 1 application topically daily as needed for rash.                Past Medical History:  Diagnosis Date   Arthritis    Cancer ([place])    skin cancer bil knees   COPD (chronic obstructive pulmonary disease) (HCC)    Dyspnea    increased exertion; pt states can walk a flight of stairs w/o having to stop to catch breath    Hypertension    Nicotine dependence, cigarettes, uncomplicated    Peripheral neuropathy    Pure hypercholesterolemia    Sebaceous cyst    scrotal area    Viral wart        Objective:     03/07/2022          145    02/20/22 141 lb (64 kg)  01/10/22 170 lb (77.1 kg)  03/11/21 170 lb (77.1 kg)     Vital signs reviewed  03/07/2022  - Note at rest 02 sats  91% on RA   General appearance:    w/c bound somber wm nad/ rattling cough  HEENT :  Oropharynx  clear   Nasal turbinates mod edema    NECK :  without JVD/Nodes/TM/ nl carotid upstrokes bilaterally   LUNGS: no acc muscle use,  Mod barrel  contour chest wall with bilateral  isnp/exp rhonchi  and  without cough on insp or exp maneuvers and mod  Hyperresonant  to  percussion bilaterally     CV:  RRR  no s3 or murmur or increase in P2, and no edema   ABD:  soft and nontender with pos mid insp Hoover's  in the supine position. No bruits or organomegaly appreciated, bowel sounds nl  MS:   Ext warm without deformities or   obvious joint restrictions , calf tenderness, cyanosis or clubbing  SKIN: warm and dry without lesions    NEURO:  alert, approp, nl sensorium with  no motor or cerebellar deficits apparent.                Assessment

## 2022-03-07 NOTE — Assessment & Plan Note (Signed)
Counseled re importance of smoking cessation but did not meet time criteria for separate billing   °

## 2022-03-07 NOTE — Patient Instructions (Addendum)
Plan A = Automatic = Always=    Breztri (instead of trelegy)  Take 2 puffs first thing in am and then another 2 puffs about 12 hours later.    Work on inhaler technique:  relax and gently blow all the way out then take a nice smooth full deep breath back in, triggering the inhaler at same time you start breathing in.  Hold for up to 5 seconds if you can. Blow out thru nose. Rinse and gargle with water when done.  If mouth or throat bother you at all,  try brushing teeth/gums/tongue with arm and hammer toothpaste/ make a slurry and gargle and spit out.      Plan B = Backup (to supplement plan A, not to replace it) Only use your albuterol inhaler as a rescue medication to be used if you can't catch your breath by resting or doing a relaxed purse lip breathing pattern.  - The less you use it, the better it will work when you need it. - Ok to use the inhaler up to 2 puffs  every 4 hours if you must but call for appointment if use goes up over your usual need - Don't leave home without it !!  (think of it like the spare tire for your car)   Plan C = Crisis (instead of Plan B but only if Plan B stops working) - only use your albuterol nebulizer if you first try Plan B and it fails to help > ok to use the nebulizer up to every 4 hours but if start needing it regularly call for immediate appointment  Ok to [facility] to try albuterol 15 min before an activity (on alternating days)  that you know would usually make you short of breath and see if it makes any difference and if makes none then don't take albuterol after activity unless you can't catch your breath as this means it's the resting that helps, not the albuterol.       Make sure you check your oxygen saturation  AT  your highest level of activity (not after you stop)   to be sure it stays over 90% and adjust  02 flow upward to maintain this level if needed but remember to turn it back to previous settings when you stop (to conserve your supply).     Prednisone  10  take 4 for three days 3 for three days 2 for three days 1 for three days and stop   Keep prior appointment

## 2022-03-07 NOTE — Assessment & Plan Note (Signed)
Active smoker - 12/26/2021   try trelegy if doesn't affect bladder function  - 03/07/2022  After extensive coaching inhaler device,  effectiveness =    75% with hfa > try change trelegy to breztri with pred x 12 day taper and f/u in 4 weeks

## 2022-03-11 DIAGNOSIS — R262 Difficulty in walking, not elsewhere classified: Secondary | ICD-10-CM | POA: Diagnosis not present

## 2022-03-11 DIAGNOSIS — R531 Weakness: Secondary | ICD-10-CM | POA: Diagnosis not present

## 2022-03-14 ENCOUNTER — Telehealth: Payer: Self-pay | Admitting: Internal Medicine

## 2022-03-14 NOTE — Telephone Encounter (Signed)
Spoke to patient. He stated that he read that a patient with dx COPD spo2 should be between 88-92%. He purchased a pulse ox and has been monitoring his oxygen levels. He wanted to verify that spo2 should not be below 88%. He stated that his spo2 is maintaining around 96% on 2L. He is aware that those reading are acceptable.  He voiced his understanding and had no further questions.  Nothing further needed.

## 2022-03-17 ENCOUNTER — Telehealth: Payer: Self-pay | Admitting: Internal Medicine

## 2022-03-17 DIAGNOSIS — E876 Hypokalemia: Secondary | ICD-10-CM | POA: Diagnosis not present

## 2022-03-17 DIAGNOSIS — R0602 Shortness of breath: Secondary | ICD-10-CM

## 2022-03-17 DIAGNOSIS — J449 Chronic obstructive pulmonary disease, unspecified: Secondary | ICD-10-CM | POA: Diagnosis not present

## 2022-03-17 DIAGNOSIS — R338 Other retention of urine: Secondary | ICD-10-CM | POA: Diagnosis not present

## 2022-03-17 DIAGNOSIS — I10 Essential (primary) hypertension: Secondary | ICD-10-CM | POA: Diagnosis not present

## 2022-03-17 DIAGNOSIS — R531 Weakness: Secondary | ICD-10-CM | POA: Diagnosis not present

## 2022-03-17 DIAGNOSIS — E871 Hypo-osmolality and hyponatremia: Secondary | ICD-10-CM | POA: Diagnosis not present

## 2022-03-17 DIAGNOSIS — R262 Difficulty in walking, not elsewhere classified: Secondary | ICD-10-CM | POA: Diagnosis not present

## 2022-03-17 NOTE — Telephone Encounter (Signed)
Referral placed to cardiology.  Patient is aware and voiced his understanding.  Nothing further needed.

## 2022-03-17 NOTE — Telephone Encounter (Signed)
Refer to [employer] cards

## 2022-03-17 NOTE — Telephone Encounter (Signed)
Called and spoke to patient.  He is requesting a referral to cardiology in [place] or [place] area.  He feels that he needs to be seen by cardiology due to SOB.   Dr. Melvyn Novas, please advise. Thanks

## 2022-03-24 ENCOUNTER — Encounter: Payer: Self-pay | Admitting: *Deleted

## 2022-03-25 DIAGNOSIS — R262 Difficulty in walking, not elsewhere classified: Secondary | ICD-10-CM | POA: Diagnosis not present

## 2022-03-25 DIAGNOSIS — R531 Weakness: Secondary | ICD-10-CM | POA: Diagnosis not present

## 2022-03-28 DIAGNOSIS — I10 Essential (primary) hypertension: Secondary | ICD-10-CM | POA: Diagnosis not present

## 2022-03-28 DIAGNOSIS — E871 Hypo-osmolality and hyponatremia: Secondary | ICD-10-CM | POA: Diagnosis not present

## 2022-03-28 DIAGNOSIS — E876 Hypokalemia: Secondary | ICD-10-CM | POA: Diagnosis not present

## 2022-03-28 DIAGNOSIS — J449 Chronic obstructive pulmonary disease, unspecified: Secondary | ICD-10-CM | POA: Diagnosis not present

## 2022-03-28 DIAGNOSIS — R338 Other retention of urine: Secondary | ICD-10-CM | POA: Diagnosis not present

## 2022-03-28 DIAGNOSIS — R339 Retention of urine, unspecified: Secondary | ICD-10-CM | POA: Diagnosis not present

## 2022-03-31 ENCOUNTER — Encounter: Payer: Self-pay | Admitting: Cardiovascular Disease

## 2022-03-31 ENCOUNTER — Ambulatory Visit: Payer: Medicare Other | Admitting: Cardiovascular Disease

## 2022-03-31 ENCOUNTER — Other Ambulatory Visit
Admission: RE | Admit: 2022-03-31 | Discharge: 2022-03-31 | Disposition: A | Discharge status: Home / Self Care | Payer: Medicare Other | Attending: Cardiovascular Disease | Admitting: Cardiovascular Disease

## 2022-03-31 VITALS — BP 120/74 | HR 78 | Ht 72.0 in | Wt 137.5 lb

## 2022-03-31 DIAGNOSIS — R072 Precordial pain: Secondary | ICD-10-CM

## 2022-03-31 DIAGNOSIS — I209 Angina pectoris, unspecified: Secondary | ICD-10-CM | POA: Diagnosis not present

## 2022-03-31 DIAGNOSIS — J449 Chronic obstructive pulmonary disease, unspecified: Secondary | ICD-10-CM | POA: Diagnosis not present

## 2022-03-31 DIAGNOSIS — F1020 Alcohol dependence, uncomplicated: Secondary | ICD-10-CM

## 2022-03-31 DIAGNOSIS — R0602 Shortness of breath: Secondary | ICD-10-CM

## 2022-03-31 DIAGNOSIS — I10 Essential (primary) hypertension: Secondary | ICD-10-CM

## 2022-03-31 LAB — BASIC METABOLIC PANEL
Anion gap: 10 (ref 5–15)
BUN: 11 mg/dL (ref 8–23)
CO2: 36 mmol/L — ABNORMAL HIGH (ref 22–32)
Calcium: 9.6 mg/dL (ref 8.9–10.3)
Chloride: 88 mmol/L — ABNORMAL LOW (ref 98–111)
Creatinine, Ser: 0.73 mg/dL (ref 0.61–1.24)
GFR, Estimated: >60 mL/min (ref >60)
Glucose, Bld: 95 mg/dL (ref 70–99)
Potassium: 4.9 mmol/L (ref 3.5–5.1)
Sodium: 134 mmol/L — ABNORMAL LOW (ref 135–145)

## 2022-03-31 MED ORDER — METOPROLOL TARTRATE 100 MG PO TABS: 100.0000 mg | ORAL_TABLET | Freq: Once | ORAL | 0 refills | Status: DC

## 2022-03-31 NOTE — Progress Notes (Signed)
Cardiology Office Note  Date:  03/31/2022   ID:  Mark Stevenson, DOB 08/01/1956, MRN [id number]  PCP:  Mark Graff.Marlou Sa, MD   Chief Complaint  Patient presents with   New Patient (Initial Visit)    Ref by Mark Stevenson for shortness of breath with little to no exertion. Medications reviewed by the patient verbally.     HPI:  [name] is a 66 year old gentleman with past medical history of COPD, active smoker 12-15 a day Spinal stenosis, prior back surgery Alcohol abuse, hospital notes June 2022 " drinks 1-1/2 gallon every week of vodka" Hypertension Urine retention, does self cath Presenting by referral from Dr. Christinia Stevenson for shortness of breath  Fall 6/22: Hospitalization records reviewed Rhabdo, found down,  long recovery Legs weak, presents in wheelchair on today's visit  Doing PT, "not getting better"  He is concerned about chronic weakness, chronic shortness of breath, unable to do anything  No recent chest CT scan available for review No echocardiogram  Reports rarely has low heart rate at times, does not last very long, typically not very symptomatic Appreciates this using his pulse oximeter  On oxygen 2.5 liters With PT: on bike increases to 3 l  EKG personally reviewed by myself on todays visit Nsr rate 78 bpm, repolarization abnormality left axis deviation  PMH:   has a past medical history of Arthritis, Cancer ([place]), COPD (chronic obstructive pulmonary disease) ([place]), Dyspnea, Hypertension, Nicotine dependence, cigarettes, uncomplicated, Peripheral neuropathy, Pure hypercholesterolemia, Sebaceous cyst, and Viral wart.  PSH:    Past Surgical History:  Procedure Laterality Date   ankles     tarsal tunnel surgery bilat   BACK SURGERY  2008 and 10/22/16   herniated disc   broken knee cap  2002   calf surgery Bilateral    lengethen the tendons   COLONOSCOPY     crushed L-4 vertebrae  1980   KNEE ARTHROSCOPY     x3 bilat/ 2 times left knee    KYPHOPLASTY N/A 03/13/2021   Procedure: KYPHOPLASTY - T11 and L2;  Surgeon: Hessie Knows, MD;  Location: [facility] ORS;  Service: Orthopedics;  Laterality: N/A;   LEG SKIN LESION  BIOPSY / EXCISION     Bil knees   LUMBAR LAMINECTOMY/DECOMPRESSION MICRODISCECTOMY N/A 10/22/2016   Procedure: MICRO LUMBAR DECOMPRESSION L3-L4, L4-L5 REVISION  2 LEVELS;  Surgeon: Susa Day, MD;  Location: [facility];  Service: Orthopedics;  Laterality: N/A;  Requests 150 mins   PERIPHERAL VASCULAR CATHETERIZATION N/A 04/22/2016   Procedure: Abdominal Aortogram w/ bilateral Lower Extremity Runoff;  Surgeon: Serafina Mitchell, MD;  Location: [facility];  Service: Cardiovascular;  Laterality: N/A;   PERIPHERAL VASCULAR CATHETERIZATION Left 04/22/2016   Procedure: Peripheral Vascular Atherectomy;  Surgeon: Serafina Mitchell, MD;  Location: [place] [facility];  Service: Cardiovascular;  Laterality: Left;   POLYPECTOMY     ruptured disc  2008   SHOULDER OPEN ROTATOR CUFF REPAIR Right 03/19/2017   Procedure: Mini open rotator cuff repair;  Surgeon: Susa Day, MD;  Location: [facility];  Service: Orthopedics;  Laterality: Right;  with block   SHOULDER SURGERY Right 2019   tendons in calves lengthened     plantar fascitis   TOTAL HIP ARTHROPLASTY  2012   left    Current Outpatient Medications  Medication Sig Dispense Refill   albuterol (PROVENTIL) (2.5 MG/3ML) 0.083% nebulizer solution Take 3 mLs (2.5 mg total) by nebulization every 4 (four) hours as needed for wheezing or shortness  of breath. 75 mL 12   Budeson-Glycopyrrol-Formoterol (BREZTRI AEROSPHERE) 160-9-4.8 MCG/ACT AERO Inhale 2 puffs into the lungs 2 (two) times daily. Take 2 puffs first thing in am and then another 2 puffs about 12 hours later. 10.7 g 11   Cholecalciferol (VITAMIN D3) 50 MCG (2000 UT) CHEW 1 tablet     gabapentin (NEURONTIN) 600 MG tablet Take 600 mg by mouth 3 (three) times daily.     loratadine (CLARITIN) 10 MG tablet Take 10 mg by mouth  daily as needed for allergies.     losartan (COZAAR) 100 MG tablet Take 100 mg by mouth daily.     lovastatin (MEVACOR) 20 MG tablet Take 20 mg by mouth daily.      melatonin 5 MG TABS Take 5 mg by mouth at bedtime.     naproxen sodium (ALEVE) 220 MG tablet Take 220 mg by mouth daily as needed.     PROAIR HFA 108 (90 Base) MCG/ACT inhaler Inhale 2 puffs into the lungs every 6 (six) hours as needed for shortness of breath or wheezing.     triamcinolone cream (KENALOG) 0.1 % Apply 1 application topically daily as needed for rash.     triamterene-hydrochlorothiazide (MAXZIDE-25) 37.5-25 MG tablet TAKE 1 TABLET BY MOUTH EVERY DAY IN THE MORNING     Calcium Carb-Cholecalciferol (CALCIUM CARBONATE-VITAMIN D3 PO) Take 50 mcg by mouth daily. (Patient not taking: Reported on 03/31/2022)     HYDROcodone-Acetaminophen 7.5-300 MG TABS Patient takes as needed (Patient not taking: Reported on 03/31/2022)     predniSONE (DELTASONE) 10 MG tablet Take 4 for three days 3 for three days 2 for three days 1 for three days and stop (Patient not taking: Reported on 03/31/2022) 30 tablet 0   No current facility-administered medications for this visit.     Allergies:   Amoxicillin-pot clavulanate   Social History:  The patient  reports that he has been smoking cigarettes. He has a 63.00 pack-year smoking history. He has never used smokeless tobacco. He reports current alcohol use of about 10.0 standard drinks of alcohol per week. He reports that he does not use drugs.   Family History:   family history includes Colon cancer in his son; Liver cancer in his son; Lung cancer in his father.    Review of Systems: Review of Systems  Constitutional: Negative.   HENT: Negative.    Respiratory: Negative.    Cardiovascular: Negative.   Gastrointestinal: Negative.   Musculoskeletal: Negative.   Neurological: Negative.   Psychiatric/Behavioral: Negative.    All other systems reviewed and are negative.   PHYSICAL EXAM: VS:   BP 120/74 (BP Location: Right Arm)   Ht 6' (1.829 m)   Wt 137 lb 8 oz (62.4 kg)   BMI 18.65 kg/m  , BMI Body mass index is 18.65 kg/m. GEN: Well nourished, well developed, in no acute distress HEENT: normal Neck: no JVD, carotid bruits, or masses Cardiac: RRR; no murmurs, rubs, or gallops,no edema  Respiratory: Decreased breath sounds throughout, scattered rhonchi/rales GI: soft, nontender, nondistended, + BS MS: no deformity , + atrophy Skin: warm and dry, no rash Neuro:  Strength and sensation are intact Psych: euthymic mood, full affect  Recent Labs: No results found for requested labs within last 365 days.    Lipid Panel No results found for: "CHOL", "HDL", "LDLCALC", "TRIG"    Wt Readings from Last 3 Encounters:  03/31/22 137 lb 8 oz (62.4 kg)  03/07/22 145 lb (65.8 kg)  02/20/22  141 lb (64 kg)     ASSESSMENT AND PLAN:  Problem List Items Addressed This Visit       Cardiology Problems   Hypertension   Relevant Medications   triamterene-hydrochlorothiazide (MAXZIDE-25) 37.5-25 MG tablet     Other   SOB (shortness of breath)   COPD clinically severe/ still smoking   Alcohol dependence ([place])   Other Visit Diagnoses     Angina pectoris (HCC)    -  Primary   Relevant Medications   triamterene-hydrochlorothiazide (MAXZIDE-25) 37.5-25 MG tablet      Shortness of breath, tightness Unable to exclude ongoing angina Long history of smoking Best option for ischemic work-up would be cardiac CTA The above was discussed with him and he is in agreement Also ordered echocardiogram  Smoker/COPD Followed by pulmonary We have encouraged him to continue to work on weaning his cigarettes and smoking cessation. He will continue to work on this and does not want any assistance with chantix.    Alcohol abuse Noted on prior notes and hospital admissions Cessation recommended Prior history of rhabdo, found down Has leg weakness, muscle atrophy  Essential  hypertension Blood pressure is well controlled on today's visit. No changes made to the medications.    Total encounter time more than 60 minutes  Greater than 50% was spent in counseling and coordination of care with the patient    Signed, Esmond Plants, M.D., Ph.D. [place], [place]

## 2022-03-31 NOTE — Patient Instructions (Addendum)
Medication Instructions:  No changes  If you need a refill on your cardiac medications before your next appointment, please call your pharmacy.   Lab work:  BMET today at the medical mall at [facility]:  -  Please go to the [place] Entrance at [place] in at the Registration Desk: 1st desk to the right, past the screening table   Testing/Procedures:  1) Your cardiac CT is scheduled Thursday 04/03/22 at 12:30 PM at the below location:   [facility] [place], [place] [address]  Check in at the [place] at the Registration desk  Please arrive 15 mins early for check-in and test prep.  Please follow these instructions carefully (unless otherwise directed):  Hold all erectile dysfunction medications at least 3 days (72 hrs) prior to test.  On the Night Before the Test:  Be sure to Drink plenty of water. Do not consume any caffeinated/decaffeinated beverages or chocolate 12 hours prior to your test. Do not take any antihistamines 12 hours prior to your test.   On the Day of the Test:  Drink plenty of water until 1 hour prior to the test. Do not eat any food 4 hours prior to the test. You may take your regular medications prior to the test.  Take metoprolol (Lopressor) two hours prior to test. HOLD Furosemide/Hydrochlorothiazide morning of the test.       After the Test: Drink plenty of water. After receiving IV contrast, you may experience a mild flushed feeling. This is normal. On occasion, you may experience a mild rash up to 24 hours after the test. This is not dangerous. If this occurs, you can take Benadryl 25 mg and increase your fluid intake. If you experience trouble breathing, this can be serious. If it is severe call 911 IMMEDIATELY. If it is mild, please call our office. If you take any of these medications: Glipizide/Metformin, Avandament, Glucavance, please do not take 48 hours after completing test unless  otherwise instructed.  We will call to schedule your test 2-4 weeks out understanding that some insurance companies will need an authorization prior to the service being performed.   For non-scheduling related questions, please contact the cardiac imaging nurse navigator should you have any questions/concerns: Marchia Bond, Cardiac Imaging Nurse Navigator Gordy Clement, Cardiac Imaging Nurse Navigator [facility] Direct Office Dial: [phone]   For scheduling needs, including cancellations and rescheduling, please call [place], [phone].  2) Your physician has requested that you have an echocardiogram. Echocardiography is a painless test that uses sound waves to create images of your heart. It provides your doctor with information about the size and shape of your heart and how well your heart's chambers and valves are working. This procedure takes approximately one hour. There are no restrictions for this procedure.   Follow-Up: At [facility], you and your health needs are our priority.  As part of our continuing mission to provide you with exceptional heart care, we have created designated Provider Care Teams.  These Care Teams include your primary Cardiologist (physician) and Advanced Practice Providers (APPs -  Physician Assistants and Nurse Practitioners) who all work together to provide you with the care you need, when you need it.  You will need a follow up appointment as needed  Providers on your designated Care Team:   Murray Hodgkins, NP Christell Faith, PA-C Cadence Kathlen Mody, [place]  COVID-19 Vaccine Information can be found at: ShippingScam.co.uk For questions  related to vaccine distribution or appointments, please email [email or web address] or call [phone].

## 2022-04-02 ENCOUNTER — Telehealth (HOSPITAL_COMMUNITY): Payer: Self-pay | Admitting: *Deleted

## 2022-04-02 NOTE — Telephone Encounter (Signed)
Reaching out to patient to offer assistance regarding upcoming cardiac imaging study; pt verbalizes understanding of appt date/time, parking situation and where to check in, pre-test NPO status and medications ordered, and verified current allergies; name and call back number provided for further questions should they arise  Gordy Clement RN Navigator Cardiac [place] [facility] [phone] office [phone] cell  Patient to take '100mg'$  metoprolol tartrate two hours prior to his cardiac scan.

## 2022-04-03 ENCOUNTER — Encounter: Payer: Self-pay | Admitting: Emergency Medicine

## 2022-04-03 ENCOUNTER — Ambulatory Visit
Admission: RE | Admit: 2022-04-03 | Discharge: 2022-04-03 | Disposition: A | Discharge status: Home / Self Care | Payer: Medicare Other | Source: Ambulatory Visit | Attending: Cardiovascular Disease | Admitting: Cardiovascular Disease

## 2022-04-03 DIAGNOSIS — R072 Precordial pain: Secondary | ICD-10-CM | POA: Diagnosis not present

## 2022-04-03 MED ORDER — METOPROLOL TARTRATE 5 MG/5ML IV SOLN
5.0000 mg | Freq: Once | INTRAVENOUS | Status: AC
  Administered 2022-04-03: 5 mg via INTRAVENOUS

## 2022-04-03 MED ORDER — NITROGLYCERIN 0.4 MG SL SUBL
SUBLINGUAL_TABLET | SUBLINGUAL | Status: AC
  Administered 2022-04-03: 0.8 mg via SUBLINGUAL
  Filled 2022-04-03: qty 2

## 2022-04-03 MED ORDER — METOPROLOL TARTRATE 5 MG/5ML IV SOLN
INTRAVENOUS | Status: AC
  Filled 2022-04-03: qty 5

## 2022-04-03 MED ORDER — IOHEXOL 350 MG/ML SOLN
75.0000 mL | Freq: Once | INTRAVENOUS | Status: AC | PRN
  Administered 2022-04-03: 75 mL via INTRAVENOUS

## 2022-04-03 MED ORDER — METOPROLOL TARTRATE 5 MG/5ML IV SOLN
INTRAVENOUS | Status: AC
  Filled 2022-04-03: qty 10

## 2022-04-03 MED ORDER — NITROGLYCERIN 0.4 MG SL SUBL
0.8000 mg | SUBLINGUAL_TABLET | Freq: Once | SUBLINGUAL | Status: AC
  Filled 2022-04-03: qty 2

## 2022-04-03 NOTE — Progress Notes (Signed)
Patient tolerated CT well.  Vital signs stable encourage to drink water throughout day.Reasons explained and verbalized understanding.   

## 2022-04-03 NOTE — Progress Notes (Signed)
Results letter for patient

## 2022-04-07 ENCOUNTER — Telehealth: Payer: Self-pay | Admitting: Cardiovascular Disease

## 2022-04-07 ENCOUNTER — Emergency Department
Admission: EM | Admit: 2022-04-07 | Discharge: 2022-04-07 | Discharge status: Against Medical Advice | Payer: Medicare Other | Source: Home / Self Care

## 2022-04-07 ENCOUNTER — Other Ambulatory Visit: Payer: Self-pay

## 2022-04-07 DIAGNOSIS — R0602 Shortness of breath: Secondary | ICD-10-CM | POA: Diagnosis not present

## 2022-04-07 DIAGNOSIS — R441 Visual hallucinations: Secondary | ICD-10-CM | POA: Insufficient documentation

## 2022-04-07 DIAGNOSIS — I10 Essential (primary) hypertension: Secondary | ICD-10-CM | POA: Diagnosis not present

## 2022-04-07 DIAGNOSIS — Z9181 History of falling: Secondary | ICD-10-CM | POA: Diagnosis not present

## 2022-04-07 DIAGNOSIS — R627 Adult failure to thrive: Secondary | ICD-10-CM | POA: Diagnosis not present

## 2022-04-07 DIAGNOSIS — Z20822 Contact with and (suspected) exposure to covid-19: Secondary | ICD-10-CM | POA: Diagnosis not present

## 2022-04-07 DIAGNOSIS — J439 Emphysema, unspecified: Secondary | ICD-10-CM | POA: Diagnosis not present

## 2022-04-07 DIAGNOSIS — Z85828 Personal history of other malignant neoplasm of skin: Secondary | ICD-10-CM | POA: Diagnosis not present

## 2022-04-07 DIAGNOSIS — T502X5A Adverse effect of carbonic-anhydrase inhibitors, benzothiadiazides and other diuretics, initial encounter: Secondary | ICD-10-CM | POA: Diagnosis not present

## 2022-04-07 DIAGNOSIS — F17210 Nicotine dependence, cigarettes, uncomplicated: Secondary | ICD-10-CM | POA: Diagnosis not present

## 2022-04-07 DIAGNOSIS — Z5321 Procedure and treatment not carried out due to patient leaving prior to being seen by health care provider: Secondary | ICD-10-CM | POA: Insufficient documentation

## 2022-04-07 DIAGNOSIS — I499 Cardiac arrhythmia, unspecified: Secondary | ICD-10-CM | POA: Diagnosis not present

## 2022-04-07 DIAGNOSIS — Z681 Body mass index (BMI) 19 or less, adult: Secondary | ICD-10-CM | POA: Diagnosis not present

## 2022-04-07 DIAGNOSIS — G9341 Metabolic encephalopathy: Secondary | ICD-10-CM | POA: Diagnosis not present

## 2022-04-07 DIAGNOSIS — E43 Unspecified severe protein-calorie malnutrition: Secondary | ICD-10-CM | POA: Diagnosis not present

## 2022-04-07 DIAGNOSIS — J9621 Acute and chronic respiratory failure with hypoxia: Secondary | ICD-10-CM | POA: Diagnosis not present

## 2022-04-07 DIAGNOSIS — Z801 Family history of malignant neoplasm of trachea, bronchus and lung: Secondary | ICD-10-CM | POA: Diagnosis not present

## 2022-04-07 DIAGNOSIS — Z515 Encounter for palliative care: Secondary | ICD-10-CM | POA: Diagnosis not present

## 2022-04-07 DIAGNOSIS — E8729 Other acidosis: Secondary | ICD-10-CM | POA: Diagnosis not present

## 2022-04-07 DIAGNOSIS — E871 Hypo-osmolality and hyponatremia: Secondary | ICD-10-CM | POA: Diagnosis not present

## 2022-04-07 DIAGNOSIS — J9622 Acute and chronic respiratory failure with hypercapnia: Secondary | ICD-10-CM | POA: Diagnosis not present

## 2022-04-07 DIAGNOSIS — Z634 Disappearance and death of family member: Secondary | ICD-10-CM | POA: Diagnosis not present

## 2022-04-07 DIAGNOSIS — R0689 Other abnormalities of breathing: Secondary | ICD-10-CM | POA: Diagnosis not present

## 2022-04-07 DIAGNOSIS — E7800 Pure hypercholesterolemia, unspecified: Secondary | ICD-10-CM | POA: Diagnosis not present

## 2022-04-07 DIAGNOSIS — E222 Syndrome of inappropriate secretion of antidiuretic hormone: Secondary | ICD-10-CM | POA: Diagnosis not present

## 2022-04-07 DIAGNOSIS — Z66 Do not resuscitate: Secondary | ICD-10-CM | POA: Diagnosis not present

## 2022-04-07 DIAGNOSIS — Z800 Family history of malignant neoplasm of digestive organs: Secondary | ICD-10-CM | POA: Diagnosis not present

## 2022-04-07 DIAGNOSIS — Z743 Need for continuous supervision: Secondary | ICD-10-CM | POA: Diagnosis not present

## 2022-04-07 DIAGNOSIS — M722 Plantar fascial fibromatosis: Secondary | ICD-10-CM | POA: Diagnosis not present

## 2022-04-07 DIAGNOSIS — J441 Chronic obstructive pulmonary disease with (acute) exacerbation: Secondary | ICD-10-CM | POA: Diagnosis not present

## 2022-04-07 LAB — COMPREHENSIVE METABOLIC PANEL
ALT: 15 U/L (ref 0–44)
AST: 25 U/L (ref 15–41)
Albumin: 3.8 g/dL (ref 3.5–5.0)
Alkaline Phosphatase: 68 U/L (ref 38–126)
Anion gap: 12 (ref 5–15)
BUN: 15 mg/dL (ref 8–23)
CO2: 28 mmol/L (ref 22–32)
Calcium: 8.6 mg/dL — ABNORMAL LOW (ref 8.9–10.3)
Chloride: 86 mmol/L — ABNORMAL LOW (ref 98–111)
Creatinine, Ser: 0.54 mg/dL — ABNORMAL LOW (ref 0.61–1.24)
GFR, Estimated: >60 mL/min (ref >60)
Glucose, Bld: 84 mg/dL (ref 70–99)
Potassium: 4.3 mmol/L (ref 3.5–5.1)
Sodium: 126 mmol/L — ABNORMAL LOW (ref 135–145)
Total Bilirubin: 1 mg/dL (ref 0.3–1.2)
Total Protein: 6.5 g/dL (ref 6.5–8.1)

## 2022-04-07 LAB — CBC
HCT: 40.6 % (ref 39.0–52.0)
Hemoglobin: 13.6 g/dL (ref 13.0–17.0)
MCH: 31 pg (ref 26.0–34.0)
MCHC: 33.5 g/dL (ref 30.0–36.0)
MCV: 92.5 fL (ref 80.0–100.0)
Platelets: 201 10*3/uL (ref 150–400)
RBC: 4.39 MIL/uL (ref 4.22–5.81)
RDW: 12.1 % (ref 11.5–15.5)
WBC: 10.9 10*3/uL — ABNORMAL HIGH (ref 4.0–10.5)
nRBC: 0 % (ref 0.0–0.2)

## 2022-04-07 LAB — TROPONIN I (HIGH SENSITIVITY): Troponin I (High Sensitivity): 9 ng/L (ref <18)

## 2022-04-07 NOTE — Telephone Encounter (Signed)
  Pt said, he might need to get a couple of stent put in and wanted to see Dr. Rockey Situ to discuss. Offered APP but first available is in august

## 2022-04-07 NOTE — Telephone Encounter (Signed)
Scheduled 7/18 Mark Stevenson .   Patient is reporting hallucinations and seeing people and things .  Patient advised to also call pcp asap to discuss .

## 2022-04-07 NOTE — Telephone Encounter (Signed)
Noted  

## 2022-04-07 NOTE — Telephone Encounter (Signed)
See previous encounter

## 2022-04-07 NOTE — ED Triage Notes (Signed)
Pt comes into the ED via EMS from home, per pt family pt was having auditory and visual hallucinations last night, pt is normally on 2L [place] at home.   2 duoneb Solu medrol '125mg'$  IV 100% on 2L [place] HR86 140/88 [id number]

## 2022-04-07 NOTE — ED Provider Triage Note (Signed)
Emergency Medicine Provider Triage Evaluation Note  Mark Stevenson , a 66 y.o. male  was evaluated in triage.  Pt complains of auditory and visual hallucinations. No med changes, Self caths. Seeing people, reports this has happened before many years ago   Review of Systems  Positive: Hallucinations, visual Negative: SI/HI, pain  Physical Exam  There were no vitals taken for this visit. Gen:   Awake, no distress   Resp:  Normal effort  MSK:   Moves extremities without difficulty  Other:  Answers questions appropriately  Medical Decision Making  Medically screening exam initiated at 4:56 PM.  Appropriate orders placed.  Mark Stevenson was informed that the remainder of the evaluation will be completed by another provider, this initial triage assessment does not replace that evaluation, and the importance of remaining in the ED until their evaluation is complete.    Marquette Old, PA-C 04/07/22 1659

## 2022-04-07 NOTE — Telephone Encounter (Signed)
ERROR

## 2022-04-07 NOTE — ED Triage Notes (Signed)
Pt to ED via [facility] from home due to AMS. Family states pt has been having auditory and visual hallucinations. Pt states he is seeing people and confetti. Pt denies any medication changes or states is med compliant. Pt wears 2L [place] chronically.

## 2022-04-08 ENCOUNTER — Inpatient Hospital Stay: Payer: Medicare Other

## 2022-04-08 ENCOUNTER — Inpatient Hospital Stay
Admission: EM | Admit: 2022-04-08 | Discharge: 2022-04-14 | DRG: 643 | Disposition: A | Discharge status: Hospice | Payer: Medicare Other | Attending: Internal Medicine | Admitting: Internal Medicine

## 2022-04-08 ENCOUNTER — Emergency Department: Payer: Medicare Other

## 2022-04-08 ENCOUNTER — Other Ambulatory Visit: Payer: Self-pay

## 2022-04-08 ENCOUNTER — Telehealth: Payer: Self-pay | Admitting: Emergency Medicine

## 2022-04-08 DIAGNOSIS — R0602 Shortness of breath: Secondary | ICD-10-CM | POA: Diagnosis not present

## 2022-04-08 DIAGNOSIS — Z9181 History of falling: Secondary | ICD-10-CM | POA: Diagnosis not present

## 2022-04-08 DIAGNOSIS — R6889 Other general symptoms and signs: Secondary | ICD-10-CM | POA: Diagnosis not present

## 2022-04-08 DIAGNOSIS — F1010 Alcohol abuse, uncomplicated: Secondary | ICD-10-CM | POA: Diagnosis present

## 2022-04-08 DIAGNOSIS — E222 Syndrome of inappropriate secretion of antidiuretic hormone: Principal | ICD-10-CM | POA: Diagnosis present

## 2022-04-08 DIAGNOSIS — I10 Essential (primary) hypertension: Secondary | ICD-10-CM | POA: Diagnosis not present

## 2022-04-08 DIAGNOSIS — R069 Unspecified abnormalities of breathing: Secondary | ICD-10-CM | POA: Diagnosis not present

## 2022-04-08 DIAGNOSIS — I25118 Atherosclerotic heart disease of native coronary artery with other forms of angina pectoris: Secondary | ICD-10-CM | POA: Diagnosis present

## 2022-04-08 DIAGNOSIS — R4182 Altered mental status, unspecified: Secondary | ICD-10-CM | POA: Diagnosis not present

## 2022-04-08 DIAGNOSIS — F419 Anxiety disorder, unspecified: Secondary | ICD-10-CM | POA: Diagnosis present

## 2022-04-08 DIAGNOSIS — J441 Chronic obstructive pulmonary disease with (acute) exacerbation: Secondary | ICD-10-CM | POA: Diagnosis not present

## 2022-04-08 DIAGNOSIS — J9621 Acute and chronic respiratory failure with hypoxia: Secondary | ICD-10-CM | POA: Diagnosis not present

## 2022-04-08 DIAGNOSIS — J9622 Acute and chronic respiratory failure with hypercapnia: Secondary | ICD-10-CM | POA: Diagnosis present

## 2022-04-08 DIAGNOSIS — Z801 Family history of malignant neoplasm of trachea, bronchus and lung: Secondary | ICD-10-CM

## 2022-04-08 DIAGNOSIS — Z96642 Presence of left artificial hip joint: Secondary | ICD-10-CM | POA: Diagnosis present

## 2022-04-08 DIAGNOSIS — G9341 Metabolic encephalopathy: Secondary | ICD-10-CM | POA: Diagnosis present

## 2022-04-08 DIAGNOSIS — J439 Emphysema, unspecified: Secondary | ICD-10-CM | POA: Diagnosis not present

## 2022-04-08 DIAGNOSIS — Z800 Family history of malignant neoplasm of digestive organs: Secondary | ICD-10-CM

## 2022-04-08 DIAGNOSIS — R627 Adult failure to thrive: Secondary | ICD-10-CM | POA: Diagnosis not present

## 2022-04-08 DIAGNOSIS — J9611 Chronic respiratory failure with hypoxia: Secondary | ICD-10-CM | POA: Diagnosis present

## 2022-04-08 DIAGNOSIS — Z634 Disappearance and death of family member: Secondary | ICD-10-CM | POA: Diagnosis not present

## 2022-04-08 DIAGNOSIS — E871 Hypo-osmolality and hyponatremia: Secondary | ICD-10-CM | POA: Diagnosis not present

## 2022-04-08 DIAGNOSIS — E43 Unspecified severe protein-calorie malnutrition: Secondary | ICD-10-CM | POA: Diagnosis present

## 2022-04-08 DIAGNOSIS — Z7401 Bed confinement status: Secondary | ICD-10-CM | POA: Diagnosis not present

## 2022-04-08 DIAGNOSIS — Z66 Do not resuscitate: Secondary | ICD-10-CM | POA: Diagnosis not present

## 2022-04-08 DIAGNOSIS — R29898 Other symptoms and signs involving the musculoskeletal system: Secondary | ICD-10-CM | POA: Diagnosis not present

## 2022-04-08 DIAGNOSIS — Z515 Encounter for palliative care: Secondary | ICD-10-CM | POA: Diagnosis not present

## 2022-04-08 DIAGNOSIS — M722 Plantar fascial fibromatosis: Secondary | ICD-10-CM | POA: Diagnosis present

## 2022-04-08 DIAGNOSIS — Z85828 Personal history of other malignant neoplasm of skin: Secondary | ICD-10-CM

## 2022-04-08 DIAGNOSIS — E8729 Other acidosis: Secondary | ICD-10-CM | POA: Diagnosis present

## 2022-04-08 DIAGNOSIS — J9620 Acute and chronic respiratory failure, unspecified whether with hypoxia or hypercapnia: Secondary | ICD-10-CM | POA: Diagnosis not present

## 2022-04-08 DIAGNOSIS — Z681 Body mass index (BMI) 19 or less, adult: Secondary | ICD-10-CM | POA: Diagnosis not present

## 2022-04-08 DIAGNOSIS — Z79899 Other long term (current) drug therapy: Secondary | ICD-10-CM

## 2022-04-08 DIAGNOSIS — L8990 Pressure ulcer of unspecified site, unspecified stage: Secondary | ICD-10-CM | POA: Insufficient documentation

## 2022-04-08 DIAGNOSIS — F17210 Nicotine dependence, cigarettes, uncomplicated: Secondary | ICD-10-CM | POA: Diagnosis present

## 2022-04-08 DIAGNOSIS — I499 Cardiac arrhythmia, unspecified: Secondary | ICD-10-CM | POA: Diagnosis not present

## 2022-04-08 DIAGNOSIS — T502X5A Adverse effect of carbonic-anhydrase inhibitors, benzothiadiazides and other diuretics, initial encounter: Secondary | ICD-10-CM | POA: Diagnosis present

## 2022-04-08 DIAGNOSIS — Z743 Need for continuous supervision: Secondary | ICD-10-CM | POA: Diagnosis not present

## 2022-04-08 DIAGNOSIS — Z20822 Contact with and (suspected) exposure to covid-19: Secondary | ICD-10-CM | POA: Diagnosis present

## 2022-04-08 DIAGNOSIS — E7800 Pure hypercholesterolemia, unspecified: Secondary | ICD-10-CM | POA: Diagnosis present

## 2022-04-08 DIAGNOSIS — R0689 Other abnormalities of breathing: Secondary | ICD-10-CM | POA: Diagnosis not present

## 2022-04-08 DIAGNOSIS — R339 Retention of urine, unspecified: Secondary | ICD-10-CM | POA: Diagnosis not present

## 2022-04-08 DIAGNOSIS — M4800 Spinal stenosis, site unspecified: Secondary | ICD-10-CM | POA: Diagnosis present

## 2022-04-08 DIAGNOSIS — R441 Visual hallucinations: Secondary | ICD-10-CM | POA: Diagnosis present

## 2022-04-08 DIAGNOSIS — G8929 Other chronic pain: Secondary | ICD-10-CM | POA: Diagnosis present

## 2022-04-08 DIAGNOSIS — Z9981 Dependence on supplemental oxygen: Secondary | ICD-10-CM

## 2022-04-08 DIAGNOSIS — Z7189 Other specified counseling: Secondary | ICD-10-CM | POA: Diagnosis not present

## 2022-04-08 DIAGNOSIS — R5381 Other malaise: Secondary | ICD-10-CM | POA: Diagnosis not present

## 2022-04-08 DIAGNOSIS — L89891 Pressure ulcer of other site, stage 1: Secondary | ICD-10-CM | POA: Diagnosis present

## 2022-04-08 DIAGNOSIS — G629 Polyneuropathy, unspecified: Secondary | ICD-10-CM | POA: Diagnosis present

## 2022-04-08 LAB — CBC
HCT: 38.5 % — ABNORMAL LOW (ref 39.0–52.0)
Hemoglobin: 13.2 g/dL (ref 13.0–17.0)
MCH: 30.8 pg (ref 26.0–34.0)
MCHC: 34.3 g/dL (ref 30.0–36.0)
MCV: 90 fL (ref 80.0–100.0)
Platelets: 227 10*3/uL (ref 150–400)
RBC: 4.28 MIL/uL (ref 4.22–5.81)
RDW: 12.1 % (ref 11.5–15.5)
WBC: 14.4 10*3/uL — ABNORMAL HIGH (ref 4.0–10.5)
nRBC: 0 % (ref 0.0–0.2)

## 2022-04-08 LAB — BASIC METABOLIC PANEL
Anion gap: 10 (ref 5–15)
BUN: 19 mg/dL (ref 8–23)
CO2: 34 mmol/L — ABNORMAL HIGH (ref 22–32)
Calcium: 9.5 mg/dL (ref 8.9–10.3)
Chloride: 77 mmol/L — ABNORMAL LOW (ref 98–111)
Creatinine, Ser: 0.73 mg/dL (ref 0.61–1.24)
GFR, Estimated: >60 mL/min (ref >60)
Glucose, Bld: 111 mg/dL — ABNORMAL HIGH (ref 70–99)
Potassium: 4.7 mmol/L (ref 3.5–5.1)
Sodium: 121 mmol/L — ABNORMAL LOW (ref 135–145)

## 2022-04-08 LAB — CBG MONITORING, ED: Glucose-Capillary: 127 mg/dL — ABNORMAL HIGH (ref 70–99)

## 2022-04-08 LAB — BLOOD GAS, ARTERIAL
Acid-Base Excess: 11.1 mmol/L — ABNORMAL HIGH (ref 0.0–2.0)
Bicarbonate: 39.3 mmol/L — ABNORMAL HIGH (ref 20.0–28.0)
O2 Content: 3.5 L/min
O2 Saturation: 95.6 %
Patient temperature: 37
pCO2 arterial: 68 mmHg (ref 32–48)
pH, Arterial: 7.37 (ref 7.35–7.45)
pO2, Arterial: 78 mmHg — ABNORMAL LOW (ref 83–108)

## 2022-04-08 LAB — SODIUM
Sodium: 124 mmol/L — ABNORMAL LOW (ref 135–145)
Sodium: 126 mmol/L — ABNORMAL LOW (ref 135–145)

## 2022-04-08 LAB — SODIUM, URINE, RANDOM: Sodium, Ur: 27 mmol/L

## 2022-04-08 LAB — HIV ANTIBODY (ROUTINE TESTING W REFLEX): HIV Screen 4th Generation wRfx: NONREACTIVE

## 2022-04-08 LAB — OSMOLALITY, URINE: Osmolality, Ur: 351 mOsm/kg (ref 300–900)

## 2022-04-08 LAB — TROPONIN I (HIGH SENSITIVITY): Troponin I (High Sensitivity): 12 ng/L (ref <18)

## 2022-04-08 MED ORDER — ALBUTEROL SULFATE (2.5 MG/3ML) 0.083% IN NEBU
2.5000 mg | INHALATION_SOLUTION | RESPIRATORY_TRACT | Status: DC | PRN
  Administered 2022-04-11: 2.5 mg via RESPIRATORY_TRACT
  Filled 2022-04-08: qty 3

## 2022-04-08 MED ORDER — ACETAMINOPHEN 325 MG PO TABS
650.0000 mg | ORAL_TABLET | Freq: Four times a day (QID) | ORAL | Status: DC | PRN
  Administered 2022-04-10 – 2022-04-12 (×3): 650 mg via ORAL
  Filled 2022-04-08 (×3): qty 2

## 2022-04-08 MED ORDER — SODIUM CHLORIDE 0.9 % IV SOLN: INTRAVENOUS | Status: DC

## 2022-04-08 MED ORDER — PREDNISONE 20 MG PO TABS
40.0000 mg | ORAL_TABLET | Freq: Every day | ORAL | Status: AC
  Administered 2022-04-10 – 2022-04-13 (×4): 40 mg via ORAL
  Filled 2022-04-08 (×4): qty 2

## 2022-04-08 MED ORDER — MELATONIN 5 MG PO TABS
5.0000 mg | ORAL_TABLET | Freq: Every day | ORAL | Status: DC
  Administered 2022-04-09 – 2022-04-13 (×5): 5 mg via ORAL
  Filled 2022-04-08 (×5): qty 1

## 2022-04-08 MED ORDER — METOPROLOL TARTRATE 50 MG PO TABS
100.0000 mg | ORAL_TABLET | Freq: Once | ORAL | Status: DC
  Filled 2022-04-08: qty 2

## 2022-04-08 MED ORDER — ENOXAPARIN SODIUM 40 MG/0.4ML IJ SOSY
40.0000 mg | PREFILLED_SYRINGE | INTRAMUSCULAR | Status: DC
  Administered 2022-04-08 – 2022-04-13 (×6): 40 mg via SUBCUTANEOUS
  Filled 2022-04-08 (×6): qty 0.4

## 2022-04-08 MED ORDER — LORAZEPAM 1 MG PO TABS
1.0000 mg | ORAL_TABLET | Freq: Once | ORAL | Status: AC
  Administered 2022-04-08: 1 mg via ORAL
  Filled 2022-04-08: qty 1

## 2022-04-08 MED ORDER — SODIUM CHLORIDE 3 % IV SOLN
INTRAVENOUS | Status: DC
  Filled 2022-04-08: qty 500

## 2022-04-08 MED ORDER — FLUTICASONE FUROATE-VILANTEROL 200-25 MCG/ACT IN AEPB
1.0000 | INHALATION_SPRAY | Freq: Every day | RESPIRATORY_TRACT | Status: DC
  Administered 2022-04-09 – 2022-04-14 (×6): 1 via RESPIRATORY_TRACT
  Filled 2022-04-08 (×3): qty 28

## 2022-04-08 MED ORDER — ONDANSETRON HCL 4 MG PO TABS
4.0000 mg | ORAL_TABLET | Freq: Four times a day (QID) | ORAL | Status: DC | PRN
  Administered 2022-04-14: 4 mg via ORAL
  Filled 2022-04-08: qty 1

## 2022-04-08 MED ORDER — SODIUM CHLORIDE 0.9 % IV BOLUS
1000.0000 mL | Freq: Once | INTRAVENOUS | Status: AC
  Administered 2022-04-08: 1000 mL via INTRAVENOUS

## 2022-04-08 MED ORDER — ACETAMINOPHEN 650 MG RE SUPP: 650.0000 mg | Freq: Four times a day (QID) | RECTAL | Status: DC | PRN

## 2022-04-08 MED ORDER — LOSARTAN POTASSIUM 50 MG PO TABS
100.0000 mg | ORAL_TABLET | Freq: Every day | ORAL | Status: DC
  Administered 2022-04-09 – 2022-04-10 (×2): 100 mg via ORAL
  Filled 2022-04-08 (×6): qty 2

## 2022-04-08 MED ORDER — METHYLPREDNISOLONE SODIUM SUCC 40 MG IJ SOLR
40.0000 mg | Freq: Two times a day (BID) | INTRAMUSCULAR | Status: AC
  Administered 2022-04-08 – 2022-04-09 (×2): 40 mg via INTRAVENOUS
  Filled 2022-04-08 (×2): qty 1

## 2022-04-08 MED ORDER — GABAPENTIN 600 MG PO TABS
600.0000 mg | ORAL_TABLET | Freq: Three times a day (TID) | ORAL | Status: DC
Start: 2022-04-08 — End: 2022-04-14
  Administered 2022-04-08 – 2022-04-14 (×18): 600 mg via ORAL
  Filled 2022-04-08 (×18): qty 1

## 2022-04-08 MED ORDER — BUDESON-GLYCOPYRROL-FORMOTEROL 160-9-4.8 MCG/ACT IN AERO: 2.0000 | INHALATION_SPRAY | Freq: Two times a day (BID) | RESPIRATORY_TRACT | Status: DC

## 2022-04-08 MED ORDER — PRAVASTATIN SODIUM 20 MG PO TABS
20.0000 mg | ORAL_TABLET | Freq: Every day | ORAL | Status: DC
  Administered 2022-04-09 – 2022-04-10 (×2): 20 mg via ORAL
  Filled 2022-04-08 (×2): qty 1

## 2022-04-08 MED ORDER — ONDANSETRON HCL 4 MG/2ML IJ SOLN
4.0000 mg | Freq: Four times a day (QID) | INTRAMUSCULAR | Status: DC | PRN
  Administered 2022-04-10: 4 mg via INTRAVENOUS
  Filled 2022-04-08: qty 2

## 2022-04-08 MED ORDER — UMECLIDINIUM BROMIDE 62.5 MCG/ACT IN AEPB
1.0000 | INHALATION_SPRAY | Freq: Every day | RESPIRATORY_TRACT | Status: DC
Start: 2022-04-09 — End: 2022-04-14
  Administered 2022-04-09 – 2022-04-14 (×6): 1 via RESPIRATORY_TRACT
  Filled 2022-04-08 (×2): qty 7

## 2022-04-08 NOTE — Assessment & Plan Note (Addendum)
Treatment as outlined in 3 

## 2022-04-08 NOTE — Consult Note (Signed)
[facility]  CONSULT NOTE    Date: 04/08/2022                  Patient Name:  Mark Stevenson  MRN: [id number]  DOB: Jul 30, 1956  Age / Sex: 66 y.o., male         PCP: Alroy Dust, L.Marlou Sa, MD                 Service Requesting Consult: [facility]                 Reason for Consult: Hyponatremia            History of Present Illness: Mark Stevenson is a 65 y.o.  male with past medical history including COPD, hypertension, skin cancer, arthritis, smoking, and peripheral neuropathy, who was admitted to [facility] on 04/08/2022 for Hyponatremia [E87.1]  Patient presents to the emergency department today with progressive shortness of breath.  Patient does have history of COPD and requires 2 to 3 L nasal cannula at home.  Reports a productive cough.  Patient also states he has been having visual hallucinations, seeing little girls and other people in his home.  Denies nausea or diarrhea.  No known fever or chills.  Does report decreased appetite due to shortness of breath.  Labs on ED arrival include sodium 121, bicarb 34, WBCs 14.4.  Chest x-ray negative for acute changes.  Does show some compression fractures in thoracic spine.   Medications: Outpatient medications: (Not in a hospital admission)   Current medications: Current Facility-Administered Medications  Medication Dose Route Frequency Provider Last Rate Last Admin   0.9 %  sodium chloride infusion   Intravenous Continuous Agbata, Tochukwu, MD       acetaminophen (TYLENOL) tablet 650 mg  650 mg Oral Q6H PRN Agbata, Tochukwu, MD       Or   acetaminophen (TYLENOL) suppository 650 mg  650 mg Rectal Q6H PRN Agbata, Tochukwu, MD       albuterol (PROVENTIL) (2.5 MG/3ML) 0.083% nebulizer solution 2.5 mg  2.5 mg Nebulization Q2H PRN Agbata, Tochukwu, MD       enoxaparin (LOVENOX) injection 40 mg  40 mg Subcutaneous Q24H Agbata, Tochukwu, MD       [START ON 04/09/2022] fluticasone furoate-vilanterol (BREO ELLIPTA) 200-25  MCG/ACT 1 puff  1 puff Inhalation Daily Benita Gutter, RPH       And   [START ON 04/09/2022] umeclidinium bromide (INCRUSE ELLIPTA) 62.5 MCG/ACT 1 puff  1 puff Inhalation Daily Benita Gutter, RPH       gabapentin (NEURONTIN) tablet 600 mg  600 mg Oral TID Collier Bullock, MD       [START ON 04/09/2022] losartan (COZAAR) tablet 100 mg  100 mg Oral Daily Agbata, Tochukwu, MD       melatonin tablet 5 mg  5 mg Oral QHS Agbata, Tochukwu, MD       methylPREDNISolone sodium succinate (SOLU-MEDROL) 40 mg/mL injection 40 mg  40 mg Intravenous Q12H Agbata, Tochukwu, MD       Followed by   Derrill Memo ON 04/10/2022] predniSONE (DELTASONE) tablet 40 mg  40 mg Oral Q breakfast Agbata, Tochukwu, MD       metoprolol tartrate (LOPRESSOR) tablet 100 mg  100 mg Oral Once Agbata, Tochukwu, MD       ondansetron (ZOFRAN) tablet 4 mg  4 mg Oral Q6H PRN Agbata, Tochukwu, MD       Or   ondansetron (ZOFRAN) injection 4 mg  4 mg Intravenous Q6H PRN Agbata, Tochukwu, MD       pravastatin (PRAVACHOL) tablet 20 mg  20 mg Oral q1800 Agbata, Tochukwu, MD       Current Outpatient Medications  Medication Sig Dispense Refill   albuterol (PROVENTIL) (2.5 MG/3ML) 0.083% nebulizer solution Take 3 mLs (2.5 mg total) by nebulization every 4 (four) hours as needed for wheezing or shortness of breath. 75 mL 12   Budeson-Glycopyrrol-Formoterol (BREZTRI AEROSPHERE) 160-9-4.8 MCG/ACT AERO Inhale 2 puffs into the lungs 2 (two) times daily. Take 2 puffs first thing in am and then another 2 puffs about 12 hours later. 10.7 g 11   Calcium Carb-Cholecalciferol (CALCIUM CARBONATE-VITAMIN D3 PO) Take 50 mcg by mouth daily. (Patient not taking: Reported on 03/31/2022)     Cholecalciferol (VITAMIN D3) 50 MCG (2000 UT) CHEW 1 tablet     gabapentin (NEURONTIN) 600 MG tablet Take 600 mg by mouth 3 (three) times daily.     HYDROcodone-Acetaminophen 7.5-300 MG TABS Patient takes as needed (Patient not taking: Reported on 03/31/2022)     loratadine  (CLARITIN) 10 MG tablet Take 10 mg by mouth daily as needed for allergies.     losartan (COZAAR) 100 MG tablet Take 100 mg by mouth daily.     lovastatin (MEVACOR) 20 MG tablet Take 20 mg by mouth daily.      melatonin 5 MG TABS Take 5 mg by mouth at bedtime.     metoprolol tartrate (LOPRESSOR) 100 MG tablet Take 1 tablet (100 mg total) by mouth once for 1 dose. Take TWO hours prior to CT procedure 1 tablet 0   naproxen sodium (ALEVE) 220 MG tablet Take 220 mg by mouth daily as needed.     predniSONE (DELTASONE) 10 MG tablet Take 4 for three days 3 for three days 2 for three days 1 for three days and stop (Patient not taking: Reported on 03/31/2022) 30 tablet 0   PROAIR HFA 108 (90 Base) MCG/ACT inhaler Inhale 2 puffs into the lungs every 6 (six) hours as needed for shortness of breath or wheezing.     triamcinolone cream (KENALOG) 0.1 % Apply 1 application topically daily as needed for rash.     triamterene-hydrochlorothiazide (MAXZIDE-25) 37.5-25 MG tablet TAKE 1 TABLET BY MOUTH EVERY DAY IN THE MORNING        Allergies: Allergies  Allergen Reactions   Amoxicillin-Pot Clavulanate Nausea Only      Past Medical History: Past Medical History:  Diagnosis Date   Arthritis    Cancer ([place])    skin cancer bil knees   COPD (chronic obstructive pulmonary disease) (HCC)    Dyspnea    increased exertion; pt states can walk a flight of stairs w/o having to stop to catch breath    Hypertension    Nicotine dependence, cigarettes, uncomplicated    Peripheral neuropathy    Pure hypercholesterolemia    Sebaceous cyst    scrotal area    Viral wart      Past Surgical History: Past Surgical History:  Procedure Laterality Date   ankles     tarsal tunnel surgery bilat   BACK SURGERY  2008 and 10/22/16   herniated disc   broken knee cap  2002   calf surgery Bilateral    lengethen the tendons   COLONOSCOPY     crushed L-4 vertebrae  1980   KNEE ARTHROSCOPY     x3 bilat/ 2 times left knee    KYPHOPLASTY N/A 03/13/2021  Procedure: KYPHOPLASTY - T11 and L2;  Surgeon: Hessie Knows, MD;  Location: [facility];  Service: Orthopedics;  Laterality: N/A;   LEG SKIN LESION  BIOPSY / EXCISION     Bil knees   LUMBAR LAMINECTOMY/DECOMPRESSION MICRODISCECTOMY N/A 10/22/2016   Procedure: MICRO LUMBAR DECOMPRESSION L3-L4, L4-L5 REVISION  2 LEVELS;  Surgeon: Susa Day, MD;  Location: [facility];  Service: Orthopedics;  Laterality: N/A;  Requests 150 mins   PERIPHERAL VASCULAR CATHETERIZATION N/A 04/22/2016   Procedure: Abdominal Aortogram w/ bilateral Lower Extremity Runoff;  Surgeon: Serafina Mitchell, MD;  Location: [place] [facility];  Service: Cardiovascular;  Laterality: N/A;   PERIPHERAL VASCULAR CATHETERIZATION Left 04/22/2016   Procedure: Peripheral Vascular Atherectomy;  Surgeon: Serafina Mitchell, MD;  Location: [place] [facility];  Service: Cardiovascular;  Laterality: Left;   POLYPECTOMY     ruptured disc  2008   SHOULDER OPEN ROTATOR CUFF REPAIR Right 03/19/2017   Procedure: Mini open rotator cuff repair;  Surgeon: Susa Day, MD;  Location: [facility];  Service: Orthopedics;  Laterality: Right;  with block   SHOULDER SURGERY Right 2019   tendons in calves lengthened     plantar fascitis   TOTAL HIP ARTHROPLASTY  2012   left     Family History: Family History  Problem Relation Age of Onset   Colon cancer Son    Liver cancer Son    Lung cancer Father    Pancreatic cancer Neg Hx    Stomach cancer Neg Hx    Esophageal cancer Neg Hx    Rectal cancer Neg Hx      Social History: Social History   Socioeconomic History   Marital status: Divorced    Spouse name: Not on file   Number of children: Not on file   Years of education: Not on file   Highest education level: Not on file  Occupational History   Not on file  Tobacco Use   Smoking status: Some Days    Packs/day: 1.50    Years: 42.00    Total pack years: 63.00    Types: Cigarettes   Smokeless tobacco: Never    Tobacco comments:    12-15 Cig daily-03/07/2022  Vaping Use   Vaping Use: Never used  Substance and Sexual Activity   Alcohol use: Yes    Alcohol/week: 10.0 standard drinks of alcohol    Types: 10 Shots of liquor per week    Comment: 3 days per week vodka    Drug use: No   Sexual activity: Not on file  Other Topics Concern   Not on file  Social History Narrative   Not on file   Social Determinants of Health   Financial Resource Strain: Not on file  Food Insecurity: Not on file  Transportation Needs: Not on file  Physical Activity: Not on file  Stress: Not on file  Social Connections: Not on file  Intimate Partner Violence: Not on file     Review of Systems: Review of Systems  Constitutional:  Negative for chills, fever and malaise/fatigue.  HENT:  Negative for congestion, sore throat and tinnitus.   Eyes:  Negative for blurred vision and redness.  Respiratory:  Positive for sputum production and shortness of breath. Negative for cough and wheezing.   Cardiovascular:  Negative for chest pain, palpitations, claudication and leg swelling.  Gastrointestinal:  Negative for abdominal pain, blood in stool, diarrhea, nausea and vomiting.  Genitourinary:  Negative for flank pain, frequency and hematuria.  Musculoskeletal:  Negative for back pain, falls and myalgias.  Skin:  Negative for rash.  Neurological:  Negative for dizziness, weakness and headaches.  Endo/Heme/Allergies:  Does not bruise/bleed easily.  Psychiatric/Behavioral:  Positive for hallucinations. Negative for depression. The patient is not nervous/anxious and does not have insomnia.     Vital Signs: Blood pressure 129/80, pulse 96, temperature 98.6 F (37 C), temperature source Oral, height 6' (1.829 m), weight 65.8 kg.  Weight trends: Filed Weights   04/08/22 1129  Weight: 65.8 kg    Physical Exam: General: Ill-appearing  Head: Normocephalic, atraumatic. Moist oral mucosal membranes  Eyes: Anicteric   Lungs:  Crackles and rhonchi throughout,  orthopneic, [place] O2  Heart: Regular rate and rhythm  Abdomen:  Soft, nontender  Extremities:  1+ peripheral edema.  Neurologic: Nonfocal, moving all four extremities  Skin: No lesions  Access: None     Lab results: Basic Metabolic Panel: Recent Labs  Lab 04/07/22 1710 04/08/22 1131  NA 126* 121*  K 4.3 4.7  CL 86* 77*  CO2 28 34*  GLUCOSE 84 111*  BUN 15 19  CREATININE 0.54* 0.73  CALCIUM 8.6* 9.5    Liver Function Tests: Recent Labs  Lab 04/07/22 1710  AST 25  ALT 15  ALKPHOS 68  BILITOT 1.0  PROT 6.5  ALBUMIN 3.8   No results for input(s): "LIPASE", "AMYLASE" in the last 168 hours. No results for input(s): "AMMONIA" in the last 168 hours.  CBC: Recent Labs  Lab 04/07/22 1710 04/08/22 1131  WBC 10.9* 14.4*  HGB 13.6 13.2  HCT 40.6 38.5*  MCV 92.5 90.0  PLT 201 227    Cardiac Enzymes: No results for input(s): "CKTOTAL", "CKMB", "CKMBINDEX", "TROPONINI" in the last 168 hours.  BNP: Invalid input(s): "POCBNP"  CBG: No results for input(s): "GLUCAP" in the last 168 hours.  Microbiology: Results for orders placed or performed during the hospital encounter of 03/11/21  SARS CORONAVIRUS 2 (TAT 6-24 HRS) Nasopharyngeal     Status: None   Collection Time: 03/11/21  3:13 PM   Specimen: Nasopharyngeal  Result Value Ref Range Status   SARS Coronavirus 2 NEGATIVE NEGATIVE Final    Comment: (NOTE) SARS-CoV-2 target nucleic acids are NOT DETECTED.  The SARS-CoV-2 RNA is generally detectable in upper and lower respiratory specimens during the acute phase of infection. Negative results do not preclude SARS-CoV-2 infection, do not rule out co-infections with other pathogens, and should not be used as the sole basis for treatment or other patient management decisions. Negative results must be combined with clinical observations, patient history, and epidemiological information. The expected result is  Negative.  Fact Sheet for Patients: SugarRoll.be  Fact Sheet for Healthcare Providers: https://www.woods-mathews.com/  This test is not yet approved or cleared by the [place] FDA and  has been authorized for detection and/or diagnosis of SARS-CoV-2 by FDA under an Emergency Use Authorization (EUA). This EUA will remain  in effect (meaning this test can be used) for the duration of the COVID-19 declaration under Se ction 564(b)(1) of the Act, 21 U.S.C. section 360bbb-3(b)(1), unless the authorization is terminated or revoked sooner.  Performed at [facility], [place] [address] [place],  [address]     Coagulation Studies: No results for input(s): "LABPROT", "INR" in the last 72 hours.  Urinalysis: No results for input(s): "COLORURINE", "LABSPEC", "PHURINE", "GLUCOSEU", "HGBUR", "BILIRUBINUR", "KETONESUR", "PROTEINUR", "UROBILINOGEN", "NITRITE", "LEUKOCYTESUR" in the last 72 hours.  Invalid input(s): "APPERANCEUR"    Imaging: DG Chest 2 View  Result Date: 04/08/2022 CLINICAL DATA:  Shortness of breath. EXAM: CHEST - 2 VIEW COMPARISON:  Chest x-ray dated November 14, 2021. FINDINGS: The heart size and mediastinal contours are within normal limits. Normal pulmonary vascularity. The lungs remain hyperinflated with emphysematous changes. No focal consolidation, pleural effusion, or pneumothorax. Chronic T8, T11, and L1 compression deformities. T9, T10, and T12 compression fractures are new since February. IMPRESSION: 1. No acute cardiopulmonary disease. 2. T9, T10, and T12 compression fractures are new since February. Correlate with point tenderness. Electronically Signed   By: Titus Dubin M.D.   On: 04/08/2022 12:14     Assessment & Plan: Mark Stevenson is a 65 y.o.  male with past medical history including COPD, hypertension, skin cancer, arthritis, smoking, and peripheral neuropathy, who was admitted to [facility] on  04/08/2022 for Hyponatremia [E87.1]   Hyponatremia likely due to continued diuretic use with poor oral intake.  Serum and urine osmolality pending.  Patient does have extensive tobacco use history and may benefit from chest CT to evaluate lung mass.  Patient currently on normal saline.  We will consider stopping this and provide 1 dose of tolvaptan.Will discuss this with attending.   2.  Hypertension, essential.  Home regimen includes losartan, hydrochlorothiazide and metoprolol.  Diuretic currently held.  Blood pressure controlled at this time.     LOS: 0 Sylvio Weatherall 7/11/20232:51 PM

## 2022-04-08 NOTE — H&P (Signed)
History and Physical    Patient: Mark Stevenson OBS:[id number] DOB: 07-21-1956 DOA: 04/08/2022 DOS: the patient was seen and examined on 04/08/2022 PCP: Alroy Dust, L.Marlou Sa, MD  Patient coming from: Home  Chief Complaint:  Chief Complaint  Patient presents with   Shortness of Breath   HPI: [name] is a 66 y.o. male with medical history significant for nicotine dependence, chronic low back pain, COPD with chronic respiratory failure on 4 L of oxygen continuous, hypertension who presents to the ER via EMS for evaluation of worsening shortness of breath from his baseline associated with a cough productive of yellow phlegm as well as visual hallucinations.  Patient has had symptoms for about 2 days Patient states that he has been seen people and confetti.  He knows they are not real but continues to see them.  He presented to the ER on 04/07/22 but left without being seen. He was brought in by EMS and in route to the hospital he received 3 DuoNebs and Solu-Medrol 125 mg. He complains of feeling dizzy but denies any falls or any loss of consciousness.  He denies having any abdominal pain, no nausea, no vomiting, no changes in his bowel habits, no urinary symptoms, no headache, no fever, no chills, no blurred vision no focal deficit.     Review of Systems: As mentioned in the history of present illness. All other systems reviewed and are negative. Past Medical History:  Diagnosis Date   Arthritis    Cancer ([place])    skin cancer bil knees   COPD (chronic obstructive pulmonary disease) (HCC)    Dyspnea    increased exertion; pt states can walk a flight of stairs w/o having to stop to catch breath    Hypertension    Nicotine dependence, cigarettes, uncomplicated    Peripheral neuropathy    Pure hypercholesterolemia    Sebaceous cyst    scrotal area    Viral wart    Past Surgical History:  Procedure Laterality Date   ankles     tarsal tunnel surgery bilat   BACK SURGERY  2008  and 10/22/16   herniated disc   broken knee cap  2002   calf surgery Bilateral    lengethen the tendons   COLONOSCOPY     crushed L-4 vertebrae  1980   KNEE ARTHROSCOPY     x3 bilat/ 2 times left knee   KYPHOPLASTY N/A 03/13/2021   Procedure: KYPHOPLASTY - T11 and L2;  Surgeon: Hessie Knows, MD;  Location: [facility];  Service: Orthopedics;  Laterality: N/A;   LEG SKIN LESION  BIOPSY / EXCISION     Bil knees   LUMBAR LAMINECTOMY/DECOMPRESSION MICRODISCECTOMY N/A 10/22/2016   Procedure: MICRO LUMBAR DECOMPRESSION L3-L4, L4-L5 REVISION  2 LEVELS;  Surgeon: Susa Day, MD;  Location: [facility];  Service: Orthopedics;  Laterality: N/A;  Requests 150 mins   PERIPHERAL VASCULAR CATHETERIZATION N/A 04/22/2016   Procedure: Abdominal Aortogram w/ bilateral Lower Extremity Runoff;  Surgeon: Serafina Mitchell, MD;  Location: [place] [facility];  Service: Cardiovascular;  Laterality: N/A;   PERIPHERAL VASCULAR CATHETERIZATION Left 04/22/2016   Procedure: Peripheral Vascular Atherectomy;  Surgeon: Serafina Mitchell, MD;  Location: [place] [facility];  Service: Cardiovascular;  Laterality: Left;   POLYPECTOMY     ruptured disc  2008   SHOULDER OPEN ROTATOR CUFF REPAIR Right 03/19/2017   Procedure: Mini open rotator cuff repair;  Surgeon: Susa Day, MD;  Location: [facility];  Service: Orthopedics;  Laterality:  Right;  with block   SHOULDER SURGERY Right 2019   tendons in calves lengthened     plantar fascitis   TOTAL HIP ARTHROPLASTY  2012   left   Social History:  reports that he has been smoking cigarettes. He has a 63.00 pack-year smoking history. He has never used smokeless tobacco. He reports current alcohol use of about 10.0 standard drinks of alcohol per week. He reports that he does not use drugs.  Allergies  Allergen Reactions   Amoxicillin-Pot Clavulanate Nausea Only    Family History  Problem Relation Age of Onset   Colon cancer Son    Liver cancer Son    Lung cancer Father    Pancreatic  cancer Neg Hx    Stomach cancer Neg Hx    Esophageal cancer Neg Hx    Rectal cancer Neg Hx     Prior to Admission medications   Medication Sig Start Date End Date Taking? Authorizing Provider  albuterol (PROVENTIL) (2.5 MG/3ML) 0.083% nebulizer solution Take 3 mLs (2.5 mg total) by nebulization every 4 (four) hours as needed for wheezing or shortness of breath. 12/26/21   Tanda Rockers, MD  Budeson-Glycopyrrol-Formoterol (BREZTRI AEROSPHERE) 160-9-4.8 MCG/ACT AERO Inhale 2 puffs into the lungs 2 (two) times daily. Take 2 puffs first thing in am and then another 2 puffs about 12 hours later. 03/07/22   Tanda Rockers, MD  Calcium Carb-Cholecalciferol (CALCIUM CARBONATE-VITAMIN D3 PO) Take 50 mcg by mouth daily. Patient not taking: Reported on 03/31/2022    [provider]  Cholecalciferol (VITAMIN D3) 50 MCG (2000 UT) CHEW 1 tablet    [provider]  gabapentin (NEURONTIN) 600 MG tablet Take 600 mg by mouth 3 (three) times daily.    [provider]  HYDROcodone-Acetaminophen 7.5-300 MG TABS Patient takes as needed Patient not taking: Reported on 03/31/2022 11/15/21   [provider]  loratadine (CLARITIN) 10 MG tablet Take 10 mg by mouth daily as needed for allergies.    [provider]  losartan (COZAAR) 100 MG tablet Take 100 mg by mouth daily.    [provider]  lovastatin (MEVACOR) 20 MG tablet Take 20 mg by mouth daily.     [provider]  melatonin 5 MG TABS Take 5 mg by mouth at bedtime.    [provider]  metoprolol tartrate (LOPRESSOR) 100 MG tablet Take 1 tablet (100 mg total) by mouth once for 1 dose. Take TWO hours prior to CT procedure 03/31/22 03/31/22  Minna Merritts, MD  naproxen sodium (ALEVE) 220 MG tablet Take 220 mg by mouth daily as needed.    [provider]  predniSONE (DELTASONE) 10 MG tablet Take 4 for three days 3 for three days 2 for three days 1 for three days and stop Patient not taking:  Reported on 03/31/2022 03/07/22   Tanda Rockers, MD  PROAIR HFA [phone] Base) MCG/ACT inhaler Inhale 2 puffs into the lungs every 6 (six) hours as needed for shortness of breath or wheezing. 02/11/17   [provider]  triamcinolone cream (KENALOG) 0.1 % Apply 1 application topically daily as needed for rash. 08/19/16   [provider]  triamterene-hydrochlorothiazide (MAXZIDE-25) 37.5-25 MG tablet TAKE 1 TABLET BY MOUTH EVERY DAY IN THE MORNING    [provider]    Physical Exam: Vitals:   04/08/22 1129 04/08/22 1132  BP:  129/80  Pulse:  96  Temp:  98.6 F (37 C)  TempSrc:  Oral  Weight: 65.8 kg   Height: 6' (1.829 m)    Physical Exam Vitals and nursing note reviewed.  Constitutional:      Appearance: He is well-developed.     Comments: Chronically ill-appearing.  Looks older than stated age.  HENT:     Head: Normocephalic and atraumatic.     Mouth/Throat:     Mouth: Mucous membranes are moist.  Eyes:     Pupils: Pupils are equal, round, and reactive to light.  Cardiovascular:     Rate and Rhythm: Normal rate and regular rhythm.  Pulmonary:     Effort: Tachypnea present.     Breath sounds: Examination of the right-upper field reveals wheezing. Examination of the left-upper field reveals wheezing. Examination of the right-middle field reveals wheezing. Examination of the left-middle field reveals wheezing. Examination of the right-lower field reveals wheezing. Examination of the left-lower field reveals wheezing. Wheezing present.  Abdominal:     General: Bowel sounds are normal.     Palpations: Abdomen is soft.  Musculoskeletal:        General: Normal range of motion.     Cervical back: Normal range of motion and neck supple.  Skin:    General: Skin is warm and dry.  Neurological:     General: No focal deficit present.     Mental Status: He is alert.  Psychiatric:        Mood and Affect: Mood normal.        Behavior: Behavior normal.      Data Reviewed: Relevant notes from primary care and specialist visits, past discharge summaries as available in EHR, including Care Everywhere. Prior diagnostic testing as pertinent to current admission diagnoses Updated medications and problem lists for reconciliation ED course, including vitals, labs, imaging, treatment and response to treatment Triage notes, nursing and pharmacy notes and ED provider's notes Notable results as noted in HPI Labs reviewed.  Sodium 121, potassium 4.7, chloride 77, bicarb 24, glucose 111, BUN 19, creatinine 0.73, calcium 9.5, white count 14.4, hemoglobin 13.2, hematocrit 38.5, RDW 12.1, platelet count 227 Twelve-lead EKG reviewed by me shows sinus rhythm.  Borderline ST changes involving the anterior lateral leads. There are no new results to review at this time.  Assessment and Plan: * Hyponatremia Patient noted to have hyponatremia and is symptomatic as evidenced by visual hallucinations. Hyponatremia appears to be secondary to hydrochlorothiazide use Discontinue hydrochlorothiazide Gentle IV fluid hydration Obtain urine sodium, serum and urine osmolality Repeat sodium levels in a.m. Consult nephrology     Hypertension Continue metoprolol and Cozaar for blood pressure control  COPD with acute exacerbation ([place]) Patient with a history of COPD with chronic respiratory failure on 4 liters of oxygen continuous who presents for evaluation of worsening shortness of breath associated with a cough productive of yellow phlegm and wheezing. Place patient on scheduled and as needed bronchodilator therapy Continue inhaled and systemic steroids Supportive care with antitussives  Chronic respiratory failure with hypoxia [facility]) Treatment as outlined in # 3      Advance Care Planning:   Code Status: Full Code   Consults: Nephrology  Family Communication: Greater than 50% of time was spent discussing patient's condition and plan of care with him at  the bedside.  All questions and concerns have been addressed.  He verbalizes understanding and agrees with the plan.  CODE STATUS was discussed and he wishes to be DO NOT RESUSCITATE.  He lists his sister as his healthcare power of attorney.  Severity  of Illness: The appropriate patient status for this patient is INPATIENT. Inpatient status is judged to be reasonable and necessary in order to provide the required intensity of service to ensure the patient's safety. The patient's presenting symptoms, physical exam findings, and initial radiographic and laboratory data in the context of their chronic comorbidities is felt to place them at high risk for further clinical deterioration. Furthermore, it is not anticipated that the patient will be medically stable for discharge from the hospital within 2 midnights of admission.   * I certify that at the point of admission it is my clinical judgment that the patient will require inpatient hospital care spanning beyond 2 midnights from the point of admission due to high intensity of service, high risk for further deterioration and high frequency of surveillance required.*  Author: Collier Bullock, MD 04/08/2022 2:51 PM  For on call review [email or web address].

## 2022-04-08 NOTE — ED Notes (Addendum)
Patient somnolent. Opens eyes to painful stimuli. Follows commands to raise arms and can weakly raise legs. Able to state first name but confused to place time and situation. Pupils equal and reactive. No facial droop. Neomia Glass, NP messaged via secure chat.

## 2022-04-08 NOTE — ED Provider Notes (Signed)
[facility] Provider Note    Event Date/Time   First MD Initiated Contact with Patient 04/08/22 1150     (approximate)  History   Chief Complaint: Shortness of Breath  HPI  Mark Stevenson is a 66 y.o. male with a past medical history of COPD, hypertension, hyperlipidemia on 3 to 4 L chronically at home presents to the emergency department for worsening shortness of breath.  EMS gave 3 DuoNebs and 125 mg of Solu-Medrol in route to the hospital.  Patient was seen in the emergency department yesterday, but left prior to being seen.  Patient states shortness of breath continued to worsen overnight so he came to the emergency department today for evaluation.  Patient does have occasional cough.  Denies any chest pain.  Denies any fever.  States a history of COPD with similar presentations in the past.  Physical Exam   Triage Vital Signs: ED Triage Vitals  Enc Vitals Group     BP 04/08/22 1132 129/80     Pulse Rate 04/08/22 1132 96     Resp --      Temp 04/08/22 1132 98.6 F (37 C)     Temp Source 04/08/22 1132 Oral     SpO2 --      Weight 04/08/22 1129 145 lb (65.8 kg)     Height 04/08/22 1129 6' (1.829 m)     Head Circumference --      Peak Flow --      Pain Score 04/08/22 1129 0     Pain Loc --      Pain Edu? --      Excl. in [place]? --     Most recent vital signs: Vitals:   04/08/22 1132  BP: 129/80  Pulse: 96  Temp: 98.6 F (37 C)    General: Awake, no distress.  CV:  Good peripheral perfusion.  Regular rate and rhythm  Resp:  Mild tachypnea.  Mild rhonchi bilaterally. Abd:  No distention.  Soft, nontender.  No rebound or guarding.   ED Results / Procedures / Treatments   EKG  EKG viewed and interpreted by myself shows a normal sinus rhythm at 92 bpm with a narrow QRS, normal axis, normal intervals, no concerning ST changes.  RADIOLOGY  I have viewed and interpreted the chest x-ray images, has mild bilateral haziness but no obvious  consolidation.   MEDICATIONS ORDERED IN ED: Medications - No data to display   IMPRESSION / MDM / [place] / ED COURSE  I reviewed the triage vital signs and the nursing notes.  Patient's presentation is most consistent with acute presentation with potential threat to life or bodily function.  Patient with a history of COPD on 3 to 4 L chronically presents to the emergency department for shortness of breath.  Patient has mild rhonchi mild tachypnea most consistent with likely COPD exacerbation.  Patient came to the emergency department yesterday but left prior to being treated.  States shortness of breath worsened so he presented back to the emergency department today.  No chest pain.  We will check labs including cardiac enzymes, chest x-ray.  We will continue to closely monitor.  Patient agreeable to plan of care.  Patient's labs have resulted showing significant hyponatremia with a sodium of 121 down from 126 yesterday with a baseline closer to 135-140.  CBC shows leukocytosis of 14,000 which could be due to recent steroid administration.  Could also be due to pneumonia.  We  will obtain a chest x-ray.  Patient sodium was 121.  Patient states over the past 2 days he has been seeing things in the room such as 2 small girls.  Given the patient's hallucinations, shortness of breath and hyponatremia we will admit to the hospital service for ongoing work-up and management.  I have started the patient on normal saline.  Patient agreeable to plan of care.  He has received Solu-Medrol and 2 DuoNeb's in the emergency department.  FINAL CLINICAL IMPRESSION(S) / ED DIAGNOSES   Dyspnea COPD exacerbation Hyponatremia Hallucinations   Note:  This document was prepared using Dragon voice recognition software and may include unintentional dictation errors.   Harvest Dark, MD 04/08/22 1517

## 2022-04-08 NOTE — ED Notes (Signed)
Patient ripped off BiPap mask and yelling "take it off!" Patient states he wants to go to sleep. Educated on need to wear BiPap but patient continues to refuse to wear BiPap. Placed on 3L per [place]. K. Foust notified via secure chat.

## 2022-04-08 NOTE — Assessment & Plan Note (Signed)
Continue metoprolol and Cozaar for blood pressure control

## 2022-04-08 NOTE — ED Triage Notes (Signed)
BIB [facility] from home due to difficulty breathing. 3 duonebs, '125mg'$  solumedrol through 20G to left forearm. Pt wears 3L [place] chronically. Was seen in ED yesterday and left AMA.  Hx of COPD.   HR 82, 100% with duoneb mask, 28RR, et02 49, 142/92

## 2022-04-08 NOTE — Assessment & Plan Note (Signed)
Patient noted to have hyponatremia and is symptomatic as evidenced by visual hallucinations. Hyponatremia appears to be secondary to hydrochlorothiazide use Discontinue hydrochlorothiazide Gentle IV fluid hydration Obtain urine sodium, serum and urine osmolality Repeat sodium levels in a.m. Consult nephrology

## 2022-04-08 NOTE — ED Notes (Signed)
Pt was assisted with meal tray. The pt did eat approx 20 percent of his meal.

## 2022-04-08 NOTE — Telephone Encounter (Signed)
Called patient due to left emergency department before provider exam to inquire about condition and follow up plans. He says he was going to call 911 and go to [facility].  I told him that he could come back here or go there, but that he really needs to be seen.  I told him if he goes to [facility] to inform them that labs were completed here-so they can look at them.  He asked if he would be seen right away if he came here in an ambulance.  I told him that he may still be sent to the lobby,but I told him that he needs to wait because he really needs to be seen.  He said okay.

## 2022-04-08 NOTE — ED Notes (Signed)
Verbal consent for foley cath due to pt using constant in and out cath.

## 2022-04-08 NOTE — Consult Note (Signed)
PHARMACY CONSULT NOTE - FOLLOW UP  Pharmacy Consult for Electrolyte Monitoring and Replacement   Recent Labs: Potassium (mmol/L)  Date Value  04/08/2022 4.7   Magnesium (mg/dL)  Date Value  03/17/2021 2.1   Calcium (mg/dL)  Date Value  04/08/2022 9.5   Albumin (g/dL)  Date Value  04/07/2022 3.8   Phosphorus (mg/dL)  Date Value  03/17/2021 5.1 (H)   Sodium (mmol/L)  Date Value  04/08/2022 121 (L)     Assessment: 66yo w/ h/o COPD, HTN, skin CA, arthritis, smoking and peripheral neuropathy presenting to [facility] with hyponatremia. Hyponatremia likely due to continued diuretic use with poor oral intake, but serum and urine osmolality pending.   >> +NaCL 3% '@30ml'$ /h (7/11 1600>>  Na: 134 (7/03)>>126(7/10)>121>(started NaCL 3%)>__  Goal of Therapy:  Pharmacy will evaluate and contact MD per safety monitoring for rate of correction as follows  if sodium rises > 4 mEq/L over 2 hours  Or > 6 mEq/L over 4 hours    Plan:  Monitoring of NaCL 3% currently at 39m/hr (PIV 20G) Sodium check at -- q2h (7/11 1800 / 2000 / 2200); then q4h (7/12 0200 / 0600 / 1000 / 1400 / 1800 / 2200)  Contact MD if Na rises > 4 mEq/L over 2 hours or > 6 mEq/L over 4 hours     BLorna Dibble,PharmD Clinical Pharmacist 04/08/2022 4:14 PM

## 2022-04-08 NOTE — Assessment & Plan Note (Signed)
Patient with a history of COPD with chronic respiratory failure on 4 liters of oxygen continuous who presents for evaluation of worsening shortness of breath associated with a cough productive of yellow phlegm and wheezing. Place patient on scheduled and as needed bronchodilator therapy Continue inhaled and systemic steroids Supportive care with antitussives

## 2022-04-08 NOTE — Progress Notes (Signed)
       CROSS COVER NOTE  NAME: KHAIDEN SEGRETO MRN: [id number] DOB : 31-Jul-1956    Date of Service   04/08/2022  HPI/Events of Note   ETCO2 26 on monitor; Tachypneic RR 30s Ativan at 1308 Rhonchi, hyperresonnace on (R) Decreased breath sounds on (L) 100% SPO2 on his baseline 4L [place]  Somnolent purposeful movement when sternum rubbed, equal movement bilaterally  Today's Vitals   04/08/22 1129 04/08/22 1132 04/08/22 1600 04/08/22 1903  BP:  129/80 132/71 105/63  Pulse:  96 95 79  Resp:   (!) 25 20  Temp:  98.6 F (37 C)    TempSrc:  Oral    SpO2:   100% 100%  Weight: 65.8 kg     Height: 6' (1.829 m)     PainSc: 0-No pain 0-No pain     Body mass index is 19.67 kg/m.    Interventions   Plan: Draw q4H Na now ABG-CO2? CXR Discontinue 3% Na (corrected 5 in 9 hrs) NS       This document was prepared using Dragon voice recognition software and may include unintentional dictation errors.  Neomia Glass DNP, MHA, FNP-BC Nurse Practitioner [facility] Pager [phone]

## 2022-04-08 NOTE — ED Notes (Signed)
RT called to inform that physician has ordered BiPap.

## 2022-04-09 DIAGNOSIS — J441 Chronic obstructive pulmonary disease with (acute) exacerbation: Secondary | ICD-10-CM | POA: Diagnosis not present

## 2022-04-09 DIAGNOSIS — L8990 Pressure ulcer of unspecified site, unspecified stage: Secondary | ICD-10-CM | POA: Insufficient documentation

## 2022-04-09 DIAGNOSIS — E871 Hypo-osmolality and hyponatremia: Secondary | ICD-10-CM | POA: Diagnosis not present

## 2022-04-09 DIAGNOSIS — J9622 Acute and chronic respiratory failure with hypercapnia: Secondary | ICD-10-CM | POA: Diagnosis not present

## 2022-04-09 DIAGNOSIS — J9621 Acute and chronic respiratory failure with hypoxia: Secondary | ICD-10-CM | POA: Diagnosis not present

## 2022-04-09 LAB — CBC
HCT: 35 % — ABNORMAL LOW (ref 39.0–52.0)
Hemoglobin: 11.6 g/dL — ABNORMAL LOW (ref 13.0–17.0)
MCH: 30.8 pg (ref 26.0–34.0)
MCHC: 33.1 g/dL (ref 30.0–36.0)
MCV: 92.8 fL (ref 80.0–100.0)
Platelets: 174 10*3/uL (ref 150–400)
RBC: 3.77 MIL/uL — ABNORMAL LOW (ref 4.22–5.81)
RDW: 12.1 % (ref 11.5–15.5)
WBC: 8.6 10*3/uL (ref 4.0–10.5)
nRBC: 0 % (ref 0.0–0.2)

## 2022-04-09 LAB — BASIC METABOLIC PANEL
Anion gap: 4 — ABNORMAL LOW (ref 5–15)
BUN: 14 mg/dL (ref 8–23)
CO2: 36 mmol/L — ABNORMAL HIGH (ref 22–32)
Calcium: 8.9 mg/dL (ref 8.9–10.3)
Chloride: 90 mmol/L — ABNORMAL LOW (ref 98–111)
Creatinine, Ser: 0.72 mg/dL (ref 0.61–1.24)
GFR, Estimated: >60 mL/min (ref >60)
Glucose, Bld: 98 mg/dL (ref 70–99)
Potassium: 4.7 mmol/L (ref 3.5–5.1)
Sodium: 130 mmol/L — ABNORMAL LOW (ref 135–145)

## 2022-04-09 LAB — SODIUM: Sodium: 130 mmol/L — ABNORMAL LOW (ref 135–145)

## 2022-04-09 LAB — TROPONIN I (HIGH SENSITIVITY): Troponin I (High Sensitivity): 10 ng/L (ref <18)

## 2022-04-09 MED ORDER — NICOTINE 14 MG/24HR TD PT24
14.0000 mg | MEDICATED_PATCH | Freq: Every day | TRANSDERMAL | Status: DC
  Administered 2022-04-09: 14 mg via TRANSDERMAL
  Filled 2022-04-09: qty 1

## 2022-04-09 MED ORDER — NICOTINE 21 MG/24HR TD PT24
21.0000 mg | MEDICATED_PATCH | Freq: Every day | TRANSDERMAL | Status: DC
  Administered 2022-04-09 – 2022-04-14 (×6): 21 mg via TRANSDERMAL
  Filled 2022-04-09 (×6): qty 1

## 2022-04-09 MED ORDER — ORAL CARE MOUTH RINSE: 15.0000 mL | OROMUCOSAL | Status: DC | PRN

## 2022-04-09 MED ORDER — NICOTINE 21 MG/24HR TD PT24: 21.0000 mg | MEDICATED_PATCH | Freq: Every day | TRANSDERMAL | Status: DC

## 2022-04-09 MED ORDER — NICOTINE 14 MG/24HR TD PT24: 14.0000 mg | MEDICATED_PATCH | Freq: Every day | TRANSDERMAL | Status: DC

## 2022-04-09 MED ORDER — NICOTINE POLACRILEX 2 MG MT GUM
2.0000 mg | CHEWING_GUM | OROMUCOSAL | Status: DC | PRN
  Filled 2022-04-09: qty 1

## 2022-04-09 NOTE — ED Notes (Signed)
Informed RN bed assigned 

## 2022-04-09 NOTE — ED Notes (Addendum)
Patient states he has someone coming to pick him up. Patient informed that if he leaves against medical advice he could die. Verbalized understanding. Morton Amy notified via secure chat.

## 2022-04-09 NOTE — Hospital Course (Addendum)
Mark Stevenson is a 66 y.o. male with medical history significant for nicotine dependence, chronic low back pain, COPD with chronic respiratory failure on 4 L of oxygen continuous, hypertension who presents to the ER via EMS for evaluation of worsening shortness of breath from his baseline associated with a cough productive of yellow phlegm as well as visual hallucinations. He was found to have significant hyponatremia, was giving IV fluids with normal saline, sodium level went up gradually. Patient also developed significant altered mental status, ABG showed significant CO2 retention, he was placed on BiPAP overnight.  Patient was also given IV steroids followed with oral.  7/14.  Patient condition appears to be terminal, he was seen by palliative care, referred to hospice.  Patient request going home with hospice on Monday.

## 2022-04-09 NOTE — ED Notes (Signed)
Patient yelling "I want to get up!" Patient scooted himself to edge of stretcher and activated bed alarm. Patient informed that it is 4 AM and that he needs to rest. Patient assisted back into bed. Refuses to wear BiPap. Stretcher in low position with two siderails up and call light in reach. Bed alarm on.

## 2022-04-09 NOTE — Progress Notes (Signed)
[facility]  ROUNDING NOTE   Subjective:   Patient seen laying in bed Sister and family friend at bedside Family friend left to allow private discussion of medical treatment Completed breakfast tray on bedside table Continues to have cough, nonproductive Patient states he continues to smoke at home and has plans to continue at discharge Also states he just wants our help Mild lower extremity remains Patient remains on 3 L nasal cannula Sister voices concerns of abdominal distention  Objective:  Vital signs in last 24 hours:  Temp:  [98 F (36.7 C)-98.6 F (37 C)] 98 F (36.7 C) (07/11 2210) Pulse Rate:  [56-96] 94 (07/12 1000) Resp:  [15-30] 16 (07/12 1000) BP: (93-132)/(54-80) 104/70 (07/12 1000) SpO2:  [85 %-100 %] 96 % (07/12 1000) FiO2 (%):  [35 %] 35 % (07/12 0200) Weight:  [65.8 kg] 65.8 kg (07/11 1129)  Weight change:  Filed Weights   04/08/22 1129  Weight: 65.8 kg    Intake/Output: I/O last 3 completed shifts: In: 1155.9 [I.V.:155.9; IV Piggyback:1000] Out: 2119 [ERDEY:8144]   Intake/Output this shift:  No intake/output data recorded.  Physical Exam: General: NAD  Head: Normocephalic, atraumatic. Moist oral mucosal membranes  Eyes: Anicteric  Lungs:  Rhonchi and crackles throughout, normal effort, [place] O2, cough  Heart: Regular rate and rhythm  Abdomen:  Soft, nontender, distended   Extremities: 1+ peripheral edema.  Neurologic: Nonfocal, moving all four extremities  Skin: No lesions  Access: None    Basic Metabolic Panel: Recent Labs  Lab 04/07/22 1710 04/08/22 1131 04/08/22 1630 04/08/22 2035 04/09/22 0453  NA 126* 121* 124* 126* 130*  K 4.3 4.7  --   --  4.7  CL 86* 77*  --   --  90*  CO2 28 34*  --   --  36*  GLUCOSE 84 111*  --   --  98  BUN 15 19  --   --  14  CREATININE 0.54* 0.73  --   --  0.72  CALCIUM 8.6* 9.5  --   --  8.9    Liver Function Tests: Recent Labs  Lab 04/07/22 1710  AST 25  ALT 15  ALKPHOS 68   BILITOT 1.0  PROT 6.5  ALBUMIN 3.8   No results for input(s): "LIPASE", "AMYLASE" in the last 168 hours. No results for input(s): "AMMONIA" in the last 168 hours.  CBC: Recent Labs  Lab 04/07/22 1710 04/08/22 1131 04/09/22 0453  WBC 10.9* 14.4* 8.6  HGB 13.6 13.2 11.6*  HCT 40.6 38.5* 35.0*  MCV 92.5 90.0 92.8  PLT 201 227 174    Cardiac Enzymes: No results for input(s): "CKTOTAL", "CKMB", "CKMBINDEX", "TROPONINI" in the last 168 hours.  BNP: Invalid input(s): "POCBNP"  CBG: Recent Labs  Lab 04/08/22 2016  GLUCAP 127*    Microbiology: Results for orders placed or performed during the hospital encounter of 03/11/21  SARS CORONAVIRUS 2 (TAT 6-24 HRS) Nasopharyngeal     Status: None   Collection Time: 03/11/21  3:13 PM   Specimen: Nasopharyngeal  Result Value Ref Range Status   SARS Coronavirus 2 NEGATIVE NEGATIVE Final    Comment: (NOTE) SARS-CoV-2 target nucleic acids are NOT DETECTED.  The SARS-CoV-2 RNA is generally detectable in upper and lower respiratory specimens during the acute phase of infection. Negative results do not preclude SARS-CoV-2 infection, do not rule out co-infections with other pathogens, and should not be used as the sole basis for treatment or other patient management decisions. Negative  results must be combined with clinical observations, patient history, and epidemiological information. The expected result is Negative.  Fact Sheet for Patients: SugarRoll.be  Fact Sheet for Healthcare Providers: https://www.woods-mathews.com/  This test is not yet approved or cleared by the [place] FDA and  has been authorized for detection and/or diagnosis of SARS-CoV-2 by FDA under an Emergency Use Authorization (EUA). This EUA will remain  in effect (meaning this test can be used) for the duration of the COVID-19 declaration under Se ction 564(b)(1) of the Act, 21 U.S.C. section 360bbb-3(b)(1),  unless the authorization is terminated or revoked sooner.  Performed at [facility], [place] [address] [place], [place] [address]     Coagulation Studies: No results for input(s): "LABPROT", "INR" in the last 72 hours.  Urinalysis: No results for input(s): "COLORURINE", "LABSPEC", "PHURINE", "GLUCOSEU", "HGBUR", "BILIRUBINUR", "KETONESUR", "PROTEINUR", "UROBILINOGEN", "NITRITE", "LEUKOCYTESUR" in the last 72 hours.  Invalid input(s): "APPERANCEUR"    Imaging: DG Chest Port 1 View  Result Date: 04/08/2022 CLINICAL DATA:  66 year old male presents for evaluation of altered mental status. EXAM: PORTABLE CHEST 1 VIEW COMPARISON:  April 08, 2022. FINDINGS: EKG leads project over the chest. Trachea midline. Cardiomediastinal contours and hilar structures are normal. Mitral annular calcifications similar to the prior study. Mild increased interstitial prominence without frank edema. No pneumothorax. On limited assessment there is no acute skeletal process. IMPRESSION: No acute cardiopulmonary disease. Mild interstitial prominence accentuated by technical factors in the background is unchanged and may also be associated with bronchial wall thickening seen on recent imaging. Electronically Signed   By: Zetta Bills M.D.   On: 04/08/2022 21:15   DG Chest 2 View  Result Date: 04/08/2022 CLINICAL DATA:  Shortness of breath. EXAM: CHEST - 2 VIEW COMPARISON:  Chest x-ray dated November 14, 2021. FINDINGS: The heart size and mediastinal contours are within normal limits. Normal pulmonary vascularity. The lungs remain hyperinflated with emphysematous changes. No focal consolidation, pleural effusion, or pneumothorax. Chronic T8, T11, and L1 compression deformities. T9, T10, and T12 compression fractures are new since February. IMPRESSION: 1. No acute cardiopulmonary disease. 2. T9, T10, and T12 compression fractures are new since February. Correlate with point tenderness. Electronically Signed   By:  Titus Dubin M.D.   On: 04/08/2022 12:14     Medications:     enoxaparin (LOVENOX) injection  40 mg Subcutaneous Q24H   fluticasone furoate-vilanterol  1 puff Inhalation Daily   And   umeclidinium bromide  1 puff Inhalation Daily   gabapentin  600 mg Oral TID   losartan  100 mg Oral Daily   melatonin  5 mg Oral QHS   metoprolol tartrate  100 mg Oral Once   nicotine  14 mg Transdermal Q0600   pravastatin  20 mg Oral q1800   [START ON 04/10/2022] predniSONE  40 mg Oral Q breakfast   acetaminophen **OR** acetaminophen, albuterol, nicotine polacrilex, ondansetron **OR** ondansetron (ZOFRAN) IV  Assessment/ Plan:  [name] is a 66 y.o.  male  with past medical history including COPD, hypertension, skin cancer, arthritis, smoking, and peripheral neuropathy, who was admitted to [facility] on 04/08/2022 for  Hyponatremia [E87.1]   Hyponatremia likely due to continued diuretic use with poor oral intake.  Serum and urine osmolality within acceptable range.  Patient received 1 dose tolvaptan yesterday with sodium correction to 130 this morning.  Overnight urine output recorded at 1.5 L.  We will stop IV fluids to prevent dilution of sodium levels and patient  currently maintaining oral intake.  We will continue to monitor sodium levels.  2. Hypertension, essential.  Home regimen includes losartan, hydrochlorothiazide and metoprolol.  Diuretic currently held.  Blood pressure controlled.       LOS: 1 Su Duma 7/12/202310:13 AM

## 2022-04-09 NOTE — Progress Notes (Addendum)
  Progress Note   Patient: Mark Stevenson YBW:[id number] DOB: [birth year] DOA: 04/08/2022     1 DOS: the patient was seen and examined on 04/09/2022   Brief hospital course: Mark Stevenson is a 66 y.o. male with medical history significant for nicotine dependence, chronic low back pain, COPD with chronic respiratory failure on 4 L of oxygen continuous, hypertension who presents to the ER via EMS for evaluation of worsening shortness of breath from his baseline associated with a cough productive of yellow phlegm as well as visual hallucinations. He was found to have significant hyponatremia, was giving IV fluids with normal saline, sodium level went up gradually. Patient also developed significant altered mental status, ABG showed significant CO2 retention, he was placed on BiPAP overnight.  Patient was also given IV steroids followed with oral.  Assessment and Plan: Acute on chronic respiratory failure with hypoxemia and hypercapnia.  POA, ruled in. COPD exacerbation. Acute metabolic encephalopathy. POA Patient does not have evidence of congestive heart failure exacerbation.  He was requiring BiPAP at the time of admission due to significant CO2 retention and hypoxemia. He is also started on IV Solu-Medrol, currently on prednisone. Condition seem to be improving, mental status has improved.  Short of breath is also better.  He still have significant bronchospasm, will continue oral prednisone.  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 Condition improved after giving initially 3% saline infusion and followed with normal saline.  Recheck level tomorrow.  Essential hypertension. Discontinue hydrochlorothiazide, continue Cozaar and metoprolol.  Chronic urinary retention  Patient self cath 3 times a day at home, will keep Foley in while in hospital.    Subjective:  Patient still has some wheezing and short of breath with exertion.  No additional hallucination altered mental  status.  Physical Exam: Vitals:   04/09/22 0900 04/09/22 0930 04/09/22 1000 04/09/22 1159  BP: 116/79 104/73 104/70 113/73  Pulse: 90 94 94 70  Resp: (!) 30 (!) '24 16 18  '$ Temp:    98.5 F (36.9 C)  TempSrc:    Oral  SpO2: (!) 85% 99% 96% 96%  Weight:      Height:       General exam: Appears calm and comfortable  Respiratory system: Decreased breathing sounds with some wheezes. Respiratory effort normal. Cardiovascular system: S1 & S2 heard, RRR. No JVD, murmurs, rubs, gallops or clicks. No pedal edema. Gastrointestinal system: Abdomen is nondistended, soft and nontender. No organomegaly or masses felt. Normal bowel sounds heard. Central nervous system: Alert and oriented. No focal neurological deficits. Extremities: Symmetric 5 x 5 power. Skin: No rashes, lesions or ulcers Psychiatry: Judgement and insight appear normal. Mood & affect appropriate.   Data Reviewed:  X-ray and lab results reviewed.  Family Communication: Wife updated at bedside  Disposition: Status is: Inpatient Remains inpatient appropriate because: Severity of disease.  Planned Discharge Destination: Home with Home Health    Time spent: 35 minutes  Author: Sharen Hones, MD 04/09/2022 1:12 PM  For on call review [email or web address].

## 2022-04-09 NOTE — ED Notes (Signed)
Patient pulled BiPap off, yelling "I want to get up!" Patient reoriented to time. Refused to wear BiPap. Placed on 3L per [place], remained in stretcher. No distress at this time.

## 2022-04-09 NOTE — ED Notes (Signed)
Patient states, "What do I gotta do to get out of here! I feel like a prisoner!" Patient was again, informed of the plan of care and informed that compliance with BiPap would expedite his discharge from the hospital. Patient agreed to wear BiPap. Patient sitting in recliner with bed alarm on and BiPap on.

## 2022-04-09 NOTE — ED Notes (Signed)
Patient agitated. Attempting to climb out of bed. States he wants to smoke cigarette. Assisted to chair. Morton Amy at bedside at this time. Oxygen saturation 73% on room air. States, "I want to get up out of here, I feel like a prisoner." Placed back on 3L per [place]. Oxygen saturation 94%.

## 2022-04-09 NOTE — ED Notes (Signed)
Patient yelling "I want to lie down!" Patient assisted from recliner back to stretcher. Bed alarm on, call light in reach, siderails up. Patient currently compliant with wearing BiPap. No distress noted at this time.

## 2022-04-09 NOTE — ED Notes (Signed)
Patient called sister. States, "I'm not in a good place." Patient requesting sister to come pick her up and take him home.

## 2022-04-10 ENCOUNTER — Inpatient Hospital Stay
Admit: 2022-04-10 | Discharge: 2022-04-10 | Disposition: A | Discharge status: Home / Self Care | Payer: Medicare Other | Attending: Cardiovascular Disease | Admitting: Cardiovascular Disease

## 2022-04-10 DIAGNOSIS — Z7189 Other specified counseling: Secondary | ICD-10-CM

## 2022-04-10 DIAGNOSIS — J441 Chronic obstructive pulmonary disease with (acute) exacerbation: Secondary | ICD-10-CM | POA: Diagnosis not present

## 2022-04-10 DIAGNOSIS — E871 Hypo-osmolality and hyponatremia: Secondary | ICD-10-CM | POA: Diagnosis not present

## 2022-04-10 DIAGNOSIS — R4182 Altered mental status, unspecified: Secondary | ICD-10-CM

## 2022-04-10 DIAGNOSIS — I25118 Atherosclerotic heart disease of native coronary artery with other forms of angina pectoris: Secondary | ICD-10-CM

## 2022-04-10 DIAGNOSIS — J9622 Acute and chronic respiratory failure with hypercapnia: Secondary | ICD-10-CM | POA: Diagnosis not present

## 2022-04-10 DIAGNOSIS — J9621 Acute and chronic respiratory failure with hypoxia: Secondary | ICD-10-CM | POA: Diagnosis not present

## 2022-04-10 DIAGNOSIS — R627 Adult failure to thrive: Secondary | ICD-10-CM

## 2022-04-10 DIAGNOSIS — J9620 Acute and chronic respiratory failure, unspecified whether with hypoxia or hypercapnia: Secondary | ICD-10-CM

## 2022-04-10 DIAGNOSIS — I10 Essential (primary) hypertension: Secondary | ICD-10-CM

## 2022-04-10 LAB — BASIC METABOLIC PANEL
Anion gap: 6 (ref 5–15)
BUN: 19 mg/dL (ref 8–23)
CO2: 36 mmol/L — ABNORMAL HIGH (ref 22–32)
Calcium: 9.8 mg/dL (ref 8.9–10.3)
Chloride: 92 mmol/L — ABNORMAL LOW (ref 98–111)
Creatinine, Ser: 0.84 mg/dL (ref 0.61–1.24)
GFR, Estimated: >60 mL/min (ref >60)
Glucose, Bld: 92 mg/dL (ref 70–99)
Potassium: 3.7 mmol/L (ref 3.5–5.1)
Sodium: 134 mmol/L — ABNORMAL LOW (ref 135–145)

## 2022-04-10 LAB — OSMOLALITY: Osmolality: 272 mOsm/kg — ABNORMAL LOW (ref 275–295)

## 2022-04-10 LAB — MAGNESIUM: Magnesium: 2.4 mg/dL (ref 1.7–2.4)

## 2022-04-10 LAB — PH, GASTRIC FLUID (GASTROCCULT CARD): pH, Gastric: 4

## 2022-04-10 MED ORDER — ONDANSETRON HCL 4 MG/2ML IJ SOLN
4.0000 mg | INTRAMUSCULAR | Status: DC | PRN
  Filled 2022-04-10: qty 2

## 2022-04-10 MED ORDER — CHLORHEXIDINE GLUCONATE CLOTH 2 % EX PADS
6.0000 | MEDICATED_PAD | Freq: Every day | CUTANEOUS | Status: DC
Start: 2022-04-10 — End: 2022-04-14
  Administered 2022-04-10 – 2022-04-14 (×4): 6 via TOPICAL

## 2022-04-10 MED ORDER — PANTOPRAZOLE SODIUM 40 MG IV SOLR
40.0000 mg | Freq: Two times a day (BID) | INTRAVENOUS | Status: DC
  Administered 2022-04-10 – 2022-04-12 (×4): 40 mg via INTRAVENOUS
  Filled 2022-04-10 (×4): qty 10

## 2022-04-10 MED ORDER — OXYCODONE-ACETAMINOPHEN 5-325 MG PO TABS
1.0000 | ORAL_TABLET | Freq: Four times a day (QID) | ORAL | Status: DC | PRN
  Administered 2022-04-10 – 2022-04-14 (×13): 1 via ORAL
  Filled 2022-04-10 (×13): qty 1

## 2022-04-10 MED ORDER — PANTOPRAZOLE SODIUM 40 MG PO TBEC
40.0000 mg | DELAYED_RELEASE_TABLET | Freq: Every day | ORAL | Status: DC
  Administered 2022-04-10: 40 mg via ORAL
  Filled 2022-04-10: qty 1

## 2022-04-10 MED ORDER — ENSURE ENLIVE PO LIQD
237.0000 mL | Freq: Three times a day (TID) | ORAL | Status: DC
  Administered 2022-04-10 – 2022-04-13 (×6): 237 mL via ORAL

## 2022-04-10 MED ORDER — DIPHENHYDRAMINE HCL 25 MG PO CAPS
25.0000 mg | ORAL_CAPSULE | Freq: Once | ORAL | Status: AC
  Administered 2022-04-10: 25 mg via ORAL
  Filled 2022-04-10: qty 1

## 2022-04-10 MED ORDER — ALUM & MAG HYDROXIDE-SIMETH 200-200-20 MG/5ML PO SUSP
30.0000 mL | ORAL | Status: DC | PRN
  Administered 2022-04-10: 30 mL via ORAL
  Filled 2022-04-10: qty 30

## 2022-04-10 MED ORDER — CALCIUM CARBONATE ANTACID 500 MG PO CHEW
1.0000 | CHEWABLE_TABLET | Freq: Three times a day (TID) | ORAL | Status: DC | PRN
Start: 2022-04-10 — End: 2022-04-14
  Administered 2022-04-10: 200 mg via ORAL
  Filled 2022-04-10: qty 1

## 2022-04-10 NOTE — Consult Note (Signed)
Cardiology Consultation:   Patient ID: Mark Stevenson MRN: [id number]; DOB: 1956-09-06  Admit date: 04/08/2022 Date of Consult: 04/10/2022  PCP:  Alroy Dust, L.Marlou Sa, MD   [facility] Providers Cardiologist: [facility] Physician requesting consult: Dr. Roosevelt Locks Reason for consult: Shortness of breath, CHF  Patient Profile:   [name] is a 66 year old gentleman with past medical history of COPD, active smoker 12-15 a day Spinal stenosis, prior back surgery Alcohol abuse, hospital notes June 2022 " drinks 1-1/2 gallon every week of vodka" Hypertension Urine retention, does self cath Coronary artery disease documented on recent cardiac CTA Who presents to the hospital with hallucinations, confusion, weakness, shortness of breath, agitation  History of Present Illness:   [name] presented initially to the hospital July 10 with auditory and visual hallucinations, left before he could be seen/left AMA Represented to the hospital on July 11 with shortness of breath, hallucinations Treated with DuoNebs, steroids for hypoxia He reported seeing people that were not there Initial lab work showing hyponatremia sodium 121, leukocytosis 14,000 He was started on normal saline in the emergency room He was transitioned to hypertonic saline Secondary to mental status changes, agitation, arousable to sternal rub/somnolence after Ativan, was transition back to normal saline and placed on BiPAP Increasing agitation on the BiPAP Also received dose of tolvaptan per nephrology  This morning on my evaluation sister at the bedside He reports that he does not feel " right" Still with shortness of breath, some confusion, weakness Sister reports that he is relatively immobile, some falls, friend helps him do his shopping.  He sits at his bar and plays on his computer most of the day per the sister.  She feels that he needs placement given his failure to thrive Reports still with cough and  shortness of breath this morning Echocardiogram pending Continues on steroids, nebulizers   Past Medical History:  Diagnosis Date   Arthritis    Cancer ([place])    skin cancer bil knees   COPD (chronic obstructive pulmonary disease) (HCC)    Dyspnea    increased exertion; pt states can walk a flight of stairs w/o having to stop to catch breath    Hypertension    Nicotine dependence, cigarettes, uncomplicated    Peripheral neuropathy    Pure hypercholesterolemia    Sebaceous cyst    scrotal area    Viral wart     Past Surgical History:  Procedure Laterality Date   ankles     tarsal tunnel surgery bilat   BACK SURGERY  2008 and 10/22/16   herniated disc   broken knee cap  2002   calf surgery Bilateral    lengethen the tendons   COLONOSCOPY     crushed L-4 vertebrae  1980   KNEE ARTHROSCOPY     x3 bilat/ 2 times left knee   KYPHOPLASTY N/A 03/13/2021   Procedure: KYPHOPLASTY - T11 and L2;  Surgeon: Hessie Knows, MD;  Location: [facility];  Service: Orthopedics;  Laterality: N/A;   LEG SKIN LESION  BIOPSY / EXCISION     Bil knees   LUMBAR LAMINECTOMY/DECOMPRESSION MICRODISCECTOMY N/A 10/22/2016   Procedure: MICRO LUMBAR DECOMPRESSION L3-L4, L4-L5 REVISION  2 LEVELS;  Surgeon: Susa Day, MD;  Location: [facility];  Service: Orthopedics;  Laterality: N/A;  Requests 150 mins   PERIPHERAL VASCULAR CATHETERIZATION N/A 04/22/2016   Procedure: Abdominal Aortogram w/ bilateral Lower Extremity Runoff;  Surgeon: Serafina Mitchell, MD;  Location: [place] [facility];  Service: Cardiovascular;  Laterality: N/A;   PERIPHERAL VASCULAR CATHETERIZATION Left 04/22/2016   Procedure: Peripheral Vascular Atherectomy;  Surgeon: Serafina Mitchell, MD;  Location: [place] [facility];  Service: Cardiovascular;  Laterality: Left;   POLYPECTOMY     ruptured disc  2008   SHOULDER OPEN ROTATOR CUFF REPAIR Right 03/19/2017   Procedure: Mini open rotator cuff repair;  Surgeon: Susa Day, MD;  Location: [facility];   Service: Orthopedics;  Laterality: Right;  with block   SHOULDER SURGERY Right 2019   tendons in calves lengthened     plantar fascitis   TOTAL HIP ARTHROPLASTY  2012   left     Home Medications:  Prior to Admission medications   Medication Sig Start Date End Date Taking? Authorizing Provider  Budeson-Glycopyrrol-Formoterol (BREZTRI AEROSPHERE) 160-9-4.8 MCG/ACT AERO Inhale 2 puffs into the lungs 2 (two) times daily. Take 2 puffs first thing in am and then another 2 puffs about 12 hours later. 03/07/22  Yes Tanda Rockers, MD  Cholecalciferol (VITAMIN D3) 50 MCG (2000 UT) CHEW 1 tablet   Yes [provider]  gabapentin (NEURONTIN) 600 MG tablet Take 600 mg by mouth 3 (three) times daily.   Yes [provider]  loratadine (CLARITIN) 10 MG tablet Take 10 mg by mouth daily as needed for allergies.   Yes [provider]  losartan (COZAAR) 100 MG tablet Take 100 mg by mouth daily.   Yes [provider]  lovastatin (MEVACOR) 20 MG tablet Take 20 mg by mouth daily.    Yes [provider]  triamterene-hydrochlorothiazide (MAXZIDE-25) 37.5-25 MG tablet TAKE 1 TABLET BY MOUTH EVERY DAY IN THE MORNING   Yes [provider]  albuterol (PROVENTIL) (2.5 MG/3ML) 0.083% nebulizer solution Take 3 mLs (2.5 mg total) by nebulization every 4 (four) hours as needed for wheezing or shortness of breath. 12/26/21   Tanda Rockers, MD  Calcium Carb-Cholecalciferol (CALCIUM CARBONATE-VITAMIN D3 PO) Take 50 mcg by mouth daily. Patient not taking: Reported on 03/31/2022    [provider]  HYDROcodone-Acetaminophen 7.5-300 MG TABS Patient takes as needed Patient not taking: Reported on 03/31/2022 11/15/21   [provider]  melatonin 5 MG TABS Take 5 mg by mouth at bedtime.    [provider]  metoprolol tartrate (LOPRESSOR) 100 MG tablet Take 1 tablet (100 mg total) by mouth once for 1 dose. Take TWO hours prior to CT procedure 03/31/22 03/31/22   Minna Merritts, MD  naproxen sodium (ALEVE) 220 MG tablet Take 220 mg by mouth daily as needed.    [provider]  predniSONE (DELTASONE) 10 MG tablet Take 4 for three days 3 for three days 2 for three days 1 for three days and stop Patient not taking: Reported on 03/31/2022 03/07/22   Tanda Rockers, MD  PROAIR HFA 108 [id number] Base) MCG/ACT inhaler Inhale 2 puffs into the lungs every 6 (six) hours as needed for shortness of breath or wheezing. 02/11/17   [provider]  triamcinolone cream (KENALOG) 0.1 % Apply 1 application topically daily as needed for rash. 08/19/16   [provider]    Inpatient Medications: Scheduled Meds:  Chlorhexidine Gluconate Cloth  6 each Topical Daily   enoxaparin (LOVENOX) injection  40 mg Subcutaneous Q24H   fluticasone furoate-vilanterol  1 puff Inhalation Daily   And   umeclidinium bromide  1 puff Inhalation Daily   gabapentin  600 mg Oral TID   losartan  100 mg Oral Daily   melatonin  5 mg Oral QHS   metoprolol tartrate  100 mg Oral Once   nicotine  21 mg Transdermal Q0600   pravastatin  20 mg Oral q1800   predniSONE  40 mg Oral Q breakfast   Continuous Infusions:  PRN Meds: acetaminophen **OR** acetaminophen, albuterol, nicotine polacrilex, ondansetron **OR** ondansetron (ZOFRAN) IV, mouth rinse, oxyCODONE-acetaminophen  Allergies:   No Known Allergies  Social History:   Social History   Socioeconomic History   Marital status: Divorced    Spouse name: Not on file   Number of children: Not on file   Years of education: Not on file   Highest education level: Not on file  Occupational History   Not on file  Tobacco Use   Smoking status: Some Days    Packs/day: 1.50    Years: 42.00    Total pack years: 63.00    Types: Cigarettes   Smokeless tobacco: Never   Tobacco comments:    12-15 Cig daily-03/07/2022  Vaping Use   Vaping Use: Never used  Substance and Sexual Activity   Alcohol use: Yes    Alcohol/week:  10.0 standard drinks of alcohol    Types: 10 Shots of liquor per week    Comment: 3 days per week vodka    Drug use: No   Sexual activity: Not on file  Other Topics Concern   Not on file  Social History Narrative   Not on file   Social Determinants of Health   Financial Resource Strain: Not on file  Food Insecurity: Not on file  Transportation Needs: Not on file  Physical Activity: Not on file  Stress: Not on file  Social Connections: Not on file  Intimate Partner Violence: Not on file    Family History:    Family History  Problem Relation Age of Onset   Colon cancer Son    Liver cancer Son    Lung cancer Father    Pancreatic cancer Neg Hx    Stomach cancer Neg Hx    Esophageal cancer Neg Hx    Rectal cancer Neg Hx      ROS:  Please see the history of present illness.  Review of Systems  Constitutional:  Positive for malaise/fatigue.  HENT: Negative.    Respiratory:  Positive for shortness of breath.   Cardiovascular: Negative.   Gastrointestinal: Negative.   Musculoskeletal: Negative.   Neurological: Negative.   Psychiatric/Behavioral: Negative.    All other systems reviewed and are negative.   Physical Exam/Data:   Vitals:   04/09/22 2359 04/10/22 0319 04/10/22 0756 04/10/22 1208  BP: 100/66 103/75 117/83 103/82  Pulse: 77 86 90 91  Resp: '20 20 16 16  '$ Temp: 98.2 F (36.8 C) 98.2 F (36.8 C) 97.8 F (36.6 C) 98.7 F (37.1 C)  TempSrc: Oral Oral Oral Oral  SpO2: 96% 95% 95% 99%  Weight:      Height:        Intake/Output Summary (Last 24 hours) at 04/10/2022 1232 Last data filed at 04/10/2022 0600 Gross per 24 hour  Intake 480 ml  Output 650 ml  Net -170 ml      04/08/2022   11:29 AM 03/31/2022    9:12 AM 03/07/2022   11:43 AM  Last 3 Weights  Weight (lbs) 145 lb 137 lb 8 oz 145 lb  Weight (kg) 65.772 kg 62.37 kg 65.772 kg     Body mass index is 19.67 kg/m.  General:  Well nourished, well developed, in no acute  distress HEENT: normal Neck:   JVD 8 Vascular: No carotid bruits; Distal pulses 2+ bilaterally Cardiac:  normal S1, S2; RRR; no murmur  Lungs: Moderately decreased breath sounds throughout, poor inspiratory effort, scattered Rales and rhonchi Abd: soft, nontender, no hepatomegaly  Ext: no edema Musculoskeletal:  No deformities, BUE and BLE strength normal and equal Skin: warm and dry  Neuro:  CNs 2-12 intact, no focal abnormalities noted Psych:  Normal affect   EKG:  The EKG was personally reviewed and demonstrates:   Telemetry:  Telemetry was personally reviewed and demonstrates:    Relevant CV Studies: Echocardiogram pending  Cardiac CTA April 03, 2022 1. Coronary calcium score of 1928. This was 95th percentile for age and sex matched control.   2. Normal coronary origin with right dominance.   3. Calcified plaque in the proximal RCA causing severe stenosis (>70%).   4. Calcified plaque in the proximal-mid LAD causing moderate stenosis (50-69%).   5. CAD-RADS 4 Severe stenosis. (70-99% or > 50% left main). Cardiac catheterization is recommended. Consider symptom-guided anti-ischemic pharmacotherapy as well as risk factor modification per guideline directed care.  Laboratory Data:  High Sensitivity Troponin:   Recent Labs  Lab 04/07/22 1710 04/08/22 1131 04/08/22 1630  TROPONINIHS '9 10 12     '$ Chemistry Recent Labs  Lab 04/08/22 1131 04/08/22 1630 04/09/22 0453 04/09/22 1002 04/10/22 0434  NA 121*   < > 130* 130* 134*  K 4.7  --  4.7  --  3.7  CL 77*  --  90*  --  92*  CO2 34*  --  36*  --  36*  GLUCOSE 111*  --  98  --  92  BUN 19  --  14  --  19  CREATININE 0.73  --  0.72  --  0.84  CALCIUM 9.5  --  8.9  --  9.8  MG  --   --   --   --  2.4  GFRNONAA >60  --  >60  --  >60  ANIONGAP 10  --  4*  --  6   < > = values in this interval not displayed.    Recent Labs  Lab 04/07/22 1710  PROT 6.5  ALBUMIN 3.8  AST 25  ALT 15  ALKPHOS 68  BILITOT 1.0   Lipids No results for  input(s): "CHOL", "TRIG", "HDL", "LABVLDL", "LDLCALC", "CHOLHDL" in the last 168 hours.  Hematology Recent Labs  Lab 04/07/22 1710 04/08/22 1131 04/09/22 0453  WBC 10.9* 14.4* 8.6  RBC 4.39 4.28 3.77*  HGB 13.6 13.2 11.6*  HCT 40.6 38.5* 35.0*  MCV 92.5 90.0 92.8  MCH 31.0 30.8 30.8  MCHC 33.5 34.3 33.1  RDW 12.1 12.1 12.1  PLT 201 227 174   Thyroid No results for input(s): "TSH", "FREET4" in the last 168 hours.  BNPNo results for input(s): "BNP", "PROBNP" in the last 168 hours.  DDimer No results for input(s): "DDIMER" in the last 168 hours.   Radiology/Studies:  DG Chest Port 1 View  Result Date: 04/08/2022 CLINICAL DATA:  66 year old male presents for evaluation of altered mental status. EXAM: PORTABLE CHEST 1 VIEW COMPARISON:  April 08, 2022. FINDINGS: EKG leads project over the chest. Trachea midline. Cardiomediastinal contours and hilar structures are normal. Mitral annular calcifications similar to the prior study. Mild increased interstitial prominence without frank edema. No pneumothorax. On limited assessment there is no acute skeletal process. IMPRESSION: No acute cardiopulmonary disease. Mild interstitial prominence accentuated by technical  factors in the background is unchanged and may also be associated with bronchial wall thickening seen on recent imaging. Electronically Signed   By: Zetta Bills M.D.   On: 04/08/2022 21:15   DG Chest 2 View  Result Date: 04/08/2022 CLINICAL DATA:  Shortness of breath. EXAM: CHEST - 2 VIEW COMPARISON:  Chest x-ray dated November 14, 2021. FINDINGS: The heart size and mediastinal contours are within normal limits. Normal pulmonary vascularity. The lungs remain hyperinflated with emphysematous changes. No focal consolidation, pleural effusion, or pneumothorax. Chronic T8, T11, and L1 compression deformities. T9, T10, and T12 compression fractures are new since February. IMPRESSION: 1. No acute cardiopulmonary disease. 2. T9, T10, and T12  compression fractures are new since February. Correlate with point tenderness. Electronically Signed   By: Titus Dubin M.D.   On: 04/08/2022 12:14     Assessment and Plan:   Mental status changes/hallucinations In the setting of hyponatremia, hypercapnia, some improvement with BiPAP Hyponatremia felt secondary to diuretic use and poor intake Component of failure to thrive at home Weakness, history of falls, uses a cane Sodium has been corrected with saline, brief time on hypertonic saline then back to saline, dose of tolvaptan him -Reports not feeling well this morning, likely still recovering Has had poor p.o. intake it would appear, not eating well this morning  2.  Acute on chronic respiratory distress Long history of smoking, underlying COPD Being treated with prednisone Echocardiogram pending Would moderate IV fluids in the setting of COPD exacerbation  3.  Coronary artery disease with stable angina Plan was to meet in clinic this next week to discuss whether he is a candidate for cardiac catheterization given cardiac CTA findings Concern for severe stenosis of RCA Currently not a good candidate for cardiac catheterization given confusion, recovering from hyponatremia, respiratory distress -We will continue to follow and we can guide him on timing of possible heart catheterization as he recovers  Chronic urine retention Does self-catheterization   Total encounter time more than 80 minutes  Greater than 50% was spent in counseling and coordination of care with the patient  For questions or updates, please contact [place] Please consult [email or web address] for contact info under    Signed, Ida Rogue, MD  04/10/2022 12:32 PM

## 2022-04-10 NOTE — Consult Note (Addendum)
Consultation Note Date: 04/10/2022   Patient Name: Mark Stevenson  DOB: 1955/12/09  MRN: [id number]  Age / Sex: 66 y.o., male  PCP: Alroy Dust, L.Marlou Sa, MD Referring Physician: Sharen Hones, MD  Reason for Consultation: Establishing goals of care  HPI/Patient Profile: Mark Stevenson is a 66 y.o. male with medical history significant for nicotine dependence, chronic low back pain, COPD with chronic respiratory failure on 4 L of oxygen continuous, hypertension who presents to the ER via EMS for evaluation of worsening shortness of breath from his baseline associated with a cough productive of yellow phlegm as well as visual hallucinations. He was found to have significant hyponatremia, was giving IV fluids with normal saline, sodium level went up gradually. Patient also developed significant altered mental status, ABG showed significant CO2 retention, he was placed on BiPAP overnight.  Patient was also given IV steroids followed with oral.  Clinical Assessment and Goals of Care: Notes and labs reviewed. In to see patient. He states he is divorced and son is deceased. He states his mother died not long ago, and now his sister is all he has.   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 He states he does not do a lot. He discusses anxiety and shortness of breath. He states he has anxiety that brings about SOB.   We discussed his diagnosis, prognosis, GOC, EOL wishes disposition and options.  Created space and opportunity for patient  to explore thoughts and feelings regarding current medical information.   A detailed discussion was had today regarding advanced directives.  Concepts specific to code status, artifical feeding and hydration, IV antibiotics and rehospitalization were discussed.  The difference between an aggressive medical intervention path and a comfort care path was discussed.  Values and  goals of care important to patient and family were attempted to be elicited.  Discussed limitations of medical interventions to prolong quality of life in some situations and discussed the concept of human mortality.  He states he has not been eating and drinking well. He states he is tired. He states he is not interested in returning to the hospital and wants to be tucked in with hospice care, and an emphasis placed on comfort and dignity.    He states his sister is his HPOA. He called her on his speaker phone.  She discusses their mother dying and that she provided all care for her. She states she needs to consider if she can help him at his home. She is clear he has the financial ability to pay for private duty assistance at his home, and also has the financial ability to self pay for a nursing home with hospice to follow there.   If he goes home with hospice, recommend [facility] so there is an option of the hospice facility if needed. He is not yet full comfort care, but would like to discharge with hospice and not return to the hospital.       SUMMARY OF RECOMMENDATIONS    Patient would  like to be tucked in with hospice either home with hospice or a nursing home (private pay) with hospice to follow.   If he goes home with hospice, recommend [facility] so there is an option of the hospice facility if needed. He is not yet full comfort care, but would like to discharge with hospice, and not return to the hospital.   Notified TOC, RN and MD.     Agree with Oxycodone, would use for both pain and shortness of breath.  Agree with Albuterol.    Prognosis:  < 6 months      Primary Diagnoses: Present on Admission:  Hyponatremia  Hypertension  COPD with acute exacerbation ([place])  Chronic respiratory failure with hypoxia ([place])   I have reviewed the medical record, interviewed the patient and family, and examined the patient. The following aspects are pertinent.  Past Medical  History:  Diagnosis Date   Arthritis    Cancer ([place])    skin cancer bil knees   COPD (chronic obstructive pulmonary disease) ([facility])    Dyspnea    increased exertion; pt states can walk a flight of stairs w/o having to stop to catch breath    Hypertension    Nicotine dependence, cigarettes, uncomplicated    Peripheral neuropathy    Pure hypercholesterolemia    Sebaceous cyst    scrotal area    Viral wart    Social History   Socioeconomic History   Marital status: Divorced    Spouse name: Not on file   Number of children: Not on file   Years of education: Not on file   Highest education level: Not on file  Occupational History   Not on file  Tobacco Use   Smoking status: Some Days    Packs/day: 1.50    Years: 42.00    Total pack years: 63.00    Types: Cigarettes   Smokeless tobacco: Never   Tobacco comments:    12-15 Cig daily-03/07/2022  Vaping Use   Vaping Use: Never used  Substance and Sexual Activity   Alcohol use: Yes    Alcohol/week: 10.0 standard drinks of alcohol    Types: 10 Shots of liquor per week    Comment: 3 days per week vodka    Drug use: No   Sexual activity: Not on file  Other Topics Concern   Not on file  Social History Narrative   Not on file   Social Determinants of Health   Financial Resource Strain: Not on file  Food Insecurity: Not on file  Transportation Needs: Not on file  Physical Activity: Not on file  Stress: Not on file  Social Connections: Not on file   Family History  Problem Relation Age of Onset   Colon cancer Son    Liver cancer Son    Lung cancer Father    Pancreatic cancer Neg Hx    Stomach cancer Neg Hx    Esophageal cancer Neg Hx    Rectal cancer Neg Hx    Scheduled Meds:  Chlorhexidine Gluconate Cloth  6 each Topical Daily   enoxaparin (LOVENOX) injection  40 mg Subcutaneous Q24H   fluticasone furoate-vilanterol  1 puff Inhalation Daily   And   umeclidinium bromide  1 puff Inhalation Daily   gabapentin  600  mg Oral TID   losartan  100 mg Oral Daily   melatonin  5 mg Oral QHS   metoprolol tartrate  100 mg Oral Once   nicotine  21 mg Transdermal Q0600  pravastatin  20 mg Oral q1800   predniSONE  40 mg Oral Q breakfast   Continuous Infusions: PRN Meds:.acetaminophen **OR** acetaminophen, albuterol, nicotine polacrilex, ondansetron **OR** ondansetron (ZOFRAN) IV, mouth rinse, oxyCODONE-acetaminophen Medications Prior to Admission:  Prior to Admission medications   Medication Sig Start Date End Date Taking? Authorizing Provider  Budeson-Glycopyrrol-Formoterol (BREZTRI AEROSPHERE) 160-9-4.8 MCG/ACT AERO Inhale 2 puffs into the lungs 2 (two) times daily. Take 2 puffs first thing in am and then another 2 puffs about 12 hours later. 03/07/22  Yes Tanda Rockers, MD  Cholecalciferol (VITAMIN D3) 50 MCG (2000 UT) CHEW 1 tablet   Yes [provider]  gabapentin (NEURONTIN) 600 MG tablet Take 600 mg by mouth 3 (three) times daily.   Yes [provider]  loratadine (CLARITIN) 10 MG tablet Take 10 mg by mouth daily as needed for allergies.   Yes [provider]  losartan (COZAAR) 100 MG tablet Take 100 mg by mouth daily.   Yes [provider]  lovastatin (MEVACOR) 20 MG tablet Take 20 mg by mouth daily.    Yes [provider]  triamterene-hydrochlorothiazide (MAXZIDE-25) 37.5-25 MG tablet TAKE 1 TABLET BY MOUTH EVERY DAY IN THE MORNING   Yes [provider]  albuterol (PROVENTIL) (2.5 MG/3ML) 0.083% nebulizer solution Take 3 mLs (2.5 mg total) by nebulization every 4 (four) hours as needed for wheezing or shortness of breath. 12/26/21   Tanda Rockers, MD  Calcium Carb-Cholecalciferol (CALCIUM CARBONATE-VITAMIN D3 PO) Take 50 mcg by mouth daily. Patient not taking: Reported on 03/31/2022    [provider]  HYDROcodone-Acetaminophen 7.5-300 MG TABS Patient takes as needed Patient not taking: Reported on 03/31/2022 11/15/21   [provider]   melatonin 5 MG TABS Take 5 mg by mouth at bedtime.    [provider]  metoprolol tartrate (LOPRESSOR) 100 MG tablet Take 1 tablet (100 mg total) by mouth once for 1 dose. Take TWO hours prior to CT procedure 03/31/22 03/31/22  Minna Merritts, MD  naproxen sodium (ALEVE) 220 MG tablet Take 220 mg by mouth daily as needed.    [provider]  predniSONE (DELTASONE) 10 MG tablet Take 4 for three days 3 for three days 2 for three days 1 for three days and stop Patient not taking: Reported on 03/31/2022 03/07/22   Tanda Rockers, MD  PROAIR HFA 108 [phone] Base) MCG/ACT inhaler Inhale 2 puffs into the lungs every 6 (six) hours as needed for shortness of breath or wheezing. 02/11/17   [provider]  triamcinolone cream (KENALOG) 0.1 % Apply 1 application topically daily as needed for rash. 08/19/16   [provider]   No Known Allergies Review of Systems  All other systems reviewed and are negative.   Physical Exam Pulmonary:     Effort: Pulmonary effort is normal.  Neurological:     Mental Status: He is alert.     Vital Signs: BP 95/67 (BP Location: Right Arm)   Pulse 96   Temp 98.7 F (37.1 C) (Oral)   Resp 18   Ht 6' (1.829 m)   Wt 65.8 kg   SpO2 99%   BMI 19.67 kg/m  Pain Scale: 0-10   Pain Score: 0-No pain   SpO2: SpO2: 99 % O2 Device:SpO2: 99 % O2 Flow Rate: .O2 Flow Rate (L/min): 2 L/min  IO: Intake/output summary:  Intake/Output Summary (Last 24 hours) at 04/10/2022 1611 Last data filed at 04/10/2022 1500 Gross per 24 hour  Intake 120 ml  Output 775 ml  Net -655 ml    LBM: Last BM Date : 04/08/22 Baseline Weight: Weight: 65.8 kg Most recent weight: Weight: 65.8 kg      Signed by: Asencion Gowda, NP   Please contact Palliative Medicine Team phone at [phone] for questions and concerns.  For individual provider: See Shea Evans

## 2022-04-10 NOTE — Progress Notes (Signed)
MD paged due to pt c/o heartburn. New order for protonix daily given and prn order for maalox obtained. Pt voiced minimum relief with after 15 min. Pt later vomited roughly 329m of coffee ground emesis. MD notified.

## 2022-04-10 NOTE — Progress Notes (Signed)
  Progress Note   Patient: Mark Stevenson TSV:[id number] DOB: July 27, 1956 DOA: 04/08/2022     2 DOS: the patient was seen and examined on 04/10/2022   Brief hospital course: Mark Stevenson is a 66 y.o. male with medical history significant for nicotine dependence, chronic low back pain, COPD with chronic respiratory failure on 4 L of oxygen continuous, hypertension who presents to the ER via EMS for evaluation of worsening shortness of breath from his baseline associated with a cough productive of yellow phlegm as well as visual hallucinations. He was found to have significant hyponatremia, was giving IV fluids with normal saline, sodium level went up gradually. Patient also developed significant altered mental status, ABG showed significant CO2 retention, he was placed on BiPAP overnight.  Patient was also given IV steroids followed with oral.  Assessment and Plan: Acute on chronic respiratory failure with hypoxemia and hypercapnia.  POA, ruled in. COPD exacerbation. Acute metabolic encephalopathy. POA Patient still has signal short of breath with exertion, but overall condition improving. Continue steroids.   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 Condition improved after giving initially 3% saline infusion and followed with normal saline.    Failure to thrive Patient condition has been deteriorating over the last few months.  Will obtain PEG tube care consult.  Coronary artery disease. Cardiology has seen patient, he was initially scheduled to have echocardiogram and possible heart cath.  Essential hypertension. Continue oral Cozaar and metoprolol.   Chronic urinary retention  Patient self cath 3 times a day at home, will keep Foley in while in hospital.      Subjective:  Patient still has short of breath with exertion.  Occasionally confused. No fever or chills.  He also has a poor appetite.  Physical Exam: Vitals:   04/09/22 2359 04/10/22 0319 04/10/22 0756 04/10/22  1208  BP: 100/66 103/75 117/83 103/82  Pulse: 77 86 90 91  Resp: '20 20 16 16  '$ Temp: 98.2 F (36.8 C) 98.2 F (36.8 C) 97.8 F (36.6 C) 98.7 F (37.1 C)  TempSrc: Oral Oral Oral Oral  SpO2: 96% 95% 95% 99%  Weight:      Height:       General exam: Appears calm and comfortable  Respiratory system: Decreased breathing sounds. Respiratory effort normal. Cardiovascular system: S1 & S2 heard, RRR. No JVD, murmurs, rubs, gallops or clicks. No pedal edema. Gastrointestinal system: Abdomen is nondistended, soft and nontender. No organomegaly or masses felt. Normal bowel sounds heard. Central nervous system: Alert and oriented x2. No focal neurological deficits. Extremities: Symmetric 5 x 5 power. Skin: No rashes, lesions or ulcers Psychiatry: Judgement and insight appear normal. Mood & affect appropriate.   Data Reviewed:  Lab results reviewed.  Family Communication:Sister updated at bedside.  Disposition: Status is: Inpatient Remains inpatient appropriate because: Severity of disease.  Planned Discharge Destination: Skilled nursing facility    Time spent: 35 minutes  Author: Sharen Hones, MD 04/10/2022 1:34 PM  For on call review [email or web address].

## 2022-04-10 NOTE — Evaluation (Signed)
Physical Therapy Evaluation Patient Details Name: MARQUEZ CEESAY MRN: [id number] DOB: 03-01-1956 Today's Date: 04/10/2022  History of Present Illness  Pt is a 66 y.o. male with medical history significant for nicotine dependence, chronic low back pain, COPD with chronic respiratory failure on 4 L of oxygen continuous, hypertension who presents to the ER via EMS for evaluation of worsening shortness of breath from his baseline associated with a cough productive of yellow phlegm as well as visual hallucinations. He was found to have significant hyponatremia, was giving IV fluids with normal saline, sodium level went up gradually. Patient also developed significant altered mental status, ABG showed significant CO2 retention, he was placed on BiPAP.   Clinical Impression  Patient alert, agreeable to PT with some encouragement. The patient reported at baseline he lives alone, modI/I for ADLs, ambulatory with RW, denied any recent falls.  The patient was able to move all extremities against gravity. Supine to sit with minA (pt requested but not necessarily required). Sit <> stand with RW and CGA, and was able to transfer to recliner without physical assist. After a seated rest break he did ambulate ~82f with RW and CGA, pt's main limiter appears to be cardiovascular status. On 2L throughout, spO2 >90% throughout.  Overall the patient demonstrated deficits (see "PT Problem List") that impede the patient's functional abilities, safety, and mobility and would benefit from skilled PT intervention. Recommendation at this time pending further progress is HHPT with intermittent supervision/assistance. A WC would also be beneficial for energy conservation.   Patient suffers from COPD, low back pain which impairs his/her ability to perform daily activities like toileting, feeding, dressing, grooming, bathing in the home. A cane, walker, crutch will not resolve the patient's issue with performing activities of daily  living. A lightweight wheelchair and cushion is required/recommended and will allow patient to safely perform daily activities.   Patient can safely propel the wheelchair in the home or has a caregiver who can provide assistance.          Recommendations for follow up therapy are one component of a multi-disciplinary discharge planning process, led by the attending physician.  Recommendations may be updated based on patient status, additional functional criteria and insurance authorization.  Follow Up Recommendations Home health PT      Assistance Recommended at Discharge Intermittent Supervision/Assistance  Patient can return home with the following  Assistance with cooking/housework;Assist for transportation;Help with stairs or ramp for entrance;A little help with bathing/dressing/bathroom;A little help with walking and/or transfers    Equipment Recommendations None recommended by PT  Recommendations for Other Services       Functional Status Assessment Patient has had a recent decline in their functional status and demonstrates the ability to make significant improvements in function in a reasonable and predictable amount of time.     Precautions / Restrictions Precautions Precautions: Fall Restrictions Weight Bearing Restrictions: No      Mobility  Bed Mobility Overal bed mobility: Needs Assistance Bed Mobility: Supine to Sit     Supine to sit: Min assist, HOB elevated     General bed mobility comments: minA provided because pt asked for it, may not have been required    Transfers Overall transfer level: Needs assistance Equipment used: Rolling walker (2 wheels) Transfers: Sit to/from Stand, Bed to chair/wheelchair/BSC Sit to Stand: Min guard   Step pivot transfers: Min guard            Ambulation/Gait Ambulation/Gait assistance: Min guard Gait Distance (Feet): 6  Feet Assistive device: Rolling walker (2 wheels)         General Gait Details: limited  by activity tolerance  Stairs            Wheelchair Mobility    Modified Rankin (Stroke Patients Only)       Balance Overall balance assessment: Needs assistance Sitting-balance support: Feet supported Sitting balance-Leahy Scale: Fair       Standing balance-Leahy Scale: Fair                               Pertinent Vitals/Pain Pain Assessment Pain Assessment: No/denies pain    Home Living Family/patient expects to be discharged to:: Private residence Living Arrangements: Alone Available Help at Discharge: Family;Available PRN/intermittently (sister lives 40 mins away) Type of Home: House Home Access: Stairs to enter   [employer] of Steps: 3 (rail L) 4 (rail R) 3 (none)   Home Layout: One level Home Equipment: Conservation officer, nature (2 wheels);Rollator (4 wheels);Toilet riser;Cane - single point;Shower seat Additional Comments: no falls reported    Prior Function Prior Level of Function : Driving;Independent/Modified Independent             Mobility Comments: ambulatory with RW ADLs Comments: previous to admission completing independently     Hand Dominance        Extremity/Trunk Assessment   Upper Extremity Assessment Upper Extremity Assessment: Generalized weakness    Lower Extremity Assessment Lower Extremity Assessment: Generalized weakness    Cervical / Trunk Assessment Cervical / Trunk Assessment: Kyphotic  Communication   Communication: No difficulties  Cognition Arousal/Alertness: Awake/alert Behavior During Therapy: WFL for tasks assessed/performed, Flat affect Overall Cognitive Status: Within Functional Limits for tasks assessed                                          General Comments      Exercises     Assessment/Plan    PT Assessment Patient needs continued PT services  PT Problem List Decreased mobility;Decreased activity tolerance;Decreased balance;Cardiopulmonary status limiting  activity       PT Treatment Interventions DME instruction;Therapeutic exercise;Balance training;Stair training;Neuromuscular re-education;Gait training;Functional mobility training;Therapeutic activities;Patient/family education    PT Goals (Current goals can be found in the Care Plan section)  Acute Rehab PT Goals Patient Stated Goal: to be stronger PT Goal Formulation: With patient Time For Goal Achievement: 04/24/22 Potential to Achieve Goals: Good    Frequency Min 2X/week     Co-evaluation               AM-PAC PT "6 Clicks" Mobility  Outcome Measure Help needed turning from your back to your side while in a flat bed without using bedrails?: None Help needed moving from lying on your back to sitting on the side of a flat bed without using bedrails?: None Help needed moving to and from a bed to a chair (including a wheelchair)?: None Help needed standing up from a chair using your arms (e.g., wheelchair or bedside chair)?: None Help needed to walk in hospital room?: A Little Help needed climbing 3-5 steps with a railing? : A Lot 6 Click Score: 21    End of Session Equipment Utilized During Treatment: Oxygen (2L) Activity Tolerance: Patient limited by fatigue Patient left: in chair;with call bell/phone within reach;with family/visitor present Nurse Communication: Mobility status PT Visit  Diagnosis: Other abnormalities of gait and mobility (R26.89);Difficulty in walking, not elsewhere classified (R26.2)    Time: 8657-8469 PT Time Calculation (min) (ACUTE ONLY): 30 min   Charges:   PT Evaluation $PT Eval Low Complexity: 1 Low PT Treatments $Therapeutic Activity: 23-37 mins        Lieutenant Diego PT, DPT 3:55 PM,04/10/22

## 2022-04-10 NOTE — Evaluation (Addendum)
Occupational Therapy Evaluation Patient Details Name: Mark Stevenson MRN: [id number] DOB: 09/07/1956 Today's Date: 04/10/2022   History of Present Illness Pt is a 66 y.o. male with medical history significant for nicotine dependence, chronic low back pain, COPD with chronic respiratory failure on 4 L of oxygen continuous, hypertension who presents to the ER via EMS for evaluation of worsening shortness of breath from his baseline associated with a cough productive of yellow phlegm as well as visual hallucinations. He was found to have significant hyponatremia, was giving IV fluids with normal saline, sodium level went up gradually. Patient also developed significant altered mental status, ABG showed significant CO2 retention, he was placed on BiPAP.   Clinical Impression   Pt seen for OT evaluation this date. Pt was modified independent in all ADL and using RW for mobility, living alone in a 1 story home with 3-4 steps to enter. Pt reports he was on 2L but has gradually been "experimenting" with O2, increasing it to 3-4L consistently without guidance from his MD. Pt reports becoming more easily fatigued or out of breath with minimal exertion. He has paid help for laundry and cleaning 1x/wk and reports able to complete showering and light meal prep with increased effort/time. He states that he keeps his cell phone on him and has a life alert pendant as well. Pt demonstrated ability to complete LB dressing using figure 4 technique and no direct assist required. CGA for ADL transfers with RW and amb in room with RW and SBA-CGA 5'+5'+5'+5'+5'+5' with very brief standing rest breaks. Pt endorsed 8/10 PRE afterwards. HR up to 117, SpO2 93-97% during session, on 2L O2. Pt currently presents with decreased strength, activity tolerance, and SOB with exertion, impacting his quality of life and safety/indep with bathing, meal prep, and ADL mobility among other IADL tasks. Pt educated in energy conservation  strategies including pursed lip breathing, activity pacing, home/routines modifications, AE/DME, prioritizing of meaningful occupations, and falls prevention. Pt verbalized understanding and would benefit from additional skilled OT services to maximize recall and carryover of learned techniques and facilitate implementation of learned techniques into daily routines. Upon discharge, recommend [place] services. Pt would also benefit from a [place] Aide to assist with showering and meal prep.    Patient suffers from COPD, low back pain which impairs his ability to perform daily activities like toileting, dressing, grooming, bathing, meal prep, and housekeeping tasks in the home. A cane, walker, crutch will not resolve the patient's issue with performing activities of daily living. A lightweight wheelchair and cushion is required/recommended and will allow patient to safely perform daily activities.   Patient can safely propel the wheelchair in the home or has a caregiver who can provide assistance.     Recommendations for follow up therapy are one component of a multi-disciplinary discharge planning process, led by the attending physician.  Recommendations may be updated based on patient status, additional functional criteria and insurance authorization.   Follow Up Recommendations  Home health OT (would also benefit from [place] and community resources)    Assistance Recommended at Discharge PRN  Patient can return home with the following Assistance with cooking/housework;Assist for transportation;Help with stairs or ramp for entrance    Functional Status Assessment  Patient has had a recent decline in their functional status and demonstrates the ability to make significant improvements in function in a reasonable and predictable amount of time.  Equipment Recommendations  Wheelchair (measurements OT);Wheelchair cushion (measurements OT)    Recommendations for Other  Services       Precautions /  Restrictions Precautions Precautions: Fall Restrictions Weight Bearing Restrictions: No      Mobility Bed Mobility               General bed mobility comments: NT, up in recliner at start and end of session    Transfers Overall transfer level: Needs assistance Equipment used: Rolling walker (2 wheels) Transfers: Sit to/from Stand Sit to Stand: Min guard           General transfer comment: increased time/effort      Balance Overall balance assessment: Needs assistance Sitting-balance support: Feet supported Sitting balance-Leahy Scale: Fair     Standing balance support: Bilateral upper extremity supported, During functional activity Standing balance-Leahy Scale: Fair                             ADL either performed or assessed with clinical judgement   ADL                                         General ADL Comments: Pt completed LB dressing using figure 4 technique to doff/don socks (baseline), CGA-SBA for completing LB dressing over hips in standing with UE support on RW, CGA for ADL transfers and ADL mobility (5'+5'+5'+5'+5'+5' with very brief standing rest breaks). Pt endorsed 8/10 PRE with limited ADL mobility in room.     Vision         Perception     Praxis      Pertinent Vitals/Pain Pain Assessment Pain Assessment: No/denies pain     Hand Dominance     Extremity/Trunk Assessment Upper Extremity Assessment Upper Extremity Assessment: Generalized weakness   Lower Extremity Assessment Lower Extremity Assessment: Generalized weakness   Cervical / Trunk Assessment Cervical / Trunk Assessment: Kyphotic   Communication Communication Communication: No difficulties   Cognition Arousal/Alertness: Awake/alert Behavior During Therapy: WFL for tasks assessed/performed Overall Cognitive Status: Within Functional Limits for tasks assessed                                       General Comments  SpO2  93-98% on 2L O2, HR  up to 117 with exertion. Pt endorsed mild dizzy/lightheaded with ADL mobility, BP in sitting afterwards 104/71.    Exercises Other Exercises Other Exercises: Pt educated in home/routines modifications, AE/DME, falls prevention, and activity pacing to support ADL/IADL participation and safety while minimizing over exertion, panic attacks, and falls. Pt verbalized understanding.   Shoulder Instructions      Home Living Family/patient expects to be discharged to:: Private residence Living Arrangements: Alone Available Help at Discharge: Family;Available PRN/intermittently (sister lives 40 mins away) Type of Home: House Home Access: Stairs to enter [employer] of Steps: 3 (rail L) 4 (rail R) 3 (none)   Home Layout: One level     Bathroom Shower/Tub: Occupational psychologist: Standard     Home Equipment: Conservation officer, nature (2 wheels);Rollator (4 wheels);Toilet riser;Cane - single point;Shower seat   Additional Comments: no falls reported      Prior Functioning/Environment Prior Level of Function : Driving;Independent/Modified Independent             Mobility Comments: ambulatory with RW ADLs Comments: previous to admission completing  independently, did endorse increased difficulty with showering and meal prep; has someone come clean his home and do laundry 1x/wk        OT Problem List: Decreased strength;Decreased activity tolerance;Impaired balance (sitting and/or standing);Decreased knowledge of use of DME or AE;Cardiopulmonary status limiting activity      OT Treatment/Interventions: Self-care/ADL training;Therapeutic exercise;Therapeutic activities;DME and/or AE instruction;Energy conservation;Patient/family education;Balance training    OT Goals(Current goals can be found in the care plan section) Acute Rehab OT Goals Patient Stated Goal: go home OT Goal Formulation: With patient Time For Goal Achievement: 04/24/22 Potential to  Achieve Goals: Good ADL Goals Pt Will Perform Lower Body Dressing: with modified independence;sit to/from stand Pt Will Transfer to Toilet: with modified independence;ambulating (LRAD, using ECS) Additional ADL Goal #1: Pt will complete all aspects of bathing with modified independence, utilizing learned ECS to support safety/breath recovery. Additional ADL Goal #2: Pt will verbalize plan to implement at least 3 learned ECS into daily routines to maximize safety/indep with ADL/IADL.  OT Frequency: Min 2X/week    Co-evaluation              AM-PAC OT "6 Clicks" Daily Activity     Outcome Measure Help from another person eating meals?: None Help from another person taking care of personal grooming?: None Help from another person toileting, which includes using toliet, bedpan, or urinal?: A Little Help from another person bathing (including washing, rinsing, drying)?: A Little Help from another person to put on and taking off regular upper body clothing?: None Help from another person to put on and taking off regular lower body clothing?: A Little 6 Click Score: 21   End of Session Equipment Utilized During Treatment: Oxygen;Rolling walker (2 wheels) Nurse Communication: Other (comment) (heartburn)  Activity Tolerance: Patient tolerated treatment well Patient left: in chair;with call bell/phone within reach  OT Visit Diagnosis: Other abnormalities of gait and mobility (R26.89);Muscle weakness (generalized) (M62.81)                Time: 8115-7262 OT Time Calculation (min): 37 min Charges:  OT General Charges $OT Visit: 1 Visit OT Evaluation $OT Eval Moderate Complexity: 1 Mod OT Treatments $Self Care/Home Management : 23-37 mins  Ardeth Perfect., MPH, MS, OTR/L ascom [phone] 04/10/22, 4:05 PM

## 2022-04-11 DIAGNOSIS — E871 Hypo-osmolality and hyponatremia: Secondary | ICD-10-CM | POA: Diagnosis not present

## 2022-04-11 DIAGNOSIS — J441 Chronic obstructive pulmonary disease with (acute) exacerbation: Secondary | ICD-10-CM | POA: Diagnosis not present

## 2022-04-11 DIAGNOSIS — J9621 Acute and chronic respiratory failure with hypoxia: Secondary | ICD-10-CM | POA: Diagnosis not present

## 2022-04-11 DIAGNOSIS — R627 Adult failure to thrive: Secondary | ICD-10-CM | POA: Diagnosis not present

## 2022-04-11 NOTE — Progress Notes (Signed)
[facility]) Hospital Liaison Note   Received request from Transitions of Care Manager, Donny Pique, for hospice services at home after discharge. Chart and patient information under review by [facility] physician. Hospice eligibility approved.   Spoke with patient & Marcie Bal to initiate education related to hospice philosophy, services, and team approach to care. Both verbalized understanding of information given. Per discussion, the plan is for patient to discharge home via private vehicle once cleared to DC.    DME needs discussed. Patient has the following equipment in the home: O2 Concentrator w/ portable tank Patient requests the following equipment for delivery: N/A   Please send signed and completed DNR home with patient/family. Please provide prescriptions at discharge as needed to ensure ongoing symptom management.    [facility] information and contact numbers given to family & above information shared with TOC.   Please call with any questions/concerns.    Thank you for the opportunity to participate in this patient's care.   Daphene Calamity, MSW [facility] Liaison  [phone]

## 2022-04-11 NOTE — Progress Notes (Signed)
  Progress Note   Patient: Mark Stevenson HYW:[id number] DOB: 08-01-1956 DOA: 04/08/2022     3 DOS: the patient was seen and examined on 04/11/2022   Brief hospital course: Mark Stevenson is a 66 y.o. male with medical history significant for nicotine dependence, chronic low back pain, COPD with chronic respiratory failure on 4 L of oxygen continuous, hypertension who presents to the ER via EMS for evaluation of worsening shortness of breath from his baseline associated with a cough productive of yellow phlegm as well as visual hallucinations. He was found to have significant hyponatremia, was giving IV fluids with normal saline, sodium level went up gradually. Patient also developed significant altered mental status, ABG showed significant CO2 retention, he was placed on BiPAP overnight.  Patient was also given IV steroids followed with oral.  7/14.  Patient condition appears to be terminal, he was seen by palliative care, referred to hospice.  Patient request going home with hospice on Monday.  Assessment and Plan: Acute on chronic respiratory failure with hypoxemia and hypercapnia.  POA, ruled in. COPD exacerbation. Acute metabolic encephalopathy. 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  But her long-term prognosis is very poor.  Been seen by palliative care, recommend hospice as outpatient.  Continue steroids for now.   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 Condition improved after giving initially 3% saline infusion and followed with normal saline.     Failure to thrive Home with hospice.   Coronary artery disease. Due to poor prognosis, further work-up was deferred.   Essential hypertension. Continue oral Cozaar and metoprolol.   Chronic urinary retention Keep Foley catheter for comfort        Subjective:  Patient still has signal short of breath with minimal lotion, cough, nonproductive.  No additional  hallucination  Physical Exam: Vitals:   04/11/22 0249 04/11/22 0412 04/11/22 0829 04/11/22 1213  BP:  (!) 1'12/20 91/65 97/69 '$  Pulse: 95 83 90 91  Resp:  '18 16 17  '$ Temp:  98.3 F (36.8 C) 98 F (36.7 C) 97.9 F (36.6 C)  TempSrc:  Oral Oral   SpO2:  94% 95% 91%  Weight:      Height:       General exam: Appears calm and comfortable  Respiratory system: Decreased breath sounds. Respiratory effort normal. Cardiovascular system: S1 & S2 heard, RRR. No JVD, murmurs, rubs, gallops or clicks. No pedal edema. Gastrointestinal system: Abdomen is nondistended, soft and nontender. No organomegaly or masses felt. Normal bowel sounds heard. Central nervous system: Alert and oriented x2. No focal neurological deficits. Extremities: Symmetric 5 x 5 power. Skin: No rashes, lesions or ulcers Psychiatry: Judgement and insight appear normal. Mood & affect appropriate.   Data Reviewed:  Lab results reviewed  Family Communication: I had a long discussion with the sister and the patient about care plan.  Still agreeable for hospice outpatient.  Disposition: Status is: Inpatient Remains inpatient appropriate because: Severity of disease,  Planned Discharge Destination:  Home with hospice    Time spent: 55 minutes, more than 50% of time involving direct patient care.  25 minutes spent on meeting with the patient and family to talk about prognosis.  Author: Sharen Hones, MD 04/11/2022 12:57 PM  For on call review [email or web address].

## 2022-04-11 NOTE — Progress Notes (Signed)
Occupational Therapy Treatment Patient Details Name: Mark Stevenson MRN: [id number] DOB: 02-17-1956 Today's Date: 04/11/2022   History of present illness Pt is a 66 y.o. male with medical history significant for nicotine dependence, chronic low back pain, COPD with chronic respiratory failure on 4 L of oxygen continuous, hypertension who presents to the ER via EMS for evaluation of worsening shortness of breath from his baseline associated with a cough productive of yellow phlegm as well as visual hallucinations. He was found to have significant hyponatremia, was giving IV fluids with normal saline, sodium level went up gradually. Patient also developed significant altered mental status, ABG showed significant CO2 retention, he was placed on BiPAP.   OT comments  Mark Stevenson was seen for OT treatment on this date. Upon arrival to room pt awake/alert, seated in room recliner, requesting to return to bed. Pt agreeable to OT tx session and eager to brush his teeth this PM. He is able to perform ADL management as described below requiring CGA for functional mobility, SUPERVISION for standing grooming tasks, and MIN A For bed mobility at end of session. Pt educated on energy conservation and safety strategies during functional activity and return demonstrated understanding of instruction provided. Pt making good progress toward goals and continues to benefit from skilled OT services to maximize return to PLOF and minimize risk of future falls, injury, caregiver burden, and readmission. Will continue to follow POC as written. Discharge recommendation remains appropriate.     Recommendations for follow up therapy are one component of a multi-disciplinary discharge planning process, led by the attending physician.  Recommendations may be updated based on patient status, additional functional criteria and insurance authorization.    Follow Up Recommendations  Home health OT    Assistance Recommended at  Discharge PRN  Patient can return home with the following  Assistance with cooking/housework;Assist for transportation;Help with stairs or ramp for entrance   Equipment Recommendations  Wheelchair (measurements OT);Wheelchair cushion (measurements OT)    Recommendations for Other Services      Precautions / Restrictions Precautions Precautions: Fall Restrictions Weight Bearing Restrictions: No       Mobility Bed Mobility Overal bed mobility: Needs Assistance Bed Mobility: Sit to Supine       Sit to supine: Min assist   General bed mobility comments: MIN A to bring BLE over EOB during sit>supine t/f    Transfers Overall transfer level: Needs assistance Equipment used: Rolling walker (2 wheels) Transfers: Sit to/from Stand Sit to Stand: Min guard     Step pivot transfers: Min guard     General transfer comment: increased time/effort     Balance Overall balance assessment: Needs assistance Sitting-balance support: Feet supported, No upper extremity supported Sitting balance-Leahy Scale: Good     Standing balance support: Bilateral upper extremity supported, During functional activity, Single extremity supported Standing balance-Leahy Scale: Fair Standing balance comment: Able to stand briefly with single UE support during functional activity. Fatigues quickly.                           ADL either performed or assessed with clinical judgement   ADL Overall ADL's : Needs assistance/impaired     Grooming: Standing;Wash/dry face;Oral care;Set up;Supervision/safety Grooming Details (indicate cue type and reason): Close supervision for safety during standing grooming tasks. Pt requires increased cueing to utilize PLB/ECS with functional activity.  Functional mobility during ADLs: Min guard;Cueing for safety;Rolling walker (2 wheels)      Extremity/Trunk Assessment Upper Extremity Assessment Upper Extremity  Assessment: Generalized weakness   Lower Extremity Assessment Lower Extremity Assessment: Generalized weakness   Cervical / Trunk Assessment Cervical / Trunk Assessment: Kyphotic    Vision Patient Visual Report: No change from baseline     Perception     Praxis      Cognition Arousal/Alertness: Awake/alert Behavior During Therapy: WFL for tasks assessed/performed, Flat affect Overall Cognitive Status: Within Functional Limits for tasks assessed                                          Exercises Other Exercises Other Exercises: Pt provided with reinforcement of prior education on safe use of AE/DME for ADL management falls prevention strategies, energy conservation strategies and routines modifications to support safety and functional independence. Pt return demos understanding of education provided during functional activity as described above. See ADL section for additional detail.    Shoulder Instructions       General Comments SpO2 decr to 85% with pt on 2L during functional activity. Increased to 3L during standing activity to maximize safety and SpO2 rebounds to >/= 92 %. Difficult to assess resting O2 as pt with notably poor profusion and room/therapist pulse ox unable to achieve reading. RN notified and in room at end of session. Pt left on 3L [place].    Pertinent Vitals/ Pain       Pain Assessment Pain Assessment: No/denies pain  Home Living                                          Prior Functioning/Environment              Frequency  Min 2X/week        Progress Toward Goals  OT Goals(current goals can now be found in the care plan section)  Progress towards OT goals: Progressing toward goals  Acute Rehab OT Goals OT Goal Formulation: With patient Time For Goal Achievement: 04/24/22 Potential to Achieve Goals: Good  Plan Discharge plan remains appropriate;Frequency remains appropriate    Co-evaluation                  AM-PAC OT "6 Clicks" Daily Activity     Outcome Measure   Help from another person eating meals?: None Help from another person taking care of personal grooming?: None Help from another person toileting, which includes using toliet, bedpan, or urinal?: A Little Help from another person bathing (including washing, rinsing, drying)?: A Little Help from another person to put on and taking off regular upper body clothing?: None Help from another person to put on and taking off regular lower body clothing?: A Little 6 Click Score: 21    End of Session Equipment Utilized During Treatment: Oxygen;Rolling walker (2 wheels)  OT Visit Diagnosis: Other abnormalities of gait and mobility (R26.89);Muscle weakness (generalized) (M62.81)   Activity Tolerance Patient tolerated treatment well   Patient Left in bed;with call bell/phone within reach;with bed alarm set;with nursing/sitter in room   Nurse Communication  (O2 sats during session)        Time: 2841-3244 OT Time Calculation (min): 24 min  Charges: OT General Charges $OT Visit: 1 Visit  OT Treatments $Self Care/Home Management : 23-37 mins  Shara Blazing, M.S., OTR/L Ascom: [phone] 04/11/22, 3:46 PM

## 2022-04-11 NOTE — Progress Notes (Signed)
Initial Nutrition Assessment  DOCUMENTATION CODES:   Severe malnutrition in context of chronic illness  INTERVENTION:   Ensure Enlive po TID, each supplement provides 350 kcal and 20 grams of protein.  Liberalize diet   Pt at high refeed risk; recommend monitor potassium, magnesium and phosphorus labs daily until stable  NUTRITION DIAGNOSIS:   Severe Malnutrition related to chronic illness (COPD) as evidenced by severe muscle depletion, severe fat depletion.  GOAL:   Patient will meet greater than or equal to 90% of their needs  MONITOR:   PO intake, Supplement acceptance, Labs, Weight trends, Skin, I & O's  REASON FOR ASSESSMENT:   Malnutrition Screening Tool    ASSESSMENT:   66 y/o male with h/o COPD, HTN, HLD, etoh abuse, urinary retention, BPH, DVTs, kyphoplasty 03/13/21 after fall, PVD and cecal perforation s/p R colectomy with VAC placement 03/20/21 with abdominal closure and ileocolonic anastamosis 03/27/35 complicated by intra-abdominal abscess requiring IR drain (placed 03/29/21) and PNA with respiratory failure requiring ventilation with tracheostomy 03/29/21 (decanulated 05/20/21) and surgical G-tube placement 05/07/21 (with revision 05/12/21 d/t colon injury, removed 06/27/21) and who is now admitted with COPD exacerbation, FTT and hyponatremia.  Visited pt's room today. Pt sleeping soundly at time of RD visit; RD did not wake pt. Suspect pt with poor oral intake at baseline r/t etoh abuse as pt is malnourished. Per chart, pt is down 25lbs(15%) in < 1 year; RD unsure how recently weight loss occurred. Palliative care following. Pt has decided to go home with hospice. RD will liberalize pt's diet and add Ensure for pt's pleasure. Pt is at high refeed risk. Per chart, pt eating 75-100% of meals in hospital.   Medications reviewed and include: lovenox, melatonin, nicotine, protonix, prednisone  Labs reviewed: Na 134(L), K 3.7 wnl, Mg wnl  NUTRITION - FOCUSED PHYSICAL  EXAM:  Flowsheet Row Most Recent Value  Orbital Region Moderate depletion  Upper Arm Region Severe depletion  Thoracic and Lumbar Region Severe depletion  Buccal Region Moderate depletion  Temple Region Moderate depletion  Clavicle Bone Region Severe depletion  Clavicle and Acromion Bone Region Severe depletion  Scapular Bone Region Severe depletion  Dorsal Hand Severe depletion  Patellar Region Severe depletion  Anterior Thigh Region Severe depletion  Posterior Calf Region Severe depletion  Edema (RD Assessment) None  Hair Reviewed  Eyes Reviewed  Mouth Reviewed  Skin Reviewed  Nails Reviewed   Diet Order:   Diet Order             Diet regular Room service appropriate? Yes; Fluid consistency: Thin  Diet effective now                  EDUCATION NEEDS:   No education needs have been identified at this time  Skin:  Skin Assessment: Reviewed RN Assessment (Stage I L and R ear)  Last BM:  7/13  Height:   Ht Readings from Last 1 Encounters:  04/08/22 6' (1.829 m)    Weight:   Wt Readings from Last 1 Encounters:  04/08/22 65.8 kg    Ideal Body Weight:  80.9 kg  BMI:  Body mass index is 19.67 kg/m.  Estimated Nutritional Needs:   Kcal:  1900-2200kcal/day  Protein:  95-110g/day  Fluid:  1.7-2.0L/day  Koleen Distance MS, RD, LDN Please refer to [facility] for RD and/or RD on-call/weekend/after hours pager

## 2022-04-11 NOTE — Care Management Important Message (Signed)
Important Message  Patient Details  Name: Mark Stevenson MRN: [id number] Date of Birth: 1956-04-16   Medicare Important Message Given:  Yes     Dannette Barbara 04/11/2022, 12:58 PM

## 2022-04-11 NOTE — Progress Notes (Signed)
Progress Note  Patient Name: Mark Stevenson Date of Encounter: 04/11/2022  [facility] Cardiologist: Dr. Rockey Situ  Subjective   No chest pain,.  Has significant dyspnea related to underlying COPD.  The patient opted for hospice care.  Inpatient Medications    Scheduled Meds:  Chlorhexidine Gluconate Cloth  6 each Topical Daily   enoxaparin (LOVENOX) injection  40 mg Subcutaneous Q24H   feeding supplement  237 mL Oral TID BM   fluticasone furoate-vilanterol  1 puff Inhalation Daily   And   umeclidinium bromide  1 puff Inhalation Daily   gabapentin  600 mg Oral TID   losartan  100 mg Oral Daily   melatonin  5 mg Oral QHS   metoprolol tartrate  100 mg Oral Once   nicotine  21 mg Transdermal Q0600   pantoprazole (PROTONIX) IV  40 mg Intravenous Q12H   predniSONE  40 mg Oral Q breakfast   Continuous Infusions:  PRN Meds: acetaminophen **OR** acetaminophen, albuterol, alum & mag hydroxide-simeth, calcium carbonate, nicotine polacrilex, ondansetron (ZOFRAN) IV, ondansetron **OR** [DISCONTINUED] ondansetron (ZOFRAN) IV, mouth rinse, oxyCODONE-acetaminophen   Vital Signs    Vitals:   04/11/22 0200 04/11/22 0249 04/11/22 0412 04/11/22 0829  BP:   (!) 112/20 91/65  Pulse:  95 83 90  Resp: (!) '23  18 16  '$ Temp:   98.3 F (36.8 C) 98 F (36.7 C)  TempSrc:   Oral Oral  SpO2:   94% 95%  Weight:      Height:        Intake/Output Summary (Last 24 hours) at 04/11/2022 1127 Last data filed at 04/11/2022 0900 Gross per 24 hour  Intake 240 ml  Output 725 ml  Net -485 ml      04/08/2022   11:29 AM 03/31/2022    9:12 AM 03/07/2022   11:43 AM  Last 3 Weights  Weight (lbs) 145 lb 137 lb 8 oz 145 lb  Weight (kg) 65.772 kg 62.37 kg 65.772 kg      Telemetry    Sinus rhythm with PACs- Personally Reviewed  ECG     - Personally Reviewed  Physical Exam   GEN: No acute distress.  Appears older than his stated age. Neck: No JVD Cardiac: RRR, no murmurs, rubs, or gallops.   Respiratory: Very diminished breath sounds bilaterally. GI: Soft, nontender, non-distended  MS: No edema; No deformity. Neuro:  Nonfocal  Psych: Normal affect   Labs    High Sensitivity Troponin:   Recent Labs  Lab 04/07/22 1710 04/08/22 1131 04/08/22 1630  TROPONINIHS '9 10 12     '$ Chemistry Recent Labs  Lab 04/07/22 1710 04/08/22 1131 04/08/22 1630 04/09/22 0453 04/09/22 1002 04/10/22 0434  NA 126* 121*   < > 130* 130* 134*  K 4.3 4.7  --  4.7  --  3.7  CL 86* 77*  --  90*  --  92*  CO2 28 34*  --  36*  --  36*  GLUCOSE 84 111*  --  98  --  92  BUN 15 19  --  14  --  19  CREATININE 0.54* 0.73  --  0.72  --  0.84  CALCIUM 8.6* 9.5  --  8.9  --  9.8  MG  --   --   --   --   --  2.4  PROT 6.5  --   --   --   --   --   ALBUMIN 3.8  --   --   --   --   --  AST 25  --   --   --   --   --   ALT 15  --   --   --   --   --   ALKPHOS 68  --   --   --   --   --   BILITOT 1.0  --   --   --   --   --   GFRNONAA >60 >60  --  >60  --  >60  ANIONGAP 12 10  --  4*  --  6   < > = values in this interval not displayed.    Lipids No results for input(s): "CHOL", "TRIG", "HDL", "LABVLDL", "LDLCALC", "CHOLHDL" in the last 168 hours.  Hematology Recent Labs  Lab 04/07/22 1710 04/08/22 1131 04/09/22 0453  WBC 10.9* 14.4* 8.6  RBC 4.39 4.28 3.77*  HGB 13.6 13.2 11.6*  HCT 40.6 38.5* 35.0*  MCV 92.5 90.0 92.8  MCH 31.0 30.8 30.8  MCHC 33.5 34.3 33.1  RDW 12.1 12.1 12.1  PLT 201 227 174   Thyroid No results for input(s): "TSH", "FREET4" in the last 168 hours.  BNPNo results for input(s): "BNP", "PROBNP" in the last 168 hours.  DDimer No results for input(s): "DDIMER" in the last 168 hours.   Radiology    No results found.  Cardiac Studies     Patient Profile     66 y.o. male with past medical history of COPD, active smoker 12-15 a day,Spinal stenosis, prior back surgery, Alcohol abuse, Hypertension who presented to the hospital with hallucinations, confusion,  weakness, shortness of breath, agitation  Assessment & Plan    1.  Hyponatremia and hypercapnia: Likely the cause of his mental status change.  His mentation seems to be back to baseline.  2.  Advanced COPD with poor functional capacity: Failure to thrive at home and the patient is now considering hospice care which I think is very reasonable.  3.  Coronary artery disease involving native coronary arteries with stable angina: Recent cardiac CTA showed significant one-vessel coronary artery disease involving the right coronary artery.  I do not think he is a good candidate for cardiac catheterization and I do not think this would make a difference in his outcome.  Continue medical therapy.  [facility] will sign off.    Follow up as an outpatient: Can follow-up with Dr. Rockey Situ in few weeks if desired.  For questions or updates, please contact [place] Please consult [email or web address] for contact info under        Signed, [name], MD  04/11/2022, 11:27 AM

## 2022-04-11 NOTE — Progress Notes (Signed)
PT Cancellation Note  Patient Details Name: Mark Stevenson MRN: [id number] DOB: Apr 07, 1956   Cancelled Treatment:    Reason Eval/Treat Not Completed: Other (comment). Pt sleeping but does wake to PT presence. Denied mobility at this time, reported he just worked with OT, had a breathing treatment, and would like to nap.    Lieutenant Diego PT, DPT 3:18 PM,04/11/22

## 2022-04-11 NOTE — TOC Progression Note (Signed)
Transition of Care [facility]) - Progression Note    Patient Details  Name: Mark Stevenson MRN: [id number] Date of Birth: 11/28/1955  Transition of Care [facility]) CM/SW Contact  Laurena Slimmer, RN Phone Number: 04/11/2022, 12:34 PM  Clinical Narrative:    Spoke to patient and his sister at the bedside regarding discharge planning. Patient stated he would like to discharge home with hospice. Referral made to Daphene Calamity from [place].         Expected Discharge Plan and Services                                                 Social Determinants of Health (SDOH) Interventions    Readmission Risk Interventions     No data to display

## 2022-04-12 DIAGNOSIS — E43 Unspecified severe protein-calorie malnutrition: Secondary | ICD-10-CM | POA: Insufficient documentation

## 2022-04-12 MED ORDER — PANTOPRAZOLE SODIUM 40 MG PO TBEC
40.0000 mg | DELAYED_RELEASE_TABLET | Freq: Two times a day (BID) | ORAL | Status: DC
  Administered 2022-04-12 – 2022-04-14 (×4): 40 mg via ORAL
  Filled 2022-04-12 (×4): qty 1

## 2022-04-12 NOTE — Progress Notes (Signed)
[facility]) Hospital Liaison Note  Met with Mr. Bihl and had discussion with sister, Marcie Bal on the telephone. Per discussion, Mr. Tigges has decided he would prefer to be transported by ambulance upon discharge. Marcie Bal requested information regarding private pay caregivers and meals on wheels.   DME needs discussed and adjusted, Patient has currently in the home:    -Oxygen concentrator w/ portable tank    - shower chair    - BSC Patient requesting the following equipment for delivery:    - electric hospital bed with half side rails    -Tray table    Please send signed and completed DNR home with patient/family. Please provide prescriptions at discharge as needed to ensure ongoing symptom management.    [facility] information and contact numbers given to family & above information shared with TOC.   Please call with any questions/concerns.    Thank you for the opportunity to participate in this patient's care.  Bea Laura MSN RN [facility] Liaison [phone]

## 2022-04-12 NOTE — Progress Notes (Signed)
  Progress Note   Patient: Mark Stevenson:[id number] DOB: [birth year] DOA: 04/08/2022     4 DOS: the patient was seen and examined on 04/12/2022   Brief hospital course: EVO ADERMAN is a 66 y.o. male with medical history significant for nicotine dependence, chronic low back pain, COPD with chronic respiratory failure on 4 L of oxygen continuous, hypertension who presents to the ER via EMS for evaluation of worsening shortness of breath from his baseline associated with a cough productive of yellow phlegm as well as visual hallucinations. He was found to have significant hyponatremia, was giving IV fluids with normal saline, sodium level went up gradually. Patient also developed significant altered mental status, ABG showed significant CO2 retention, he was placed on BiPAP overnight.  Patient was also given IV steroids followed with oral.  7/14.  Patient condition appears to be terminal, he was seen by palliative care, referred to hospice.  Patient request going home with hospice on Monday.  Assessment and Plan: Acute on chronic respiratory failure with hypoxemia and hypercapnia.  POA, ruled in. COPD exacerbation. Acute metabolic encephalopathy. 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  But long-term prognosis is very poor.  Been seen by palliative care, recommend hospice as outpatient.  Continue prednisone 40 mg daily.   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 Condition improved after giving initially 3% saline infusion and followed with normal saline.   Recheck a BMP tomorrow.  Failure to thrive Home with hospice.   Coronary artery disease. Due to poor prognosis, further work-up was deferred.   Essential hypertension. Continue oral Cozaar and metoprolol.   Chronic urinary retention Keep Foley catheter for comfort      Subjective:  Patient doing well today, he slept well last night.  Short of breath with exertion which is  baseline.  Physical Exam: Vitals:   04/11/22 2016 04/12/22 0020 04/12/22 0344 04/12/22 0800  BP: 102/68 94/64 103/71 100/68  Pulse: 100 73 87 79  Resp: '18 17 20   '$ Temp: 98.7 F (37.1 C) 97.9 F (36.6 C) (!) 97.5 F (36.4 C) 98.2 F (36.8 C)  TempSrc:  Oral  Oral  SpO2: 97% 94% 94% 95%  Weight:      Height:       General exam: Appears calm and comfortable  Respiratory system: Decreased breath sounds. Respiratory effort normal. Cardiovascular system: S1 & S2 heard, RRR. No JVD, murmurs, rubs, gallops or clicks. No pedal edema. Gastrointestinal system: Abdomen is distended with ventral hernia, soft and nontender. No organomegaly or masses felt. Normal bowel sounds heard. Central nervous system: Alert and oriented. No focal neurological deficits. Extremities: Symmetric 5 x 5 power. Skin: No rashes, lesions or ulcers Psychiatry: Judgement and insight appear normal. Mood & affect appropriate.   Data Reviewed:  No new lab results.  Family Communication:   Disposition: Status is: Inpatient Remains inpatient appropriate because: Unsafe discharge, family could not take patient until Monday.  Planned Discharge Destination:  Home with hospice    Time spent: 32 minutes  Author: Sharen Hones, MD 04/12/2022 12:10 PM  For on call review [email or web address].

## 2022-04-12 NOTE — Progress Notes (Signed)
PHARMACIST - PHYSICIAN COMMUNICATION  CONCERNING: IV to Oral Route Change Policy  RECOMMENDATION: This patient is receiving pantoprazole by the intravenous route.  Based on criteria approved by the Pharmacy and Therapeutics Committee, the intravenous medication(s) is/are being converted to the equivalent oral dose form(s).   DESCRIPTION: These criteria include: The patient is eating (either orally or via tube) and/or has been taking other orally administered medications for a least 24 hours The patient has no evidence of active gastrointestinal bleeding or impaired GI absorption (gastrectomy, short bowel, patient on TNA or NPO).  If you have questions about this conversion, please contact the [place], [facility] 04/12/2022 10:00 AM

## 2022-04-12 NOTE — Plan of Care (Signed)

## 2022-04-13 LAB — BASIC METABOLIC PANEL
Anion gap: 7 (ref 5–15)
BUN: 31 mg/dL — ABNORMAL HIGH (ref 8–23)
CO2: 31 mmol/L (ref 22–32)
Calcium: 8.2 mg/dL — ABNORMAL LOW (ref 8.9–10.3)
Chloride: 98 mmol/L (ref 98–111)
Creatinine, Ser: 0.83 mg/dL (ref 0.61–1.24)
GFR, Estimated: >60 mL/min (ref >60)
Glucose, Bld: 160 mg/dL — ABNORMAL HIGH (ref 70–99)
Potassium: 3.5 mmol/L (ref 3.5–5.1)
Sodium: 136 mmol/L (ref 135–145)

## 2022-04-13 MED ORDER — POTASSIUM CHLORIDE CRYS ER 20 MEQ PO TBCR
40.0000 meq | EXTENDED_RELEASE_TABLET | Freq: Once | ORAL | Status: AC
  Administered 2022-04-13: 40 meq via ORAL
  Filled 2022-04-13: qty 2

## 2022-04-13 MED ORDER — POLYETHYLENE GLYCOL 3350 17 G PO PACK
17.0000 g | PACK | Freq: Every day | ORAL | Status: DC
Start: 2022-04-13 — End: 2022-04-14
  Filled 2022-04-13: qty 1

## 2022-04-13 MED ORDER — SENNOSIDES-DOCUSATE SODIUM 8.6-50 MG PO TABS
2.0000 | ORAL_TABLET | Freq: Two times a day (BID) | ORAL | Status: DC
  Administered 2022-04-13: 2 via ORAL
  Filled 2022-04-13 (×2): qty 2

## 2022-04-13 NOTE — Progress Notes (Signed)
  Progress Note   Patient: Mark Stevenson:[id number] DOB: 1956/04/03 DOA: 04/08/2022     5 DOS: the patient was seen and examined on 04/13/2022   Brief hospital course: Mark Stevenson is a 66 y.o. male with medical history significant for nicotine dependence, chronic low back pain, COPD with chronic respiratory failure on 4 L of oxygen continuous, hypertension who presents to the ER via EMS for evaluation of worsening shortness of breath from his baseline associated with a cough productive of yellow phlegm as well as visual hallucinations. He was found to have significant hyponatremia, was giving IV fluids with normal saline, sodium level went up gradually. Patient also developed significant altered mental status, ABG showed significant CO2 retention, he was placed on BiPAP overnight.  Patient was also given IV steroids followed with oral.  7/14.  Patient condition appears to be terminal, he was seen by palliative care, referred to hospice.  Patient request going home with hospice on Monday.  Assessment and Plan:  Acute on chronic respiratory failure with hypoxemia and hypercapnia.  POA, ruled in. COPD exacerbation. Acute metabolic encephalopathy. 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  But long-term prognosis is very poor.  Been seen by palliative care, recommend hospice as outpatient.  Condition improved, also finished 4 days oral prednisone.   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 Condition improved after giving initially 3% saline infusion and followed with normal saline.   Sodium level has normalized now.   Failure to thrive Home with hospice.   Coronary artery disease. Due to poor prognosis, further work-up was deferred.   Essential hypertension. Continue oral Cozaar and metoprolol.   Chronic urinary retention Keep Foley catheter for comfort     Subjective:  Patient has no new issue, short of breath at  baseline.  Physical Exam: Vitals:   04/12/22 1932 04/12/22 2307 04/13/22 0314 04/13/22 0803  BP: 1'01/64 97/68 96/71 '$ 92/63  Pulse: 93 89 87 71  Resp: '20 20 18 20  '$ Temp: 97.6 F (36.4 C) 99.2 F (37.3 C) 98.6 F (37 C) 98.7 F (37.1 C)  TempSrc: Oral Oral Oral Oral  SpO2: 99% 96% 94%   Weight:      Height:       General exam: Appears calm and comfortable  Respiratory system: Decreased breath sounds. Respiratory effort normal. Cardiovascular system: S1 & S2 heard, RRR. No JVD, murmurs, rubs, gallops or clicks. No pedal edema. Gastrointestinal system: Abdomen is nondistended, soft and nontender. No organomegaly or masses felt. Normal bowel sounds heard. Central nervous system: Alert and oriented. No focal neurological deficits. Extremities: Symmetric 5 x 5 power. Skin: No rashes, lesions or ulcers Psychiatry: Judgement and insight appear normal. Mood & affect appropriate.   Data Reviewed:  Lab results reviewed  Family Communication:   Disposition: Status is: Inpatient Remains inpatient appropriate because: Unsafe discharge, transfer to home tomorrow with hospice  Planned Discharge Destination:  Home with hospice    Time spent: 29 minutes  Author: Sharen Hones, MD 04/13/2022 12:35 PM  For on call review [email or web address].

## 2022-04-13 NOTE — Progress Notes (Signed)
[facility]) Hospital Liaison Note   Met with Mr. Oxley and had brief discussion with sister, Marcie Bal on the telephone.  DME equipment arrived at the home, still planning on transporting home with ambulance, d/c possibly tomorrow.   Will continue to follow along for discharge disposition.    Please send signed and completed DNR home with patient/family. Please provide prescriptions at discharge as needed to ensure ongoing symptom management.    [facility] information and contact numbers given to family & above information shared with TOC.   Please call with any questions/concerns.    Thank you for the opportunity to participate in this patient's care.   Bea Laura MSN RN [facility] Liaison [phone]

## 2022-04-14 MED ORDER — PANTOPRAZOLE SODIUM 40 MG PO TBEC: 40.0000 mg | DELAYED_RELEASE_TABLET | Freq: Every day | ORAL | 0 refills | Status: AC

## 2022-04-14 MED ORDER — NICOTINE 21 MG/24HR TD PT24: 21.0000 mg | MEDICATED_PATCH | Freq: Every day | TRANSDERMAL | 0 refills | Status: AC

## 2022-04-14 NOTE — Discharge Summary (Signed)
Physician Discharge Summary   Patient: Mark Stevenson MRN: [id number] DOB: 11-Jul-1956  Admit date:     04/08/2022  Discharge date: 04/14/22  Discharge Physician: Sharen Hones   PCP: Alroy Dust, L.Marlou Sa, MD   Recommendations at discharge:   Follow-up with hospice Follow-up with PCP in 1 week.  Discharge Diagnoses: Principal Problem:   Hyponatremia Active Problems:   Hypertension   COPD with acute exacerbation (HCC)   Chronic respiratory failure with hypoxia (HCC)   Acute on chronic respiratory failure with hypoxia and hypercapnia (HCC)   Pressure injury of skin   Failure to thrive in adult   Protein-calorie malnutrition, severe  Resolved Problems:   * No resolved hospital problems. *  Hospital Course: Mark Stevenson is a 66 y.o. male with medical history significant for nicotine dependence, chronic low back pain, COPD with chronic respiratory failure on 4 L of oxygen continuous, hypertension who presents to the ER via EMS for evaluation of worsening shortness of breath from his baseline associated with a cough productive of yellow phlegm as well as visual hallucinations. He was found to have significant hyponatremia, was giving IV fluids with normal saline, sodium level went up gradually. Patient also developed significant altered mental status, ABG showed significant CO2 retention, he was placed on BiPAP overnight.  Patient was also given IV steroids followed with oral.  7/14.  Patient condition appears to be terminal, he was seen by palliative care, referred to hospice.  Patient request going home with hospice on Monday.  Assessment and Plan: Acute on chronic respiratory failure with hypoxemia and hypercapnia.  POA, ruled in. COPD exacerbation. Acute metabolic encephalopathy. 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  But long-term prognosis is very poor.  Been seen by palliative care, recommend hospice as outpatient.  Condition  improved, also finished 4 days oral prednisone.   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 Condition improved after giving initially 3% saline infusion and followed with normal saline.   Sodium level has normalized now.   Failure to thrive Home with hospice.   Coronary artery disease. Due to poor prognosis, further work-up was deferred.   Essential hypertension. Continue oral Cozaar and metoprolol.   Chronic urinary retention Keep Foley catheter for comfort  Pressure ulcer POA Pressure Injury 04/09/22 Ear Left Stage 1 -  Intact skin with non-blanchable redness of a localized area usually over a bony prominence. under oxygen tubing (Active)  04/09/22 1448  Location: Ear  Location Orientation: Left  Staging: Stage 1 -  Intact skin with non-blanchable redness of a localized area usually over a bony prominence.  Wound Description (Comments): under oxygen tubing  Present on Admission:      Pressure Injury 04/09/22 Ear Right;Upper Stage 1 -  Intact skin with non-blanchable redness of a localized area usually over a bony prominence. under oxygen tubing (Active)  04/09/22 1449  Location: Ear  Location Orientation: Right;Upper  Staging: Stage 1 -  Intact skin with non-blanchable redness of a localized area usually over a bony prominence.  Wound Description (Comments): under oxygen tubing  Present on Admission:            Consultants: Palliative care Procedures performed: None  Disposition: Hospice care Diet recommendation:  Discharge Diet Orders (From admission, onward)     Start     Ordered   04/14/22 0000  Diet - low sodium heart healthy        04/14/22 0842  Cardiac diet DISCHARGE MEDICATION: Allergies as of 04/14/2022   No Known Allergies      Medication List     STOP taking these medications    CALCIUM CARBONATE-VITAMIN D3 PO   HYDROcodone-Acetaminophen 7.5-300 MG Tabs   losartan 100 MG tablet Commonly known as: COZAAR   naproxen sodium  220 MG tablet Commonly known as: ALEVE   predniSONE 10 MG tablet Commonly known as: DELTASONE   triamterene-hydrochlorothiazide 37.5-25 MG tablet Commonly known as: MAXZIDE-25   Vitamin D3 50 MCG (2000 UT) Chew       TAKE these medications    Breztri Aerosphere 160-9-4.8 MCG/ACT Aero Generic drug: Budeson-Glycopyrrol-Formoterol Inhale 2 puffs into the lungs 2 (two) times daily. Take 2 puffs first thing in am and then another 2 puffs about 12 hours later.   gabapentin 600 MG tablet Commonly known as: NEURONTIN Take 600 mg by mouth 3 (three) times daily.   loratadine 10 MG tablet Commonly known as: CLARITIN Take 10 mg by mouth daily as needed for allergies.   lovastatin 20 MG tablet Commonly known as: MEVACOR Take 20 mg by mouth daily.   melatonin 5 MG Tabs Take 5 mg by mouth at bedtime.   metoprolol tartrate 100 MG tablet Commonly known as: Lopressor Take 1 tablet (100 mg total) by mouth once for 1 dose. Take TWO hours prior to CT procedure   pantoprazole 40 MG tablet Commonly known as: PROTONIX Take 1 tablet (40 mg total) by mouth daily.   ProAir HFA 108 (90 Base) MCG/ACT inhaler Generic drug: albuterol Inhale 2 puffs into the lungs every 6 (six) hours as needed for shortness of breath or wheezing.   albuterol (2.5 MG/3ML) 0.083% nebulizer solution Commonly known as: PROVENTIL Take 3 mLs (2.5 mg total) by nebulization every 4 (four) hours as needed for wheezing or shortness of breath.   triamcinolone cream 0.1 % Commonly known as: KENALOG Apply 1 application topically daily as needed for rash.               Discharge Care Instructions  (From admission, onward)           Start     Ordered   04/14/22 0000  Discharge wound care:       Comments: PCP and hospice follow   04/14/22 0842            Follow-up Information     Alroy Dust, L.Marlou Sa, MD Follow up in 1 week(s).   Specialty: Family Medicine Contact information: [address] [place] [address] [phone]                Discharge Exam: Danley Danker Weights   04/08/22 1129  Weight: 65.8 kg   General exam: Appears calm and comfortable  Respiratory system: Decreased breathing sounds. Respiratory effort normal. Cardiovascular system: S1 & S2 heard, RRR. No JVD, murmurs, rubs, gallops or clicks. No pedal edema. Gastrointestinal system: Abdomen is nondistended, soft and nontender. No organomegaly or masses felt. Normal bowel sounds heard. Central nervous system: Alert and oriented. No focal neurological deficits. Extremities: Symmetric 5 x 5 power. Skin: No rashes, lesions or ulcers Psychiatry: Judgement and insight appear normal. Mood & affect appropriate.    Condition at discharge: fair  The results of significant diagnostics from this hospitalization (including imaging, microbiology, ancillary and laboratory) are listed below for reference.   Imaging Studies: DG Chest Port 1 View  Result Date: 04/08/2022 CLINICAL DATA:  66 year old male presents for evaluation of altered mental  status. EXAM: PORTABLE CHEST 1 VIEW COMPARISON:  April 08, 2022. FINDINGS: EKG leads project over the chest. Trachea midline. Cardiomediastinal contours and hilar structures are normal. Mitral annular calcifications similar to the prior study. Mild increased interstitial prominence without frank edema. No pneumothorax. On limited assessment there is no acute skeletal process. IMPRESSION: No acute cardiopulmonary disease. Mild interstitial prominence accentuated by technical factors in the background is unchanged and may also be associated with bronchial wall thickening seen on recent imaging. Electronically Signed   By: Zetta Bills M.D.   On: 04/08/2022 21:15   DG Chest 2 View  Result Date: 04/08/2022 CLINICAL DATA:  Shortness of breath. EXAM: CHEST - 2 VIEW COMPARISON:  Chest x-ray dated November 14, 2021. FINDINGS: The heart size and mediastinal contours are  within normal limits. Normal pulmonary vascularity. The lungs remain hyperinflated with emphysematous changes. No focal consolidation, pleural effusion, or pneumothorax. Chronic T8, T11, and L1 compression deformities. T9, T10, and T12 compression fractures are new since February. IMPRESSION: 1. No acute cardiopulmonary disease. 2. T9, T10, and T12 compression fractures are new since February. Correlate with point tenderness. Electronically Signed   By: Titus Dubin M.D.   On: 04/08/2022 12:14   CT CORONARY MORPH W/CTA COR W/SCORE W/CA W/CM &/OR WO/CM  Addendum Date: 04/03/2022   ADDENDUM REPORT: 04/03/2022 15:42 ADDENDUM: The following report is an over-read performed by radiologist Dr. Dahlia Bailiff of [facility], [place] on April 03, 2022. This over-read does not include interpretation of cardiac or coronary anatomy or pathology. The coronary calcium score/coronary CTA interpretation by the cardiologist is attached. 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 Aortic atherosclerosis. Mediastinum/Nodes: No pathologically enlarged mediastinal, or hilar lymph nodes in the visualized portions of the thorax. Distal esophagus is grossly unremarkable. Small hiatal hernia. Lungs/Pleura: Diffuse bronchial wall thickening with emphysematous change. Focal pleuroparenchymal scarring along the anterior right middle lobe on image 51/11. Within the visualized portions of the thorax there are no suspicious appearing pulmonary nodules or masses, there is no acute consolidative airspace disease, no pleural effusions and no pneumothorax Upper Abdomen: Visualized portions of the upper abdomen are unremarkable. Musculoskeletal: There are no aggressive appearing lytic or blastic lesions noted in the visualized portions of the skeleton. Prior augmentation of a lower thoracic compression deformity, appears similar to  prior. Thoracic spondylosis. IMPRESSION: 1. 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 Recommend annual imaging followup by CTA or MRA. This recommendation follows 2010 ACCF/AHA/AATS/ACR/ASA/SCA/SCAI/SIR/STS/SVM Guidelines for the Diagnosis and Management of Patients with Thoracic Aortic Disease. Circulation. 2010; 121: J093-O671. Aortic aneurysm NOS (ICD10-I71.9). 2. Diffuse bronchial wall thickening with Emphysema (ICD10-J43.9), as can be seen with COPD. 3.  Aortic Atherosclerosis (ICD10-I70.0). Electronically Signed   By: Dahlia Bailiff M.D.   On: 04/03/2022 15:42   Result Date: 04/03/2022 CLINICAL DATA:  Dyspnea on exertion, fatigue EXAM: Cardiac/Coronary  CTA TECHNIQUE: The patient was scanned on a [employer] scanner. : A prospective scan was triggered in the ascending thoracic aorta. Axial non-contrast 3 mm slices were carried out through the heart. The data set was analyzed on a dedicated work station and scored using the [place]. Gantry rotation speed was 66 msecs and collimation was .6 mm. '100mg'$  of metoprolol and 0.8 mg of sl NTG was given. The 3D data set was reconstructed in 5% intervals of the 60-95 % of the R-R cycle. Diastolic phases were  analyzed on a dedicated work station using MPR, MIP and VRT modes. The patient received 75 cc of contrast. FINDINGS: Aorta: Mild aortic root dilation, measuring 87m. Mild ascending and descending aorta calcifications. No dissection. Aortic Valve:  Trileaflet.  Mild valvular calcifications. Mitral valve: Severe mitral annular calcifications noted. Coronary Arteries:  Normal coronary origin.  Right dominance. RCA is a dominant artery that gives rise to PDA and PLA. There is calcified plaque in the proximal RCA causing severe stenosis (>70%). Left main is a short artery that gives rise to LAD and LCX arteries. There is no LM disease. LAD has calcified plaque in the proximal to mid vessel causing moderate stenosis (50-69%). LCX is a  non-dominant artery that gives rise to two obtuse marginal branches. There is calcified plaque in the proximal LCx causing minimal stenosis (<25%). Other findings: Normal pulmonary vein drainage into the left atrium. Normal left atrial appendage without a thrombus. Normal size of the pulmonary artery. IMPRESSION: 1. Coronary calcium score of 1928. This was 95th percentile for age and sex matched control. 2. Normal coronary origin with right dominance. 3. Calcified plaque in the proximal RCA causing severe stenosis (>70%). 4. Calcified plaque in the proximal-mid LAD causing moderate stenosis (50-69%). 5. CAD-RADS 4 Severe stenosis. (70-99% or > 50% left main). Cardiac catheterization is recommended. Consider symptom-guided anti-ischemic pharmacotherapy as well as risk factor modification per guideline directed care. Electronically Signed: By: BKate SableM.D. On: 04/03/2022 15:28    Microbiology: Results for orders placed or performed during the hospital encounter of 03/11/21  SARS CORONAVIRUS 2 (TAT 6-24 HRS) Nasopharyngeal     Status: None   Collection Time: 03/11/21  3:13 PM   Specimen: Nasopharyngeal  Result Value Ref Range Status   SARS Coronavirus 2 NEGATIVE NEGATIVE Final    Comment: (NOTE) SARS-CoV-2 target nucleic acids are NOT DETECTED.  The SARS-CoV-2 RNA is generally detectable in upper and lower respiratory specimens during the acute phase of infection. Negative results do not preclude SARS-CoV-2 infection, do not rule out co-infections with other pathogens, and should not be used as the sole basis for treatment or other patient management decisions. Negative results must be combined with clinical observations, patient history, and epidemiological information. The expected result is Negative.  Fact Sheet for Patients: hSugarRoll.be Fact Sheet for Healthcare Providers: hhttps://www.woods-mathews.com/ This test is not yet approved or  cleared by the [place] and  has been authorized for detection and/or diagnosis of SARS-CoV-2 by FDA under an Emergency Use Authorization (EUA). This EUA will remain  in effect (meaning this test can be used) for the duration of the COVID-19 declaration under Se ction 564(b)(1) of the Act, 21 U.S.C. section 360bbb-3(b)(1), unless the authorization is terminated or revoked sooner.  [facility]formed at [facility] [address], [place] [address]    Labs: CBC: Recent Labs  Lab 04/07/22 1710 04/08/22 1131 04/09/22 0453  WBC 10.9* 14.4* 8.6  HGB 13.6 13.2 11.6*  HCT 40.6 38.5* 35.0*  MCV 92.5 90.0 92.8  PLT 201 227 1542  Basic Metabolic Panel: Recent Labs  Lab 04/07/22 1710 04/08/22 1131 04/08/22 1630 04/08/22 2035 04/09/22 0453 04/09/22 1002 04/10/22 0434 04/13/22 0443  NA 126* 121*   < > 126* 130* 130* 134* 136  K 4.3 4.7  --   --  4.7  --  3.7 3.5  CL 86* 77*  --   --  90*  --  92* 98  CO2 28  34*  --   --  36*  --  36* 31  GLUCOSE 84 111*  --   --  98  --  92 160*  BUN 15 19  --   --  14  --  19 31*  CREATININE 0.54* 0.73  --   --  0.72  --  0.84 0.83  CALCIUM 8.6* 9.5  --   --  8.9  --  9.8 8.2*  MG  --   --   --   --   --   --  2.4  --    < > = values in this interval not displayed.   Liver Function Tests: Recent Labs  Lab 04/07/22 1710  AST 25  ALT 15  ALKPHOS 68  BILITOT 1.0  PROT 6.5  ALBUMIN 3.8   CBG: Recent Labs  Lab 04/08/22 2016  GLUCAP 127*    Discharge time spent: greater than 30 minutes.  Signed: Sharen Hones, MD [facility] 04/14/2022

## 2022-04-14 NOTE — Care Management Important Message (Signed)
Important Message  Patient Details  Name: Mark Stevenson MRN: [id number] Date of Birth: Jan 12, 1956   Medicare Important Message Given:  Other (see comment)  Disposition to discharge with hospice services.  Medicare IM withheld at this time out of respect for patient and family.   Dannette Barbara 04/14/2022, 11:06 AM

## 2022-04-14 NOTE — TOC Transition Note (Signed)
Transition of Care [facility]) - CM/SW Discharge Note   Patient Details  Name: Mark Stevenson MRN: [id number] Date of Birth: November 16, 1955  Transition of Care [facility]) CM/SW Contact:  Candie Chroman, LCSW Phone Number: 04/14/2022, 12:11 PM   Clinical Narrative:   Patient has orders to discharge home with hospice. Received call back from sister. She confirmed address on facesheet is correct. EMS transport has been arranged and he is 4th on the list. Sister is at his home now waiting for him to arrive. No further concerns. CSW signing off.  Final next level of care: Home w Hospice Care Barriers to Discharge: Barriers Resolved   Patient Goals and CMS Choice        Discharge Placement                Patient to be transferred to facility by: EMS Name of family member notified: Donne Hazel Patient and family notified of of transfer: 04/14/22  Discharge Plan and Services                                     Social Determinants of Health (SDOH) Interventions     Readmission Risk Interventions     No data to display

## 2022-04-14 NOTE — Progress Notes (Signed)
Physical Therapy Treatment Patient Details Name: Mark Stevenson MRN: [id number] DOB: 04/15/1956 Today's Date: 04/14/2022   History of Present Illness Pt is a 66 y.o. male with medical history significant for nicotine dependence, chronic low back pain, COPD with chronic respiratory failure on 4 L of oxygen continuous, hypertension who presents to the ER via EMS for evaluation of worsening shortness of breath from his baseline associated with a cough productive of yellow phlegm as well as visual hallucinations. He was found to have significant hyponatremia, was giving IV fluids with normal saline, sodium level went up gradually. Patient also developed significant altered mental status, ABG showed significant CO2 retention, he was placed on BiPAP.    PT Comments    Pt received in Semi-Fowler's position and agreeable to therapy.  Pt ready to get up in order clean up and prepare for d/c.  Pt moves well with only minimal assistance needed by therapist with HHA provided.  Pt able to come into standing and transfer to the sink with support from the counter.  Pt then performed self care with CGA for safety, washing his face and brushing his teeth.  Pt requested to stay sitting at EOB and was left with nursing tech in room to assist with grooming.  Current discharge plans to home with HHPT remain appropriate at this time.  Pt will continue to benefit from skilled therapy in order to address deficits listed below.     Recommendations for follow up therapy are one component of a multi-disciplinary discharge planning process, led by the attending physician.  Recommendations may be updated based on patient status, additional functional criteria and insurance authorization.  Follow Up Recommendations  Home health PT     Assistance Recommended at Discharge Intermittent Supervision/Assistance  Patient can return home with the following Assistance with cooking/housework;Assist for transportation;Help with  stairs or ramp for entrance;A little help with bathing/dressing/bathroom;A little help with walking and/or transfers   Equipment Recommendations  Wheelchair (measurements PT);Wheelchair cushion (measurements PT);BSC/3in1    Recommendations for Other Services       Precautions / Restrictions Precautions Precautions: Fall Restrictions Weight Bearing Restrictions: No     Mobility  Bed Mobility Overal bed mobility: Needs Assistance Bed Mobility: Sit to Supine     Supine to sit: Min assist, HOB elevated Sit to supine: Min assist   General bed mobility comments: MIN A to bring BLE over EOB during sit>supine t/f    Transfers Overall transfer level: Needs assistance Equipment used: Rolling walker (2 wheels) Transfers: Sit to/from Stand Sit to Stand: Min guard   Step pivot transfers: Min guard       General transfer comment: increased time/effort    Ambulation/Gait Ambulation/Gait assistance: Min guard Gait Distance (Feet): 3 Feet Assistive device: Rolling walker (2 wheels) Gait Pattern/deviations: Step-to pattern       General Gait Details: pt able to stand and perform self-care with support on counter/sink.   Stairs             Wheelchair Mobility    Modified Rankin (Stroke Patients Only)       Balance Overall balance assessment: Needs assistance Sitting-balance support: Feet supported, No upper extremity supported Sitting balance-Leahy Scale: Good Sitting balance - Comments: pt with good balance in sitting.   Standing balance support: Bilateral upper extremity supported, During functional activity, Single extremity supported Standing balance-Leahy Scale: Fair Standing balance comment: Pt able to stand with UE support on counter while performing dynamic tasks (washing face, brushing teeth)  Cognition Arousal/Alertness: Awake/alert Behavior During Therapy: WFL for tasks assessed/performed, Flat affect Overall  Cognitive Status: Within Functional Limits for tasks assessed                                          Exercises      General Comments        Pertinent Vitals/Pain Pain Assessment Pain Assessment: No/denies pain    Home Living                          Prior Function            PT Goals (current goals can now be found in the care plan section) Acute Rehab PT Goals Patient Stated Goal: to be stronger PT Goal Formulation: With patient Time For Goal Achievement: 04/24/22 Potential to Achieve Goals: Good Progress towards PT goals: Progressing toward goals    Frequency    Min 2X/week      PT Plan Current plan remains appropriate    Co-evaluation              AM-PAC PT "6 Clicks" Mobility   Outcome Measure  Help needed turning from your back to your side while in a flat bed without using bedrails?: None Help needed moving from lying on your back to sitting on the side of a flat bed without using bedrails?: None Help needed moving to and from a bed to a chair (including a wheelchair)?: None Help needed standing up from a chair using your arms (e.g., wheelchair or bedside chair)?: None Help needed to walk in hospital room?: A Little Help needed climbing 3-5 steps with a railing? : A Lot 6 Click Score: 21    End of Session Equipment Utilized During Treatment: Oxygen Activity Tolerance: Patient limited by fatigue Patient left: in chair;with call bell/phone within reach;with family/visitor present Nurse Communication: Mobility status PT Visit Diagnosis: Other abnormalities of gait and mobility (R26.89);Difficulty in walking, not elsewhere classified (R26.2)     Time: 6629-4765 PT Time Calculation (min) (ACUTE ONLY): 16 min  Charges:  $Therapeutic Activity: 8-22 mins                     Gwenlyn Saran, PT, DPT 04/14/22, 11:17 AM    Christie Nottingham 04/14/2022, 11:14 AM

## 2022-04-14 NOTE — TOC Progression Note (Addendum)
Transition of Care [facility]) - Progression Note    Patient Details  Name: Mark Stevenson MRN: [id number] Date of Birth: [birth year]  Transition of [facility] CM/SW [place], LCSW Phone Number: 04/14/2022, 10:42 AM  Clinical Narrative:   Asked MD to sign DNR placed on patient's chart. No family at bedside. Left voicemail for sister to see what time she wanted transport to be arranged and confirm address.  11:51 am: Tried calling sister again. Didn't leave a 2nd voicemail.  Expected Discharge Plan and Services           Expected Discharge Date: 04/14/22                                     Social Determinants of Health (SDOH) Interventions    Readmission Risk Interventions     No data to display

## 2022-04-15 ENCOUNTER — Ambulatory Visit: Payer: Medicare Other | Admitting: Physician Assistant

## 2022-04-15 ENCOUNTER — Telehealth: Payer: Self-pay | Admitting: Internal Medicine

## 2022-04-15 NOTE — Telephone Encounter (Signed)
Patient called to inform he is under Hospice care now. Call back number is [phone] if needed.   Dr. Jones Bales

## 2022-04-16 ENCOUNTER — Other Ambulatory Visit: Payer: Medicare Other

## 2022-04-21 ENCOUNTER — Telehealth: Payer: Self-pay | Admitting: Internal Medicine

## 2022-04-21 NOTE — Telephone Encounter (Signed)
Spoke to patient.  He stated he went to the pharmacy to pickup Mark Stevenson, however he was given albuterol solution. He is aware that albuterol should be used as needed. Patient stated that Judithann Sauger will be ready for pickup after 2:00. Nothing further needed.

## 2022-04-21 NOTE — Telephone Encounter (Signed)
Called patient and left voicemail for him to call office back in regards to medications

## 2022-04-30 ENCOUNTER — Telehealth: Payer: Self-pay | Admitting: Cardiovascular Disease

## 2022-04-30 NOTE — Telephone Encounter (Signed)
Called and spoke with patient. All questions answered. Pt requested to have f/u appt scheduled after echo, appt scheduled for 9/7 with Kathlen Mody, [place]

## 2022-04-30 NOTE — Telephone Encounter (Signed)
Patient calling in with questions about getting a stint. Please advise

## 2022-05-05 ENCOUNTER — Telehealth: Payer: Self-pay | Admitting: Internal Medicine

## 2022-05-05 NOTE — Telephone Encounter (Signed)
Spoke to patient. He stated that he is returning a call from 04/21/2022. According to 04/21/2022 phone note, patient returned call and this matter has been addressed.  Nothing further needed.

## 2022-05-21 ENCOUNTER — Ambulatory Visit (INDEPENDENT_AMBULATORY_CARE_PROVIDER_SITE_OTHER)

## 2022-05-21 DIAGNOSIS — R0602 Shortness of breath: Secondary | ICD-10-CM | POA: Diagnosis not present

## 2022-05-21 DIAGNOSIS — I209 Angina pectoris, unspecified: Secondary | ICD-10-CM

## 2022-05-21 LAB — ECHOCARDIOGRAM COMPLETE
AR max vel: 2.18 cm2
AV Area VTI: 2.36 cm2
AV Area mean vel: 2.56 cm2
AV Mean grad: 4 mmHg
AV Peak grad: 7.8 mmHg
Ao pk vel: 1.4 m/s
Area-P 1/2: 2.29 cm2

## 2022-05-22 ENCOUNTER — Telehealth: Payer: Self-pay | Admitting: Cardiovascular Disease

## 2022-05-22 NOTE — Telephone Encounter (Signed)
Pt just had ECHO yesterday - it has not been resulted by MD yet.

## 2022-05-22 NOTE — Telephone Encounter (Signed)
Follow Up:     Patient says he needs someone to call and explain his Echo results please. Mark Stevenson He said he saw the results, but did not understand it.

## 2022-05-23 NOTE — Telephone Encounter (Signed)
Pt is calling again. I explained that the results have not been resulted yet, but he would be updated when they were. He asked for a call back when available.

## 2022-05-26 ENCOUNTER — Telehealth: Payer: Self-pay | Admitting: *Deleted

## 2022-05-26 NOTE — Telephone Encounter (Signed)
Spoke with pt, notified of echo results per below. Pt proceeds to ask if the plan is still to have cath procedure.  Notified pt per CTA results 04/03/22:   "Given his shortness of breath/possible anginal equivalent, long smoking history, would consider cardiac catheterization Would recommend clinic appointment to review results and determine if catheterization indicated"  Pt currently scheduled to see Cadence Furth, PA-C 06/05/22.  Pt with continued symptoms. Offered APP appt tomorrow or Wednesday this week and pt refused both d/t unable to make either appt. Pt will keep follow up as scheduled. ER precautions provided to pt in the interim.  Pt voiced understanding and has no further questions at this time.

## 2022-05-26 NOTE — Telephone Encounter (Signed)
Please see subsequent telephone encounter from today. Spoke with pt and noted in today's note.

## 2022-05-26 NOTE — Telephone Encounter (Signed)
  Pt is calling back to get result

## 2022-05-26 NOTE — Telephone Encounter (Signed)
-----   Message from Minna Merritts, MD sent at 05/24/2022 10:17 PM EDT ----- Echo Normal LV and RV size and function No significant valve disease No indication of pulmonary HTN /high pressure that would cause shortness of breath

## 2022-05-27 ENCOUNTER — Telehealth: Payer: Self-pay | Admitting: Internal Medicine

## 2022-05-27 NOTE — Telephone Encounter (Signed)
Spoke to patient.  He is questioning what 06/05/2022 appt is for.  I have reviewed his chart, and it appears that this is a 20moROV.  He voiced his understanding and had no further questions.  Nothing further needed.

## 2022-05-28 ENCOUNTER — Ambulatory Visit: Payer: Medicare Other | Admitting: Physician Assistant

## 2022-05-28 NOTE — Progress Notes (Deleted)
Cardiology Office Note    Date:  05/28/2022   ID:  AMAZIAH RAISANEN, DOB [birth year], MRN [id number]  PCP:  Aurea Graff.Marlou Sa, MD  Cardiologist:  Ida Rogue, MD  Electrophysiologist:  None   Chief Complaint: Follow-up echo  History of Present Illness:   Mark Stevenson is a 66 y.o. male with history of ***  He was admitted to the hospital in 03/2022 with***   Echo on 05/21/2022 showed an EF of 55 to 60%, no regional wall motion abnormalities, mild LVH, grade 1 diastolic dysfunction, normal RV systolic function and ventricular cavity size, and aortic valve sclerosis without evidence of stenosis.  ***   Labs independently reviewed: ***  Past Medical History:  Diagnosis Date   Arthritis    Cancer ([place])    skin cancer bil knees   COPD (chronic obstructive pulmonary disease) (HCC)    Dyspnea    increased exertion; pt states can walk a flight of stairs w/o having to stop to catch breath    Hypertension    Nicotine dependence, cigarettes, uncomplicated    Peripheral neuropathy    Pure hypercholesterolemia    Sebaceous cyst    scrotal area    Viral wart     Past Surgical History:  Procedure Laterality Date   ankles     tarsal tunnel surgery bilat   BACK SURGERY  2008 and 10/22/16   herniated disc   broken knee cap  2002   calf surgery Bilateral    lengethen the tendons   COLONOSCOPY     crushed L-4 vertebrae  1980   KNEE ARTHROSCOPY     x3 bilat/ 2 times left knee   KYPHOPLASTY N/A 03/13/2021   Procedure: KYPHOPLASTY - T11 and L2;  Surgeon: Hessie Knows, MD;  Location: [facility];  Service: Orthopedics;  Laterality: N/A;   LEG SKIN LESION  BIOPSY / EXCISION     Bil knees   LUMBAR LAMINECTOMY/DECOMPRESSION MICRODISCECTOMY N/A 10/22/2016   Procedure: MICRO LUMBAR DECOMPRESSION L3-L4, L4-L5 REVISION  2 LEVELS;  Surgeon: Susa Day, MD;  Location: [facility];  Service: Orthopedics;  Laterality: N/A;  Requests 150 mins   PERIPHERAL VASCULAR CATHETERIZATION N/A  04/22/2016   Procedure: Abdominal Aortogram w/ bilateral Lower Extremity Runoff;  Surgeon: Serafina Mitchell, MD;  Location: [facility];  Service: Cardiovascular;  Laterality: N/A;   PERIPHERAL VASCULAR CATHETERIZATION Left 04/22/2016   Procedure: Peripheral Vascular Atherectomy;  Surgeon: Serafina Mitchell, MD;  Location: [facility] [facility];  Service: Cardiovascular;  Laterality: Left;   POLYPECTOMY     ruptured disc  2008   SHOULDER OPEN ROTATOR CUFF REPAIR Right 03/19/2017   Procedure: Mini open rotator cuff repair;  Surgeon: Susa Day, MD;  Location: [facility];  Service: Orthopedics;  Laterality: Right;  with block   SHOULDER SURGERY Right 2019   tendons in calves lengthened     plantar fascitis   TOTAL HIP ARTHROPLASTY  2012   left    Current Medications: No outpatient medications have been marked as taking for the 05/28/22 encounter (Appointment) with Rise Mu, PA-C.    Allergies:   Patient has no known allergies.   Social History   Socioeconomic History   Marital status: Divorced    Spouse name: Not on file   Number of children: Not on file   Years of education: Not on file   Highest education level: Not on file  Occupational History   Not on file  Tobacco Use  Smoking status: Some Days    Packs/day: 1.50    Years: 42.00    Total pack years: 63.00    Types: Cigarettes   Smokeless tobacco: Never   Tobacco comments:    12-15 Cig daily-03/07/2022  Vaping Use   Vaping Use: Never used  Substance and Sexual Activity   Alcohol use: Yes    Alcohol/week: 10.0 standard drinks of alcohol    Types: 10 Shots of liquor per week    Comment: 3 days per week vodka    Drug use: No   Sexual activity: Not on file  Other Topics Concern   Not on file  Social History Narrative   Not on file   Social Determinants of Health   Financial Resource Strain: Not on file  Food Insecurity: Not on file  Transportation Needs: Not on file  Physical Activity: Not on file  Stress:  Not on file  Social Connections: Not on file     Family History:  The patient's family history includes Colon cancer in his son; Liver cancer in his son; Lung cancer in his father. There is no history of Pancreatic cancer, Stomach cancer, Esophageal cancer, or Rectal cancer.  ROS:   ROS   EKGs/Labs/Other Studies Reviewed:    Studies reviewed were summarized above. The additional studies were reviewed today: ***  EKG:  EKG is ordered today.  The EKG ordered today demonstrates ***  Recent Labs: 04/07/2022: ALT 15 04/09/2022: Hemoglobin 11.6; Platelets 174 04/10/2022: Magnesium 2.4 04/13/2022: BUN 31; Creatinine, Ser 0.83; Potassium 3.5; Sodium 136  Recent Lipid Panel No results found for: "CHOL", "TRIG", "HDL", "CHOLHDL", "VLDL", "LDLCALC", "LDLDIRECT"  PHYSICAL EXAM:    VS:  There were no vitals taken for this visit.  BMI: There is no height or weight on file to calculate BMI.  Physical Exam  Wt Readings from Last 3 Encounters:  04/08/22 145 lb (65.8 kg)  03/31/22 137 lb 8 oz (62.4 kg)  03/07/22 145 lb (65.8 kg)     ASSESSMENT & PLAN:   ***   {Are you ordering a CV Procedure (e.g. stress test, cath, DCCV, TEE, etc)?   Press F2        :[id number]}     Disposition: F/u with Dr. Marland Kitchen or an APP in ***.   Medication Adjustments/Labs and Tests Ordered: Current medicines are reviewed at length with the patient today.  Concerns regarding medicines are outlined above. Medication changes, Labs and Tests ordered today are summarized above and listed in the Patient Instructions accessible in Encounters.   Signed, Christell Faith, PA-C 05/28/2022 11:37 AM     [address] [place] [address] [place], [place] [address] [phone]

## 2022-06-05 ENCOUNTER — Encounter: Payer: Self-pay | Admitting: Medical

## 2022-06-05 ENCOUNTER — Ambulatory Visit: Payer: Medicare Other | Attending: Medical | Admitting: Medical

## 2022-06-05 ENCOUNTER — Ambulatory Visit: Payer: Medicare Other | Admitting: Internal Medicine

## 2022-06-05 ENCOUNTER — Encounter: Payer: Self-pay | Admitting: Internal Medicine

## 2022-06-05 ENCOUNTER — Ambulatory Visit: Payer: Medicare Other | Admitting: Medical

## 2022-06-05 VITALS — BP 120/84 | HR 57 | Ht 71.5 in | Wt 141.0 lb

## 2022-06-05 DIAGNOSIS — I2510 Atherosclerotic heart disease of native coronary artery without angina pectoris: Secondary | ICD-10-CM

## 2022-06-05 DIAGNOSIS — J449 Chronic obstructive pulmonary disease, unspecified: Secondary | ICD-10-CM

## 2022-06-05 DIAGNOSIS — R627 Adult failure to thrive: Secondary | ICD-10-CM | POA: Diagnosis not present

## 2022-06-05 DIAGNOSIS — J9611 Chronic respiratory failure with hypoxia: Secondary | ICD-10-CM | POA: Diagnosis not present

## 2022-06-05 DIAGNOSIS — J441 Chronic obstructive pulmonary disease with (acute) exacerbation: Secondary | ICD-10-CM

## 2022-06-05 DIAGNOSIS — R0602 Shortness of breath: Secondary | ICD-10-CM | POA: Diagnosis not present

## 2022-06-05 MED ORDER — IPRATROPIUM-ALBUTEROL 0.5-2.5 (3) MG/3ML IN SOLN: 3.0000 mL | Freq: Four times a day (QID) | RESPIRATORY_TRACT | 11 refills | Status: AC

## 2022-06-05 NOTE — Assessment & Plan Note (Signed)
Active smoker - 12/26/2021   try trelegy if doesn't affect bladder function  - 03/07/2022  After extensive coaching inhaler device,  effectiveness =    75% with hfa > try change trelegy to breztri with pred x 12 day taper and f/u in 4 weeks  Now on hospice with limited formulary so best option is duoneb qid and prn saba neb as well   F/u can be prn now that on hospice

## 2022-06-05 NOTE — Assessment & Plan Note (Addendum)
Qualified 12/26/2021 :  desat to 87% RA slow pace x 175 ft then on 2lpm cont maint 96% still sob p 175 ft  - best fit 01/15/22  stayed above 90% on 3 Pulse walking but rec 3 -4 with higher levels of ex - 02/20/2022   Walked on 2lpm POC   x  2  lap(s) =  approx 350  ft  @ slow/rollator pace, stopped due to sob  with lowest 02 sats 99%  Again advised Make sure you check your oxygen saturation  AT  your highest level of activity (not after you stop)   to be sure it stays over 90% and adjust  02 flow upward to maintain this level if needed but remember to turn it back to previous settings when you stop           Each maintenance medication was reviewed in detail including emphasizing most importantly the difference between maintenance and prns and under what circumstances the prns are to be triggered using an action plan format where appropriate.  Total time for H and P, chart review, counseling, reviewing neb/02 device(s) and generating customized AVS unique to this office visit / same day charting  > 30 min summary final f/u ov multiple refractory resp symptoms

## 2022-06-05 NOTE — Patient Instructions (Addendum)
Plan A =Automatic/ Always : duoneb four times daily (ipatropium -albuterol)  Plan B  = Backup  Albuterol 2.5 mg per nebulizer up to very 4 hours as needed   Make sure you check your oxygen saturation  AT  your highest level of activity (not after you stop)   to be sure it stays over 90% and adjust  02 flow upward to maintain this level if needed but remember to turn it back to previous settings when you stop    Follow up here is needed

## 2022-06-05 NOTE — Patient Instructions (Signed)
Medication Instructions:   Your physician recommends that you continue on your current medications as directed. Please refer to the Current Medication list given to you today.  *If you need a refill on your cardiac medications before your next appointment, please call your pharmacy*   Lab Work:  None ordered   Testing/Procedures:  None ordered   Follow-Up: At [facility], you and your health needs are our priority.  As part of our continuing mission to provide you with exceptional heart care, we have created designated Provider Care Teams.  These Care Teams include your primary Cardiologist (physician) and Advanced Practice Providers (APPs -  Physician Assistants and Nurse Practitioners) who all work together to provide you with the care you need, when you need it.  We recommend signing up for the patient portal called "MyChart".  Sign up information is provided on this After Visit Summary.  MyChart is used to connect with patients for Virtual Visits (Telemedicine).  Patients are able to view lab/test results, encounter notes, upcoming appointments, etc.  Non-urgent messages can be sent to your provider as well.   To learn more about what you can do with MyChart, go to [email or web address].    Your next appointment:   1 month(s)  The format for your next appointment:   In Person  Provider:   Ida Rogue, MD    Important Information About Sugar

## 2022-06-05 NOTE — Progress Notes (Signed)
Cardiology Office Note:    Date:  06/05/2022   ID:  Mark Stevenson, DOB September 08, 1956, MRN [id number]  PCP:  Aurea Graff.Marlou Sa, MD  [facility] Cardiologist:  Ida Rogue, MD  [place] Electrophysiologist:  None   Referring MD: Alroy Dust, Carlean Jews.Marlou Sa, MD   Chief Complaint: Echo/possible cath  History of Present Illness:    Mark Stevenson is a 66 y.o. male with a hx of COPD, tobacco use, spinal stenosis, prior back surgery, alcohol abuse, HTN, urinary retention, CAD, FTT who presents for hospital follow-up.   The patient had a cardiac CTA for shortness of breath in July 2023. This showed coronary calcium score of 1928, 95th percentile for age and sex matched, calcified plaque in the pRCA causing severe stenosis, calcified plaque in the p-mid LAD causing moderate stenosis 50-69%. Plan was to discuss cardiac cath.   He presented to the ER July 10 for auditory hallucinations, but left before he could be seen. He came back July 11th with SOB and hallucinations, treated with duonebs and steroids. Labs showed Na 121 and WBC 14K. He was given hypertonic saline and placed on Bipap. It was felt mental status change was due to hyponatremia and hypercapnia. Due to COPD with poos functional capacity, hospice was discussed. Seen by cardiology and not felt to be a good cath candidate. He was sent home with hospice.  Echo 04/2022 showed normal LVEF 55-60%, no WMA, mild LVH, G1DD.  Today, the patient reports he is at home and not doing well. Mental status is back to normal. He used to be very active, but had a fall in 2022 and since then, functional status greatly decreased. Most of the time he lays in bed. Within the last year he has been started on O2, 2-3 L. He lives with an [place] caretaker. No chest pain. Breathing is unchanged. He vapes. No alcohol use. The patient is wanting a cardiac cath, however he is on hospice. I let him know we don't do aggressive procedures with patients on hospice.  He said he may get off hospice just to get the procedure done. It's a complicated situation. He is also no longer on cardiac medications. Review of palliative care note, expected 6 months to live.   Past Medical History:  Diagnosis Date   Arthritis    Cancer ([place])    skin cancer bil knees   COPD (chronic obstructive pulmonary disease) (HCC)    Dyspnea    increased exertion; pt states can walk a flight of stairs w/o having to stop to catch breath    Hypertension    Nicotine dependence, cigarettes, uncomplicated    Peripheral neuropathy    Pure hypercholesterolemia    Sebaceous cyst    scrotal area    Viral wart     Past Surgical History:  Procedure Laterality Date   ankles     tarsal tunnel surgery bilat   BACK SURGERY  2008 and 10/22/16   herniated disc   broken knee cap  2002   calf surgery Bilateral    lengethen the tendons   COLONOSCOPY     crushed L-4 vertebrae  1980   KNEE ARTHROSCOPY     x3 bilat/ 2 times left knee   KYPHOPLASTY N/A 03/13/2021   Procedure: KYPHOPLASTY - T11 and L2;  Surgeon: Hessie Knows, MD;  Location: [facility];  Service: Orthopedics;  Laterality: N/A;   LEG SKIN LESION  BIOPSY / EXCISION     Bil knees   LUMBAR LAMINECTOMY/DECOMPRESSION  MICRODISCECTOMY N/A 10/22/2016   Procedure: MICRO LUMBAR DECOMPRESSION L3-L4, L4-L5 REVISION  2 LEVELS;  Surgeon: Susa Day, MD;  Location: [facility];  Service: Orthopedics;  Laterality: N/A;  Requests 150 mins   PERIPHERAL VASCULAR CATHETERIZATION N/A 04/22/2016   Procedure: Abdominal Aortogram w/ bilateral Lower Extremity Runoff;  Surgeon: Serafina Mitchell, MD;  Location: [facility];  Service: Cardiovascular;  Laterality: N/A;   PERIPHERAL VASCULAR CATHETERIZATION Left 04/22/2016   Procedure: Peripheral Vascular Atherectomy;  Surgeon: Serafina Mitchell, MD;  Location: [place] [facility];  Service: Cardiovascular;  Laterality: Left;   POLYPECTOMY     ruptured disc  2008   SHOULDER OPEN ROTATOR CUFF REPAIR Right  03/19/2017   Procedure: Mini open rotator cuff repair;  Surgeon: Susa Day, MD;  Location: [facility];  Service: Orthopedics;  Laterality: Right;  with block   SHOULDER SURGERY Right 2019   tendons in calves lengthened     plantar fascitis   TOTAL HIP ARTHROPLASTY  2012   left    Current Medications: Current Meds  Medication Sig   albuterol (PROVENTIL) (2.5 MG/3ML) 0.083% nebulizer solution Take 3 mLs (2.5 mg total) by nebulization every 4 (four) hours as needed for wheezing or shortness of breath.   gabapentin (NEURONTIN) 600 MG tablet Take 600 mg by mouth 3 (three) times daily.   loratadine (CLARITIN) 10 MG tablet Take 10 mg by mouth daily as needed for allergies.   LORazepam (ATIVAN) 0.5 MG tablet Take 0.5 mg by mouth every 4 (four) hours as needed.   melatonin 5 MG TABS Take 5 mg by mouth at bedtime.   nicotine (NICODERM CQ - DOSED IN MG/24 HOURS) 21 mg/24hr patch Place 1 patch (21 mg total) onto the skin daily at 6 (six) AM.   pantoprazole (PROTONIX) 40 MG tablet Take 1 tablet (40 mg total) by mouth daily.   PROAIR HFA 108 (90 Base) MCG/ACT inhaler Inhale 2 puffs into the lungs every 6 (six) hours as needed for shortness of breath or wheezing.   triamcinolone cream (KENALOG) 0.1 % Apply 1 application topically daily as needed for rash.   VITAMIN D, CHOLECALCIFEROL, PO Take 2,000 Units by mouth daily.     Allergies:   Patient has no known allergies.   Social History   Socioeconomic History   Marital status: Divorced    Spouse name: Not on file   Number of children: Not on file   Years of education: Not on file   Highest education level: Not on file  Occupational History   Not on file  Tobacco Use   Smoking status: Former    Packs/day: 1.50    Years: 42.00    Total pack years: 63.00    Types: Cigarettes   Smokeless tobacco: Never   Tobacco comments:    12-15 Cig daily-03/07/2022  Vaping Use   Vaping Use: Every day  Substance and Sexual Activity   Alcohol use: Not  Currently    Alcohol/week: 10.0 standard drinks of alcohol    Types: 10 Shots of liquor per week    Comment: 3 days per week vodka    Drug use: No   Sexual activity: Not on file  Other Topics Concern   Not on file  Social History Narrative   Not on file   Social Determinants of Health   Financial Resource Strain: Not on file  Food Insecurity: Not on file  Transportation Needs: Not on file  Physical Activity: Not on file  Stress:  Not on file  Social Connections: Not on file     Family History: The patient's family history includes Colon cancer in his son; Liver cancer in his son; Lung cancer in his father. There is no history of Pancreatic cancer, Stomach cancer, Esophageal cancer, or Rectal cancer.  ROS:   Please see the history of present illness.     All other systems reviewed and are negative.  EKGs/Labs/Other Studies Reviewed:    The following studies were reviewed today:  Echo 04/2022  1. Left ventricular ejection fraction, by estimation, is 55 to 60%. The  left ventricle has normal function. The left ventricle has no regional  wall motion abnormalities. There is mild left ventricular hypertrophy.  Left ventricular diastolic parameters  are consistent with Grade I diastolic dysfunction (impaired relaxation).   2. Right ventricular systolic function is normal. The right ventricular  size is normal.   3. The mitral valve is normal in structure. No evidence of mitral valve  regurgitation.   4. The aortic valve was not well visualized. Aortic valve regurgitation  is not visualized. Aortic valve sclerosis/calcification is present,  without any evidence of aortic stenosis.   Coronary CTA 03/2022 IMPRESSION: 1. Coronary calcium score of 1928. This was 95th percentile for age and sex matched control.   2. Normal coronary origin with right dominance.   3. Calcified plaque in the proximal RCA causing severe stenosis (>70%).   4. Calcified plaque in the proximal-mid  LAD causing moderate stenosis (50-69%).   5. CAD-RADS 4 Severe stenosis. (70-99% or > 50% left main). Cardiac catheterization is recommended. Consider symptom-guided anti-ischemic pharmacotherapy as well as risk factor modification per guideline directed care.   Electronically Signed: By: Kate Sable M.D. On: 04/03/2022 15:28    EKG:  EKG is not ordered today.    Recent Labs: 04/07/2022: ALT 15 04/09/2022: Hemoglobin 11.6; Platelets 174 04/10/2022: Magnesium 2.4 04/13/2022: BUN 31; Creatinine, Ser 0.83; Potassium 3.5; Sodium 136  Recent Lipid Panel No results found for: "CHOL", "TRIG", "HDL", "CHOLHDL", "VLDL", "LDLCALC", "LDLDIRECT"   Physical Exam:    VS:  BP 120/84 (BP Location: Right Arm, Patient Position: Sitting, Cuff Size: Normal)   Pulse (!) 57   Ht 5' 11.5" (1.816 m)   Wt 141 lb (64 kg)   SpO2 99% Comment: not using O2 at this time  BMI 19.39 kg/m     Wt Readings from Last 3 Encounters:  06/05/22 141 lb (64 kg)  04/08/22 145 lb (65.8 kg)  03/31/22 137 lb 8 oz (62.4 kg)     GEN:  Well nourished, well developed in no acute distress HEENT: Normal NECK: No JVD; No carotid bruits LYMPHATICS: No lymphadenopathy CARDIAC: RRR, no murmurs, rubs, gallops RESPIRATORY:  diffusely diminished, rhonchii ABDOMEN: Soft, non-tender, non-distended MUSCULOSKELETAL:  No edema; No deformity  SKIN: Warm and dry NEUROLOGIC:  Alert and oriented x 3 PSYCHIATRIC:  Normal affect   ASSESSMENT:    1. Coronary artery disease involving native coronary artery of native heart without angina pectoris   2. SOB (shortness of breath)   3. COPD clinically severe/ still smoking   4. Failure to thrive in adult    PLAN:    In order of problems listed above:  CAD/SOB Coronary CTA showed coronary calcium score of 1928, 95th percentile for age and sex matched, pRCA >70% stenosis, moderate LAD stenosis, recommended cardiac cath. Since then he was hospitalized with AMS, hytponatremia,  COPD, respiratory failure with hypoxia and hypercapnia, failure to thrive. He  was seen by cardiology and not felt to be a good cath candidate. He was sent home with hospice. Overall doing poorly at home. He is sedentary at baseline, and rarely leaves the bed. He has no chest pain, chronic SOB on 2-3L O2. He is wanting to pursue cardiac cath to possibly improve his breathing. We discussed cardiac cath in detail. The patient is on hospice and we do not perform aggressive measures with hospice patients. The patient says he is willing to come off hospice for the procedure. Palliative care prognosis <78mo life expectancy. This will have to be a conversation between MD and patient given complex situation. The patient was not sent home on any cardiac medications.   COPD on 2L O2 COPD contributing to breathing issues. He sees pulmonary later today. He is vaping  FTT He is on hospice.   Disposition: Follow up in 1 month(s) with MD    Signed, Ory Elting HNinfa Meeker PA-C  06/05/2022 10:01 AM    [facility]

## 2022-06-05 NOTE — Progress Notes (Signed)
Mark Stevenson, male    DOB: 1956/02/25   MRN: [id number]   Brief patient profile:  33  yow  active smoker  referred to pulmonary clinic in [facility]  12/26/2021 by Dr Donnie Coffin for copd eval   Says out of rest home since  late fall of 2022 p prolonged admit [facility] (don't see this in care everywhere)    History of Present Illness  12/26/2021  Pulmonary/ 1st office eval/ Mark Stevenson / [employer] / still smoking maint on [employer] Complaint  Patient presents with   pulmonary consult    Hx of COPD. C/o sob with exertion.   Dyspnea:   PT with 02 drops and no 02 supplement  Cough: rattle > milky Sleep: on side / bed is flat  SABA use:  helps some Rec Plan A = Automatic = Always=    Trelegy 100 one each am  instead of [place] - change back if don't like trelegy for any reason Work on inhaler technique:    Plan B = Backup (to supplement plan A, not to replace it) Only use your albuterol inhaler as a rescue medication Plan C = Crisis (instead of Plan B but only if Plan B stops working) - only use your albuterol nebulizer if you first try [place] to try albuterol 15 min before an activity (on alternating days)  that you know would usually make you short of breath    Prednisone 10 mg take  4 each am x 2 days,   2 each am x 2 days,  1 each am x 2 days and stop   The key is to stop smoking completely before smoking completely stops you! Please schedule a follow up office visit in 6 weeks, call sooner if needed with all medications /inhalers/ solutions in hand   02/20/2022  f/u ov/Mark Stevenson/ [facility] re: copd  maint on trelegy  / still smoking Chief Complaint  Patient presents with   Follow-up    Patient feels like is breathing is not any better since last visit. States some days are good some are bad. Patient wears 2 liters oxygen all the time.    Dyspnea:  doing more but not checking  Cough: rattle esp in am slot yellow  Sleeping: flat bed on side  SABA use: saba 3-4  (up  from baseline) 02: 2lpm  Covid status: x 2   Rec Zpak Prednisone 10 mg take  4 each am x 2 days,   2 each am x 2 days,  1 each am x 2 days and stop  Refer to previous instructions = ABC plan The key is to stop smoking completely before smoking completely stops you!    03/07/2022  f/u ov/Mark Stevenson/ [facility] re: GOLD  group D   maint on trelegy  / still actively smoking  Chief Complaint  Patient presents with   Follow-up    Completed recent course of zpak. C/o sob with exertion, prod cough with yellow to light brown sputum and wheezing.   Dyspnea:  does PT 2 x per week walking around lap 2lpm and newstep up to 3lpm never higher, not measuring  Cough: smoker's rattle / worse in am/ no purulent sputum Sleeping: flat bed on side  SABA use: 3 -4 x per day, never prechallenges / rare neb  02: 2-3.5 does not titrate" hands too cold" ) Rec Plan A = Automatic = Always=    Breztri (instead of trelegy)  Take 2 puffs first thing in am and then another 2 puffs about 12 hours later.   Work on inhaler technique:    Plan B = Backup (to supplement plan A, not to replace it) Only use your albuterol inhaler as a rescue medication  Plan C = Crisis (instead of Plan B but only if Plan B stops working) - only use your albuterol nebulizer if you first try Plan B and it fails to help  Ok to [facility] to try albuterol 15 min before an activity (on alternating days)  that you know would usually make you short of breath Make sure you check your oxygen saturation  AT  your highest level of activity (not after you stop)   to be sure it stays over 90% Prednisone  10  take 4 for three days 3 for three days 2 for three days 1 for three days and stop   Keep prior appointment    06/05/2022  f/u ov/Mark Stevenson/ [facility] re: Group D copd on hospice now  / stopped smoking still vaping. Chief Complaint  Patient presents with   Follow-up  Dyspnea:  mostly house bound / house bound  Cough: congested/rattling/ still vaping   min mucoid production  Sleeping:  hob 30 degrees  SABA use: not sure what he's taking / hospice took him off breztri 02: 2lpm with sats in 90s     No obvious day to day or daytime variability or assoc excess/ purulent sputum or mucus plugs or hemoptysis or cp or chest tightness, subjective wheeze or overt sinus or hb symptoms.   Sleeping as above  without nocturnal  or early am exacerbation  of respiratory  c/o's or need for noct saba. Also denies any obvious fluctuation of symptoms with weather or environmental changes or other aggravating or alleviating factors except as outlined above   No unusual exposure hx or h/o childhood pna/ asthma or knowledge of premature birth.  Current Allergies, Complete Past Medical History, Past Surgical History, Family History, and Social History were reviewed in [employer] record.  ROS  The following are not active complaints unless bolded Hoarseness, sore throat, dysphagia, dental problems, itching, sneezing,  nasal congestion or discharge of excess mucus or purulent secretions, ear ache,   fever, chills, sweats, unintended wt loss or wt gain, classically pleuritic or exertional cp,  orthopnea pnd or arm/hand swelling  or leg swelling, presyncope, palpitations, abdominal pain, anorexia, nausea, vomiting, diarrhea  or change in bowel habits or change in bladder habits, change in stools or change in urine, dysuria, hematuria,  rash, arthralgias, visual complaints, headache, numbness, weakness or ataxia or problems with walking or coordination,  change in mood or  memory.        Current Meds - - NOTE:   Unable to verify as accurately reflecting what pt takes    Medication Sig   albuterol (PROVENTIL) (2.5 MG/3ML) 0.083% nebulizer solution Take 3 mLs (2.5 mg total) by nebulization every 4 (four) hours as needed for wheezing or shortness of breath.   gabapentin (NEURONTIN) 600 MG tablet Take 600 mg by mouth 3 (three) times daily.    loratadine (CLARITIN) 10 MG tablet Take 10 mg by mouth daily as needed for allergies.   LORazepam (ATIVAN) 0.5 MG tablet Take 0.5 mg by mouth every 4 (four) hours as needed.   melatonin 5 MG TABS Take 5 mg by mouth at bedtime.   nicotine (NICODERM CQ - DOSED IN MG/24 HOURS) 21 mg/24hr patch  Place 1 patch (21 mg total) onto the skin daily at 6 (six) AM.   pantoprazole (PROTONIX) 40 MG tablet Take 1 tablet (40 mg total) by mouth daily.   PROAIR HFA 108 (90 Base) MCG/ACT inhaler Inhale 2 puffs into the lungs every 6 (six) hours as needed for shortness of breath or wheezing.   VITAMIN D, CHOLECALCIFEROL, PO Take 2,000 Units by mouth daily.                   Past Medical History:  Diagnosis Date   Arthritis    Cancer ([place])    skin cancer bil knees   COPD (chronic obstructive pulmonary disease) (HCC)    Dyspnea    increased exertion; pt states can walk a flight of stairs w/o having to stop to catch breath    Hypertension    Nicotine dependence, cigarettes, uncomplicated    Peripheral neuropathy    Pure hypercholesterolemia    Sebaceous cyst    scrotal area    Viral wart        Objective:    06/05/2022         145  03/07/2022         145   02/20/22 141 lb (64 kg)  01/10/22 170 lb (77.1 kg)  03/11/21 170 lb (77.1 kg)      Vital signs reviewed  06/05/2022  - Note at rest 02 sats  95% on 2lpm    General appearance:   w/c bound very frail somber wm nad      HEENT :  Oropharynx  clear      NECK :  without JVD/Nodes/TM/ nl carotid upstrokes bilaterally   LUNGS: no acc muscle use,  Mod barrel  contour chest wall with bilateral distant isnp /exp rhonchi s  true wheeze and  without cough on insp or exp maneuvers and mod  Hyperresonant  to  percussion bilaterally     CV:  RRR  no s3 or murmur or increase in P2, and no edema   ABD:  soft and nontender with pos mid insp Hoover's  in the supine position. No bruits or organomegaly appreciated, bowel sounds nl  MS:   Ext warm without  deformities or   obvious joint restrictions , calf tenderness, cyanosis or clubbing  SKIN: warm and dry without lesions    NEURO:  alert, approp, nl sensorium with  no motor or cerebellar deficits apparent.                 I personally reviewed images and agree with radiology impression as follows:   Chest CT cuts on coronoary study 04/03/22  Diffuse bronchial wall thickening with emphysematous change. Focal pleuroparenchymal scarring along the anterior right middle lobe on image 51/11. Within the visualized portions of the thorax there are no suspicious appearing pulmonary nodules or masses, there is no acute consolidative airspace disease, no pleural effusions and no pneumothorax    Assessment

## 2022-06-09 DIAGNOSIS — N390 Urinary tract infection, site not specified: Secondary | ICD-10-CM | POA: Diagnosis not present

## 2022-06-10 ENCOUNTER — Telehealth: Payer: Self-pay | Admitting: Medical

## 2022-06-10 NOTE — Telephone Encounter (Signed)
Forwarding to Cadence, PA/ her nurse to review.  However, Dr. Rockey Situ had sent a note to triage yesterday based on the OV note he received from Cadence, which I fowarded to the patient through his MyChart as below:  I was unaware that he was on hospice, he did not mention anything and I did not see it in the notes.  He needs to talk with his hospice nurse and find out what his options are  Best option for him may be hospice that provides lots of care at home without which she may not do as well  There is no guarantee that doing a heart catheterization would lead to any benefit, sometimes there is risk with the procedure such as risk of stroke or heart attack  Often times a stent cannot be placed as an vision given location or degree of blockage  If after talking with his hospice group he would like to pursue catheterization, I can talk with the interventional cardiologist.  They previously felt he was not a good candidate for catheterization when he was in the hospital on a prior evaluation  Thx  TGollan

## 2022-06-10 NOTE — Telephone Encounter (Signed)
Mark Stevenson, from [place] called wanting to speak to Mark Stevenson.  She said she spoke with the hospice physician, he said if patient would like to have the stent done he would have to revoke his hospice care, patient is aware of that.  Hospice physician thinks it may him.  Patient is willing to take the risk.  If the doctor feels he would be a good candidate for the surgery then it's okay to doprocedure if patient wants to do so.

## 2022-06-10 NOTE — Telephone Encounter (Signed)
Kathlen Mody, Cadence H, PA-C  Emily Filbert, RN; Rockey Situ, Kathlene November, MD 1 hour ago (10:28 AM)    Thank you for the update.  Yes, this is a confusing situation. When I spoke to the patient on the phone last week he said he would come off hospice for the procedure. I said I would leave the final decision to pursue heart cath up to Dr. Rockey Situ. Patient has follow-up with Dr. Rockey Situ.

## 2022-06-11 NOTE — Telephone Encounter (Signed)
Reviewed with home health nurse. She states that if he should pursue the cath this would be considered aggressive management and would need to sign forms to revoke his Hospice. Advised that when I spoke with him earlier he said his wishes are to discuss in more detail at upcoming appointment. Patient did request she attend the appointment and she will try to come in if her schedule allows. She was appreciative for the review and further plan with their services. Instructed her to call back if she should have any further questions.

## 2022-06-11 NOTE — Telephone Encounter (Signed)
I am happy to do.  However, my understanding is to proceed with aggressive procedure like cardiac catheterization, his hospice status might be revoked.

## 2022-06-11 NOTE — Telephone Encounter (Signed)
No answer/Voicemail box is full.  

## 2022-06-11 NOTE — Telephone Encounter (Signed)
Called and spoke with patient. He wants to wait on scheduling this procedure to discuss with Dr. Rockey Situ at next appointment 06/24/22. Patient reports he has someone coming with him to discuss his plan of care. Confirmed appointment with him and he had no further questions at this time.

## 2022-06-11 NOTE — Telephone Encounter (Signed)
Minna Merritts, MD  Sent: Wed June 11, 2022 11:45 AM  To: [facility] Triage; Wellington Hampshire, MD   Message  Can we schedule this gentleman for cardiac catheterization with M. Fletcher Anon (who has seen him before in the hospital).  Patient is determined to have catheterization  Stenosis on cardiac CTA  He will need to make his own decisions with hospice  Recently seen in the office September 7 with Cadence, so probably does not need to come in for office visit to get directions.  We could call with catheterization directions.  Need to confirm updated labs  Thx  TGollan

## 2022-06-18 NOTE — Telephone Encounter (Signed)
Patient was spoken to last week and expressed his wishes to come into the office for further discussion with provider as documented below. Will keep patients apt for next week.

## 2022-06-20 DIAGNOSIS — R339 Retention of urine, unspecified: Secondary | ICD-10-CM | POA: Diagnosis not present

## 2022-06-23 NOTE — H&P (View-Only) (Signed)
Cardiology Office Note  Date:  06/24/2022   ID:  Mark Stevenson, DOB [birth year], MRN [id number]  PCP:  Aurea Graff.Marlou Sa, MD   Chief Complaint  Patient presents with   1 month follow up     Patient c/o chest pain.  Medications reviewed by the patient verbally.     HPI:  [name] is a 66 year old gentleman with past medical history of COPD, long history of smoking, now on vapor cigarette  Spinal stenosis, prior back surgery Alcohol abuse, hospital notes June 2022 " drinks 1-1/2 gallon every week of vodka" Hypertension Urine retention, does self cath Presenting for routine follow-up of his shortness of breath, Mark Stevenson artery disease  Last seen in clinic by myself in July 2023  Cardiac CTA April 03, 2022 Coronary calcium score of 1928. This was 95th percentile for age and sex matched control. Normal coronary origin with right dominance. Calcified plaque in the proximal RCA causing severe stenosis (>70%). Calcified plaque in the proximal-mid LAD causing moderate stenosis (50-69%).  Seen by one of our providers June 05, 2022 He has been on hospice He has indicated his desire to come off hospice to have cardiac catheterization  Pain around zyphoid when bending over On chronic oxygen, limited mobility Hoping coronary intervention might help his breathing and energy  Chronic leg weakness, walks with a walker  Fall 6/22: Hospitalization records reviewed Rhabdo, found down,  long recovery Legs weak, presents in wheelchair on today's visit  On oxygen 2.5 liters  EKG personally reviewed by myself on todays visit Nsr rate 79, bpm, nonspecific ST abnormality, baseline artifact  PMH:   has a past medical history of Arthritis, Cancer ([place]), COPD (chronic obstructive pulmonary disease) ([place]), Dyspnea, Hypertension, Nicotine dependence, cigarettes, uncomplicated, Peripheral neuropathy, Pure hypercholesterolemia, Sebaceous cyst, and Viral wart.  PSH:    Past Surgical  History:  Procedure Laterality Date   ankles     tarsal tunnel surgery bilat   BACK SURGERY  2008 and 10/22/16   herniated disc   broken knee cap  2002   calf surgery Bilateral    lengethen the tendons   COLONOSCOPY     crushed L-4 vertebrae  1980   KNEE ARTHROSCOPY     x3 bilat/ 2 times left knee   KYPHOPLASTY N/A 03/13/2021   Procedure: KYPHOPLASTY - T11 and L2;  Surgeon: Hessie Knows, MD;  Location: [facility] ORS;  Service: Orthopedics;  Laterality: N/A;   LEG SKIN LESION  BIOPSY / EXCISION     Bil knees   LUMBAR LAMINECTOMY/DECOMPRESSION MICRODISCECTOMY N/A 10/22/2016   Procedure: MICRO LUMBAR DECOMPRESSION L3-L4, L4-L5 REVISION  2 LEVELS;  Surgeon: Susa Day, MD;  Location: [facility];  Service: Orthopedics;  Laterality: N/A;  Requests 150 mins   PERIPHERAL VASCULAR CATHETERIZATION N/A 04/22/2016   Procedure: Abdominal Aortogram w/ bilateral Lower Extremity Runoff;  Surgeon: Serafina Mitchell, MD;  Location: [facility];  Service: Cardiovascular;  Laterality: N/A;   PERIPHERAL VASCULAR CATHETERIZATION Left 04/22/2016   Procedure: Peripheral Vascular Atherectomy;  Surgeon: Serafina Mitchell, MD;  Location: [facility];  Service: Cardiovascular;  Laterality: Left;   POLYPECTOMY     ruptured disc  2008   SHOULDER OPEN ROTATOR CUFF REPAIR Right 03/19/2017   Procedure: Mini open rotator cuff repair;  Surgeon: Susa Day, MD;  Location: [facility];  Service: Orthopedics;  Laterality: Right;  with block   SHOULDER SURGERY Right 2019   tendons in calves lengthened     plantar fascitis  TOTAL HIP ARTHROPLASTY  2012   left    Current Outpatient Medications  Medication Sig Dispense Refill   albuterol (PROVENTIL) (2.5 MG/3ML) 0.083% nebulizer solution Take 3 mLs (2.5 mg total) by nebulization every 4 (four) hours as needed for wheezing or shortness of breath. 75 mL 12   gabapentin (NEURONTIN) 600 MG tablet Take 600 mg by mouth 3 (three) times daily.     HYDROcodone-Acetaminophen  7.5-300 MG TABS Take by mouth.     ipratropium-albuterol (DUONEB) 0.5-2.5 (3) MG/3ML SOLN Take 3 mLs by nebulization 4 (four) times daily. 360 mL 11   loratadine (CLARITIN) 10 MG tablet Take 10 mg by mouth daily as needed for allergies.     LORazepam (ATIVAN) 0.5 MG tablet Take 0.5 mg by mouth every 4 (four) hours as needed.     losartan (COZAAR) 100 MG tablet Take 100 mg by mouth daily.     melatonin 5 MG TABS Take 5 mg by mouth at bedtime.     nicotine (NICODERM CQ - DOSED IN MG/24 HOURS) 21 mg/24hr patch Place 1 patch (21 mg total) onto the skin daily at 6 (six) AM. 28 patch 0   pantoprazole (PROTONIX) 40 MG tablet Take 1 tablet (40 mg total) by mouth daily. 30 tablet 0   temazepam (RESTORIL) 7.5 MG capsule Take 7.5 mg by mouth at bedtime as needed.     triamcinolone cream (KENALOG) 0.1 % Apply 1 application  topically daily as needed for rash.     VITAMIN D, CHOLECALCIFEROL, PO Take 2,000 Units by mouth daily.     No current facility-administered medications for this visit.     Allergies:   Patient has no known allergies.   Social History:  The patient  reports that he quit smoking about 3 months ago. His smoking use included cigarettes. He has a 63.00 pack-year smoking history. He has never used smokeless tobacco. He reports that he does not currently use alcohol after a past usage of about 10.0 standard drinks of alcohol per week. He reports that he does not use drugs.   Family History:   family history includes Colon cancer in his son; Liver cancer in his son; Lung cancer in his father.    Review of Systems: Review of Systems  Constitutional: Negative.   HENT: Negative.    Respiratory:  Positive for cough and shortness of breath.   Cardiovascular: Negative.   Gastrointestinal: Negative.   Musculoskeletal: Negative.   Neurological: Negative.   Psychiatric/Behavioral: Negative.    All other systems reviewed and are negative.   PHYSICAL EXAM: VS:  BP 130/70 (BP Location: Left  Arm, Patient Position: Sitting, Cuff Size: Normal)   Pulse 79   Ht 5' 11.5" (1.816 m)   Wt 150 lb 8 oz (68.3 kg)   BMI 20.70 kg/m  , BMI Body mass index is 20.7 kg/m. Constitutional:  oriented to person, place, and time. No distress.  HENT:  Head: Grossly normal Eyes:  no discharge. No scleral icterus.  Neck: No JVD, no carotid bruits  Cardiovascular: Regular rate and rhythm, no murmurs appreciated Pulmonary/Chest: Decreased breath sounds bilaterally, coarse Abdominal: Soft.  no distension.  no tenderness.  Musculoskeletal: Normal range of motion Neurological:  normal muscle tone. Coordination normal. No atrophy Skin: Skin warm and dry Psychiatric: normal affect, pleasant   Recent Labs: 04/07/2022: ALT 15 04/09/2022: Hemoglobin 11.6; Platelets 174 04/10/2022: Magnesium 2.4 04/13/2022: BUN 31; Creatinine, Ser 0.83; Potassium 3.5; Sodium 136    Lipid Panel No results  found for: "CHOL", "HDL", "LDLCALC", "TRIG"    Wt Readings from Last 3 Encounters:  06/24/22 150 lb 8 oz (68.3 kg)  06/05/22 145 lb 3.2 oz (65.9 kg)  06/05/22 141 lb (64 kg)     ASSESSMENT AND PLAN:  Problem List Items Addressed This Visit       Cardiology Problems   Hypertension   Relevant Medications   losartan (COZAAR) 100 MG tablet     Other   SOB (shortness of breath)   Relevant Orders   EKG 12-Lead   Other Visit Diagnoses     Coronary artery disease of native artery of native heart with stable angina pectoris ([place])    -  Primary   Relevant Medications   HYDROcodone-Acetaminophen 7.5-300 MG TABS   losartan (COZAAR) 100 MG tablet   Other Relevant Orders   EKG 12-Lead   COPD clinically severe/ still smoking       Relevant Orders   EKG 12-Lead   PAD (peripheral artery disease) (HCC)       Relevant Medications   losartan (COZAAR) 100 MG tablet     Coronary artery disease with stable angina Normal ejection fraction on echo Cardiac CTA with severe disease of RCA, moderate to severe disease  of LAD He would like to proceed with cardiac catheterization He is willing to sign himself off hospice to have catheterization At his request we will schedule catheterization in the hospital with Dr. Fletcher Anon who is aware of the patient I have reviewed the risks, indications, and alternatives to cardiac catheterization, possible angioplasty, and stenting with the patient. Risks include but are not limited to bleeding, infection, vascular injury, stroke, myocardial infection, arrhythmia, kidney injury, radiation-related injury in the case of prolonged fluoroscopy use, emergency cardiac surgery, and death. The patient understands the risks of serious complication is 1-2 in 0981 with diagnostic cardiac cath and 1-2% or less with angioplasty/stenting.  He is willing to proceed  Smoker/COPD Followed by pulmonary On chronic oxygen, smoking cessation recommended  Alcohol abuse Cessation recommended  Essential hypertension Blood pressure is well controlled on today's visit. No changes made to the medications.   Long discussion concerning cardiac catheterization procedure, risk and benefit  Total encounter time more than 40 minutes  Greater than 50% was spent in counseling and coordination of care with the patient    Signed, Esmond Plants, M.D., Ph.D. [place], [place]

## 2022-06-23 NOTE — Progress Notes (Unsigned)
Cardiology Office Note  Date:  06/24/2022   ID:  Mark Stevenson, DOB [birth year], MRN [id number]  PCP:  Mark Graff.Marlou Sa, MD   Chief Complaint  Patient presents with   1 month follow up     Patient c/o chest pain.  Medications reviewed by the patient verbally.     HPI:  [name] is a 67 year old gentleman with past medical history of COPD, long history of smoking, now on vapor cigarette  Spinal stenosis, prior back surgery Alcohol abuse, hospital notes June 2022 " drinks 1-1/2 gallon every week of vodka" Hypertension Urine retention, does self cath Presenting for routine follow-up of his shortness of breath, Neri artery disease  Last seen in clinic by myself in July 2023  Cardiac CTA April 03, 2022 Coronary calcium score of 1928. This was 95th percentile for age and sex matched control. Normal coronary origin with right dominance. Calcified plaque in the proximal RCA causing severe stenosis (>70%). Calcified plaque in the proximal-mid LAD causing moderate stenosis (50-69%).  Seen by one of our providers June 05, 2022 He has been on hospice He has indicated his desire to come off hospice to have cardiac catheterization  Pain around zyphoid when bending over On chronic oxygen, limited mobility Hoping coronary intervention might help his breathing and energy  Chronic leg weakness, walks with a walker  Fall 6/22: Hospitalization records reviewed Rhabdo, found down,  long recovery Legs weak, presents in wheelchair on today's visit  On oxygen 2.5 liters  EKG personally reviewed by myself on todays visit Nsr rate 79, bpm, nonspecific ST abnormality, baseline artifact  PMH:   has a past medical history of Arthritis, Cancer ([place]), COPD (chronic obstructive pulmonary disease) ([place]), Dyspnea, Hypertension, Nicotine dependence, cigarettes, uncomplicated, Peripheral neuropathy, Pure hypercholesterolemia, Sebaceous cyst, and Viral wart.  PSH:    Past Surgical  History:  Procedure Laterality Date   ankles     tarsal tunnel surgery bilat   BACK SURGERY  2008 and 10/22/16   herniated disc   broken knee cap  2002   calf surgery Bilateral    lengethen the tendons   COLONOSCOPY     crushed L-4 vertebrae  1980   KNEE ARTHROSCOPY     x3 bilat/ 2 times left knee   KYPHOPLASTY N/A 03/13/2021   Procedure: KYPHOPLASTY - T11 and L2;  Surgeon: Hessie Knows, MD;  Location: [facility]S;  Service: Orthopedics;  Laterality: N/A;   LEG SKIN LESION  BIOPSY / EXCISION     Bil knees   LUMBAR LAMINECTOMY/DECOMPRESSION MICRODISCECTOMY N/A 10/22/2016   Procedure: MICRO LUMBAR DECOMPRESSION L3-L4, L4-L5 REVISION  2 LEVELS;  Surgeon: Susa Day, MD;  Location: [facility];  Service: Orthopedics;  Laterality: N/A;  Requests 150 mins   PERIPHERAL VASCULAR CATHETERIZATION N/A 04/22/2016   Procedure: Abdominal Aortogram w/ bilateral Lower Extremity Runoff;  Surgeon: Serafina Mitchell, MD;  Location: [facility];  Service: Cardiovascular;  Laterality: N/A;   PERIPHERAL VASCULAR CATHETERIZATION Left 04/22/2016   Procedure: Peripheral Vascular Atherectomy;  Surgeon: Serafina Mitchell, MD;  Location: [facility];  Service: Cardiovascular;  Laterality: Left;   POLYPECTOMY     ruptured disc  2008   SHOULDER OPEN ROTATOR CUFF REPAIR Right 03/19/2017   Procedure: Mini open rotator cuff repair;  Surgeon: Susa Day, MD;  Location: [facility];  Service: Orthopedics;  Laterality: Right;  with block   SHOULDER SURGERY Right 2019   tendons in calves lengthened     plantar fascitis  TOTAL HIP ARTHROPLASTY  2012   left    Current Outpatient Medications  Medication Sig Dispense Refill   albuterol (PROVENTIL) (2.5 MG/3ML) 0.083% nebulizer solution Take 3 mLs (2.5 mg total) by nebulization every 4 (four) hours as needed for wheezing or shortness of breath. 75 mL 12   gabapentin (NEURONTIN) 600 MG tablet Take 600 mg by mouth 3 (three) times daily.     HYDROcodone-Acetaminophen  7.5-300 MG TABS Take by mouth.     ipratropium-albuterol (DUONEB) 0.5-2.5 (3) MG/3ML SOLN Take 3 mLs by nebulization 4 (four) times daily. 360 mL 11   loratadine (CLARITIN) 10 MG tablet Take 10 mg by mouth daily as needed for allergies.     LORazepam (ATIVAN) 0.5 MG tablet Take 0.5 mg by mouth every 4 (four) hours as needed.     losartan (COZAAR) 100 MG tablet Take 100 mg by mouth daily.     melatonin 5 MG TABS Take 5 mg by mouth at bedtime.     nicotine (NICODERM CQ - DOSED IN MG/24 HOURS) 21 mg/24hr patch Place 1 patch (21 mg total) onto the skin daily at 6 (six) AM. 28 patch 0   pantoprazole (PROTONIX) 40 MG tablet Take 1 tablet (40 mg total) by mouth daily. 30 tablet 0   temazepam (RESTORIL) 7.5 MG capsule Take 7.5 mg by mouth at bedtime as needed.     triamcinolone cream (KENALOG) 0.1 % Apply 1 application  topically daily as needed for rash.     VITAMIN D, CHOLECALCIFEROL, PO Take 2,000 Units by mouth daily.     No current facility-administered medications for this visit.     Allergies:   Patient has no known allergies.   Social History:  The patient  reports that he quit smoking about 3 months ago. His smoking use included cigarettes. He has a 63.00 pack-year smoking history. He has never used smokeless tobacco. He reports that he does not currently use alcohol after a past usage of about 10.0 standard drinks of alcohol per week. He reports that he does not use drugs.   Family History:   family history includes Colon cancer in his son; Liver cancer in his son; Lung cancer in his father.    Review of Systems: Review of Systems  Constitutional: Negative.   HENT: Negative.    Respiratory:  Positive for cough and shortness of breath.   Cardiovascular: Negative.   Gastrointestinal: Negative.   Musculoskeletal: Negative.   Neurological: Negative.   Psychiatric/Behavioral: Negative.    All other systems reviewed and are negative.   PHYSICAL EXAM: VS:  BP 130/70 (BP Location: Left  Arm, Patient Position: Sitting, Cuff Size: Normal)   Pulse 79   Ht 5' 11.5" (1.816 m)   Wt 150 lb 8 oz (68.3 kg)   BMI 20.70 kg/m  , BMI Body mass index is 20.7 kg/m. Constitutional:  oriented to person, place, and time. No distress.  HENT:  Head: Grossly normal Eyes:  no discharge. No scleral icterus.  Neck: No JVD, no carotid bruits  Cardiovascular: Regular rate and rhythm, no murmurs appreciated Pulmonary/Chest: Decreased breath sounds bilaterally, coarse Abdominal: Soft.  no distension.  no tenderness.  Musculoskeletal: Normal range of motion Neurological:  normal muscle tone. Coordination normal. No atrophy Skin: Skin warm and dry Psychiatric: normal affect, pleasant   Recent Labs: 04/07/2022: ALT 15 04/09/2022: Hemoglobin 11.6; Platelets 174 04/10/2022: Magnesium 2.4 04/13/2022: BUN 31; Creatinine, Ser 0.83; Potassium 3.5; Sodium 136    Lipid Panel No results  found for: "CHOL", "HDL", "LDLCALC", "TRIG"    Wt Readings from Last 3 Encounters:  06/24/22 150 lb 8 oz (68.3 kg)  06/05/22 145 lb 3.2 oz (65.9 kg)  06/05/22 141 lb (64 kg)     ASSESSMENT AND PLAN:  Problem List Items Addressed This Visit       Cardiology Problems   Hypertension   Relevant Medications   losartan (COZAAR) 100 MG tablet     Other   SOB (shortness of breath)   Relevant Orders   EKG 12-Lead   Other Visit Diagnoses     Coronary artery disease of native artery of native heart with stable angina pectoris ([place])    -  Primary   Relevant Medications   HYDROcodone-Acetaminophen 7.5-300 MG TABS   losartan (COZAAR) 100 MG tablet   Other Relevant Orders   EKG 12-Lead   COPD clinically severe/ still smoking       Relevant Orders   EKG 12-Lead   PAD (peripheral artery disease) (HCC)       Relevant Medications   losartan (COZAAR) 100 MG tablet     Coronary artery disease with stable angina Normal ejection fraction on echo Cardiac CTA with severe disease of RCA, moderate to severe disease  of LAD He would like to proceed with cardiac catheterization He is willing to sign himself off hospice to have catheterization At his request we will schedule catheterization in the hospital with Dr. Fletcher Anon who is aware of the patient I have reviewed the risks, indications, and alternatives to cardiac catheterization, possible angioplasty, and stenting with the patient. Risks include but are not limited to bleeding, infection, vascular injury, stroke, myocardial infection, arrhythmia, kidney injury, radiation-related injury in the case of prolonged fluoroscopy use, emergency cardiac surgery, and death. The patient understands the risks of serious complication is 1-2 in 5681 with diagnostic cardiac cath and 1-2% or less with angioplasty/stenting.  He is willing to proceed  Smoker/COPD Followed by pulmonary On chronic oxygen, smoking cessation recommended  Alcohol abuse Cessation recommended  Essential hypertension Blood pressure is well controlled on today's visit. No changes made to the medications.   Long discussion concerning cardiac catheterization procedure, risk and benefit  Total encounter time more than 40 minutes  Greater than 50% was spent in counseling and coordination of care with the patient    Signed, Esmond Plants, M.D., Ph.D. [place], [place]

## 2022-06-23 NOTE — H&P (View-Only) (Signed)
Cardiology Office Note  Date:  06/24/2022   ID:  CARR SHARTZER, DOB Oct 10, 1955, MRN [id number]  PCP:  Aurea Graff.Marlou Sa, MD   Chief Complaint  Patient presents with   1 month follow up     Patient c/o chest pain.  Medications reviewed by the patient verbally.     HPI:  Mr. Taejon Irani is a 66 year old gentleman with past medical history of COPD, long history of smoking, now on vapor cigarette  Spinal stenosis, prior back surgery Alcohol abuse, hospital notes June 2022 " drinks 1-1/2 gallon every week of vodka" Hypertension Urine retention, does self cath Presenting for routine follow-up of his shortness of breath, Neri artery disease  Last seen in clinic by myself in July 2023  Cardiac CTA April 03, 2022 Coronary calcium score of 1928. This was 95th percentile for age and sex matched control. Normal coronary origin with right dominance. Calcified plaque in the proximal RCA causing severe stenosis (>70%). Calcified plaque in the proximal-mid LAD causing moderate stenosis (50-69%).  Seen by one of our providers June 05, 2022 He has been on hospice He has indicated his desire to come off hospice to have cardiac catheterization  Pain around zyphoid when bending over On chronic oxygen, limited mobility Hoping coronary intervention might help his breathing and energy  Chronic leg weakness, walks with a walker  Fall 6/22: Hospitalization records reviewed Rhabdo, found down,  long recovery Legs weak, presents in wheelchair on today's visit  On oxygen 2.5 liters  EKG personally reviewed by myself on todays visit Nsr rate 79, bpm, nonspecific ST abnormality, baseline artifact  PMH:   has a past medical history of Arthritis, Cancer ([place]), COPD (chronic obstructive pulmonary disease) ([place]), Dyspnea, Hypertension, Nicotine dependence, cigarettes, uncomplicated, Peripheral neuropathy, Pure hypercholesterolemia, Sebaceous cyst, and Viral wart.  PSH:    Past Surgical  History:  Procedure Laterality Date   ankles     tarsal tunnel surgery bilat   BACK SURGERY  2008 and 10/22/16   herniated disc   broken knee cap  2002   calf surgery Bilateral    lengethen the tendons   COLONOSCOPY     crushed L-4 vertebrae  1980   KNEE ARTHROSCOPY     x3 bilat/ 2 times left knee   KYPHOPLASTY N/A 03/13/2021   Procedure: KYPHOPLASTY - T11 and L2;  Surgeon: Hessie Knows, MD;  Location: [facility];  Service: Orthopedics;  Laterality: N/A;   LEG SKIN LESION  BIOPSY / EXCISION     Bil knees   LUMBAR LAMINECTOMY/DECOMPRESSION MICRODISCECTOMY N/A 10/22/2016   Procedure: MICRO LUMBAR DECOMPRESSION L3-L4, L4-L5 REVISION  2 LEVELS;  Surgeon: Susa Day, MD;  Location: [facility];  Service: Orthopedics;  Laterality: N/A;  Requests 150 mins   PERIPHERAL VASCULAR CATHETERIZATION N/A 04/22/2016   Procedure: Abdominal Aortogram w/ bilateral Lower Extremity Runoff;  Surgeon: Serafina Mitchell, MD;  Location: [facility];  Service: Cardiovascular;  Laterality: N/A;   PERIPHERAL VASCULAR CATHETERIZATION Left 04/22/2016   Procedure: Peripheral Vascular Atherectomy;  Surgeon: Serafina Mitchell, MD;  Location: [facility];  Service: Cardiovascular;  Laterality: Left;   POLYPECTOMY     ruptured disc  2008   SHOULDER OPEN ROTATOR CUFF REPAIR Right 03/19/2017   Procedure: Mini open rotator cuff repair;  Surgeon: Susa Day, MD;  [facility];  Service: Orthopedics;  Laterality: Right;  with block   SHOULDER SURGERY Right 2019   tendons in calves lengthened     plantar fascitis  TOTAL HIP ARTHROPLASTY  2012   left    Current Outpatient Medications  Medication Sig Dispense Refill   albuterol (PROVENTIL) (2.5 MG/3ML) 0.083% nebulizer solution Take 3 mLs (2.5 mg total) by nebulization every 4 (four) hours as needed for wheezing or shortness of breath. 75 mL 12   gabapentin (NEURONTIN) 600 MG tablet Take 600 mg by mouth 3 (three) times daily.     HYDROcodone-Acetaminophen  7.5-300 MG TABS Take by mouth.     ipratropium-albuterol (DUONEB) 0.5-2.5 (3) MG/3ML SOLN Take 3 mLs by nebulization 4 (four) times daily. 360 mL 11   loratadine (CLARITIN) 10 MG tablet Take 10 mg by mouth daily as needed for allergies.     LORazepam (ATIVAN) 0.5 MG tablet Take 0.5 mg by mouth every 4 (four) hours as needed.     losartan (COZAAR) 100 MG tablet Take 100 mg by mouth daily.     melatonin 5 MG TABS Take 5 mg by mouth at bedtime.     nicotine (NICODERM CQ - DOSED IN MG/24 HOURS) 21 mg/24hr patch Place 1 patch (21 mg total) onto the skin daily at 6 (six) AM. 28 patch 0   pantoprazole (PROTONIX) 40 MG tablet Take 1 tablet (40 mg total) by mouth daily. 30 tablet 0   temazepam (RESTORIL) 7.5 MG capsule Take 7.5 mg by mouth at bedtime as needed.     triamcinolone cream (KENALOG) 0.1 % Apply 1 application  topically daily as needed for rash.     VITAMIN D, CHOLECALCIFEROL, PO Take 2,000 Units by mouth daily.     No current facility-administered medications for this visit.     Allergies:   Patient has no known allergies.   Social History:  The patient  reports that he quit smoking about 3 months ago. His smoking use included cigarettes. He has a 63.00 pack-year smoking history. He has never used smokeless tobacco. He reports that he does not currently use alcohol after a past usage of about 10.0 standard drinks of alcohol per week. He reports that he does not use drugs.   Family History:   family history includes Colon cancer in his son; Liver cancer in his son; Lung cancer in his father.    Review of Systems: Review of Systems  Constitutional: Negative.   HENT: Negative.    Respiratory:  Positive for cough and shortness of breath.   Cardiovascular: Negative.   Gastrointestinal: Negative.   Musculoskeletal: Negative.   Neurological: Negative.   Psychiatric/Behavioral: Negative.    All other systems reviewed and are negative.   PHYSICAL EXAM: VS:  BP 130/70 (BP Location: Left  Arm, Patient Position: Sitting, Cuff Size: Normal)   Pulse 79   Ht 5' 11.5" (1.816 m)   Wt 150 lb 8 oz (68.3 kg)   BMI 20.70 kg/m  , BMI Body mass index is 20.7 kg/m. Constitutional:  oriented to person, place, and time. No distress.  HENT:  Head: Grossly normal Eyes:  no discharge. No scleral icterus.  Neck: No JVD, no carotid bruits  Cardiovascular: Regular rate and rhythm, no murmurs appreciated Pulmonary/Chest: Decreased breath sounds bilaterally, coarse Abdominal: Soft.  no distension.  no tenderness.  Musculoskeletal: Normal range of motion Neurological:  normal muscle tone. Coordination normal. No atrophy Skin: Skin warm and dry Psychiatric: normal affect, pleasant   Recent Labs: 04/07/2022: ALT 15 04/09/2022: Hemoglobin 11.6; Platelets 174 04/10/2022: Magnesium 2.4 04/13/2022: BUN 31; Creatinine, Ser 0.83; Potassium 3.5; Sodium 136    Lipid Panel No results  found for: "CHOL", "HDL", "LDLCALC", "TRIG"    Wt Readings from Last 3 Encounters:  06/24/22 150 lb 8 oz (68.3 kg)  06/05/22 145 lb 3.2 oz (65.9 kg)  06/05/22 141 lb (64 kg)     ASSESSMENT AND PLAN:  Problem List Items Addressed This Visit       Cardiology Problems   Hypertension   Relevant Medications   losartan (COZAAR) 100 MG tablet     Other   SOB (shortness of breath)   Relevant Orders   EKG 12-Lead   Other Visit Diagnoses     Coronary artery disease of native artery of native heart with stable angina pectoris ([place])    -  Primary   Relevant Medications   HYDROcodone-Acetaminophen 7.5-300 MG TABS   losartan (COZAAR) 100 MG tablet   Other Relevant Orders   EKG 12-Lead   COPD clinically severe/ still smoking       Relevant Orders   EKG 12-Lead   PAD (peripheral artery disease) (HCC)       Relevant Medications   losartan (COZAAR) 100 MG tablet     Coronary artery disease with stable angina Normal ejection fraction on echo Cardiac CTA with severe disease of RCA, moderate to severe disease  of LAD He would like to proceed with cardiac catheterization He is willing to sign himself off hospice to have catheterization At his request we will schedule catheterization in the hospital with Dr. Fletcher Anon who is aware of the patient I have reviewed the risks, indications, and alternatives to cardiac catheterization, possible angioplasty, and stenting with the patient. Risks include but are not limited to bleeding, infection, vascular injury, stroke, myocardial infection, arrhythmia, kidney injury, radiation-related injury in the case of prolonged fluoroscopy use, emergency cardiac surgery, and death. The patient understands the risks of serious complication is 1-2 in 1694 with diagnostic cardiac cath and 1-2% or less with angioplasty/stenting.  He is willing to proceed  Smoker/COPD Followed by pulmonary On chronic oxygen, smoking cessation recommended  Alcohol abuse Cessation recommended  Essential hypertension Blood pressure is well controlled on today's visit. No changes made to the medications.   Long discussion concerning cardiac catheterization procedure, risk and benefit  Total encounter time more than 40 minutes  Greater than 50% was spent in counseling and coordination of care with the patient    Signed, Esmond Plants, M.D., Ph.D. [place], [place]

## 2022-06-24 ENCOUNTER — Ambulatory Visit: Payer: Medicare Other | Attending: Cardiovascular Disease | Admitting: Cardiovascular Disease

## 2022-06-24 ENCOUNTER — Encounter: Payer: Self-pay | Admitting: Cardiovascular Disease

## 2022-06-24 VITALS — BP 130/70 | HR 79 | Ht 71.5 in | Wt 150.5 lb

## 2022-06-24 DIAGNOSIS — J449 Chronic obstructive pulmonary disease, unspecified: Secondary | ICD-10-CM | POA: Diagnosis not present

## 2022-06-24 DIAGNOSIS — I739 Peripheral vascular disease, unspecified: Secondary | ICD-10-CM

## 2022-06-24 DIAGNOSIS — R0602 Shortness of breath: Secondary | ICD-10-CM | POA: Diagnosis not present

## 2022-06-24 DIAGNOSIS — I25118 Atherosclerotic heart disease of native coronary artery with other forms of angina pectoris: Secondary | ICD-10-CM | POA: Diagnosis not present

## 2022-06-24 DIAGNOSIS — I10 Essential (primary) hypertension: Secondary | ICD-10-CM | POA: Diagnosis not present

## 2022-06-24 MED ORDER — ASPIRIN 81 MG PO TBEC: 81.0000 mg | DELAYED_RELEASE_TABLET | Freq: Every day | ORAL | 3 refills | Status: AC

## 2022-06-24 MED ORDER — BUDESONIDE 0.25 MG/2ML IN SUSP: 0.2500 mg | Freq: Every day | RESPIRATORY_TRACT | 12 refills | Status: AC

## 2022-06-24 NOTE — Patient Instructions (Addendum)
Medication Instructions:  Please start asa 81 mg daily  If you need a refill on your cardiac medications before your next appointment, please call your pharmacy.   Lab work: No new labs needed  Testing/Procedures: [facility] Cardiac Cath Instructions  You are scheduled for a Cardiac Cath on: Friday, 06/27/22 with Dr. Fletcher Anon Please arrive at ____9:30___am on the day of your procedure Please expect a call from our [facility] to pre-register you Do not eat/drink anything after midnight Someone will need to drive you home It is recommended someone be with you for the first 24 hours after your procedure Wear clothes that are easy to get on/off and wear slip on shoes if possible   Medications bring a current list of all medications with you  ___ You may take all of your medications the morning of your procedure with enough water to swallow safely   Day of your procedure: Arrive at the [facility] entrance.  Free valet service is available.  After entering the [facility] please check-in at the registration desk (1st desk on your right) to receive your armband. After receiving your armband someone will escort you to the cardiac cath/special procedures waiting area.  The usual length of stay after your procedure is about 2 to 3 hours.  This can vary.  If you have any questions, please call our office at [phone], or you may call the cardiac cath lab at [facility] directly at [phone]   Follow-Up: At [facility], you and your health needs are our priority.  As part of our continuing mission to provide you with exceptional heart care, we have created designated Provider Care Teams.  These Care Teams include your primary Cardiologist (physician) and Advanced Practice Providers (APPs -  Physician Assistants and Nurse Practitioners) who all work together to provide you with the care you need, when you need it.  You will need a follow up appointment in 6 months  Providers  on your designated Care Team:   Murray Hodgkins, NP Christell Faith, PA-C Cadence Kathlen Mody, [place]  COVID-19 Vaccine Information can be found at: [email or web address] For questions related to vaccine distribution or appointments, please email [email or web address] or call [phone].

## 2022-06-26 ENCOUNTER — Other Ambulatory Visit: Payer: Self-pay | Admitting: Cardiovascular Disease

## 2022-06-26 DIAGNOSIS — I200 Unstable angina: Secondary | ICD-10-CM

## 2022-06-27 ENCOUNTER — Encounter: Payer: Self-pay | Admitting: Cardiovascular Disease

## 2022-06-27 ENCOUNTER — Encounter
Admission: RE | Disposition: A | Discharge status: Home / Self Care | Payer: Self-pay | Source: Home / Self Care | Attending: Cardiovascular Disease

## 2022-06-27 ENCOUNTER — Ambulatory Visit
Admission: RE | Admit: 2022-06-27 | Discharge: 2022-06-27 | Disposition: A | Discharge status: Home / Self Care | Payer: Medicare Other | Attending: Cardiovascular Disease | Admitting: Cardiovascular Disease

## 2022-06-27 ENCOUNTER — Other Ambulatory Visit: Payer: Self-pay

## 2022-06-27 DIAGNOSIS — I25110 Atherosclerotic heart disease of native coronary artery with unstable angina pectoris: Secondary | ICD-10-CM

## 2022-06-27 DIAGNOSIS — R339 Retention of urine, unspecified: Secondary | ICD-10-CM | POA: Insufficient documentation

## 2022-06-27 DIAGNOSIS — Z9981 Dependence on supplemental oxygen: Secondary | ICD-10-CM | POA: Insufficient documentation

## 2022-06-27 DIAGNOSIS — I739 Peripheral vascular disease, unspecified: Secondary | ICD-10-CM | POA: Insufficient documentation

## 2022-06-27 DIAGNOSIS — I10 Essential (primary) hypertension: Secondary | ICD-10-CM | POA: Diagnosis not present

## 2022-06-27 DIAGNOSIS — J449 Chronic obstructive pulmonary disease, unspecified: Secondary | ICD-10-CM | POA: Insufficient documentation

## 2022-06-27 DIAGNOSIS — F17290 Nicotine dependence, other tobacco product, uncomplicated: Secondary | ICD-10-CM | POA: Diagnosis not present

## 2022-06-27 DIAGNOSIS — I200 Unstable angina: Secondary | ICD-10-CM

## 2022-06-27 DIAGNOSIS — I2584 Coronary atherosclerosis due to calcified coronary lesion: Secondary | ICD-10-CM | POA: Insufficient documentation

## 2022-06-27 DIAGNOSIS — F1010 Alcohol abuse, uncomplicated: Secondary | ICD-10-CM | POA: Diagnosis not present

## 2022-06-27 HISTORY — PX: LEFT HEART CATH AND CORONARY ANGIOGRAPHY: CATH118249

## 2022-06-27 HISTORY — PX: CORONARY STENT INTERVENTION: CATH118234

## 2022-06-27 LAB — POCT ACTIVATED CLOTTING TIME
Activated Clotting Time: 233 seconds
Activated Clotting Time: 281 seconds

## 2022-06-27 LAB — CBC
HCT: 38.2 % — ABNORMAL LOW (ref 39.0–52.0)
Hemoglobin: 11.7 g/dL — ABNORMAL LOW (ref 13.0–17.0)
MCH: 30.1 pg (ref 26.0–34.0)
MCHC: 30.6 g/dL (ref 30.0–36.0)
MCV: 98.2 fL (ref 80.0–100.0)
Platelets: 173 10*3/uL (ref 150–400)
RBC: 3.89 MIL/uL — ABNORMAL LOW (ref 4.22–5.81)
RDW: 13.4 % (ref 11.5–15.5)
WBC: 8.4 10*3/uL (ref 4.0–10.5)
nRBC: 0 % (ref 0.0–0.2)

## 2022-06-27 SURGERY — LEFT HEART CATH AND CORONARY ANGIOGRAPHY
Anesthesia: Moderate Sedation

## 2022-06-27 MED ORDER — CLOPIDOGREL BISULFATE 75 MG PO TABS
ORAL_TABLET | ORAL | Status: AC
  Filled 2022-06-27: qty 8

## 2022-06-27 MED ORDER — FENTANYL CITRATE (PF) 100 MCG/2ML IJ SOLN
INTRAMUSCULAR | Status: AC
  Filled 2022-06-27: qty 2

## 2022-06-27 MED ORDER — ASPIRIN 81 MG PO CHEW
CHEWABLE_TABLET | ORAL | Status: AC
  Filled 2022-06-27: qty 2

## 2022-06-27 MED ORDER — HEPARIN SODIUM (PORCINE) 1000 UNIT/ML IJ SOLN
INTRAMUSCULAR | Status: AC
  Filled 2022-06-27: qty 10

## 2022-06-27 MED ORDER — SODIUM CHLORIDE 0.9 % IV SOLN: 250.0000 mL | INTRAVENOUS | Status: DC | PRN

## 2022-06-27 MED ORDER — HEPARIN (PORCINE) IN NACL 1000-0.9 UT/500ML-% IV SOLN
INTRAVENOUS | Status: AC
  Filled 2022-06-27: qty 1000

## 2022-06-27 MED ORDER — ONDANSETRON HCL 4 MG/2ML IJ SOLN: 4.0000 mg | Freq: Four times a day (QID) | INTRAMUSCULAR | Status: DC | PRN

## 2022-06-27 MED ORDER — SODIUM CHLORIDE 0.9 % IV SOLN: INTRAVENOUS | Status: AC

## 2022-06-27 MED ORDER — IOHEXOL 300 MG/ML  SOLN
INTRAMUSCULAR | Status: DC | PRN
  Administered 2022-06-27: 127 mL

## 2022-06-27 MED ORDER — LIDOCAINE HCL 1 % IJ SOLN
INTRAMUSCULAR | Status: AC
  Filled 2022-06-27: qty 20

## 2022-06-27 MED ORDER — ACETAMINOPHEN 325 MG PO TABS: 650.0000 mg | ORAL_TABLET | ORAL | Status: DC | PRN

## 2022-06-27 MED ORDER — MIDAZOLAM HCL 2 MG/2ML IJ SOLN
INTRAMUSCULAR | Status: DC | PRN
  Administered 2022-06-27: 1 mg via INTRAVENOUS
  Administered 2022-06-27: .5 mg via INTRAVENOUS

## 2022-06-27 MED ORDER — NITROGLYCERIN 1 MG/10 ML FOR IR/CATH LAB
INTRA_ARTERIAL | Status: AC
  Filled 2022-06-27: qty 10

## 2022-06-27 MED ORDER — LORAZEPAM 0.5 MG PO TABS
0.5000 mg | ORAL_TABLET | ORAL | Status: DC | PRN
  Administered 2022-06-27: 0.5 mg via ORAL
  Filled 2022-06-27 (×2): qty 1

## 2022-06-27 MED ORDER — HEPARIN (PORCINE) IN NACL 1000-0.9 UT/500ML-% IV SOLN
INTRAVENOUS | Status: DC | PRN
  Administered 2022-06-27 (×3): 500 mL

## 2022-06-27 MED ORDER — ASPIRIN 81 MG PO CHEW: 81.0000 mg | CHEWABLE_TABLET | ORAL | Status: DC

## 2022-06-27 MED ORDER — SODIUM CHLORIDE 0.9% FLUSH: 3.0000 mL | Freq: Two times a day (BID) | INTRAVENOUS | Status: DC

## 2022-06-27 MED ORDER — CLOPIDOGREL BISULFATE 75 MG PO TABS: 75.0000 mg | ORAL_TABLET | Freq: Every day | ORAL | 6 refills | Status: AC

## 2022-06-27 MED ORDER — VERAPAMIL HCL 2.5 MG/ML IV SOLN
INTRAVENOUS | Status: DC | PRN
  Administered 2022-06-27: 2.5 mg via INTRA_ARTERIAL

## 2022-06-27 MED ORDER — CLOPIDOGREL BISULFATE 75 MG PO TABS
ORAL_TABLET | ORAL | Status: DC | PRN
  Administered 2022-06-27: 600 mg via ORAL

## 2022-06-27 MED ORDER — HEPARIN SODIUM (PORCINE) 1000 UNIT/ML IJ SOLN
INTRAMUSCULAR | Status: DC | PRN
  Administered 2022-06-27: 3000 [IU] via INTRAVENOUS
  Administered 2022-06-27 (×2): 3500 [IU] via INTRAVENOUS

## 2022-06-27 MED ORDER — VERAPAMIL HCL 2.5 MG/ML IV SOLN
INTRAVENOUS | Status: AC
  Filled 2022-06-27: qty 2

## 2022-06-27 MED ORDER — SODIUM CHLORIDE 0.9 % WEIGHT BASED INFUSION
3.0000 mL/kg/h | INTRAVENOUS | Status: AC
  Administered 2022-06-27: 3 mL/kg/h via INTRAVENOUS

## 2022-06-27 MED ORDER — DIPHENHYDRAMINE HCL 50 MG/ML IJ SOLN
INTRAMUSCULAR | Status: DC | PRN
  Administered 2022-06-27: 25 mg via INTRAVENOUS

## 2022-06-27 MED ORDER — LIDOCAINE HCL (PF) 1 % IJ SOLN
INTRAMUSCULAR | Status: DC | PRN
  Administered 2022-06-27: 2 mL

## 2022-06-27 MED ORDER — SODIUM CHLORIDE 0.9 % WEIGHT BASED INFUSION
1.0000 mL/kg/h | INTRAVENOUS | Status: DC
  Administered 2022-06-27: 1 mL/kg/h via INTRAVENOUS

## 2022-06-27 MED ORDER — MIDAZOLAM HCL 2 MG/2ML IJ SOLN
INTRAMUSCULAR | Status: AC
  Filled 2022-06-27: qty 2

## 2022-06-27 MED ORDER — ASPIRIN 81 MG PO CHEW
CHEWABLE_TABLET | ORAL | Status: DC | PRN
  Administered 2022-06-27: 162 mg via ORAL

## 2022-06-27 MED ORDER — SODIUM CHLORIDE 0.9% FLUSH: 3.0000 mL | INTRAVENOUS | Status: DC | PRN

## 2022-06-27 MED ORDER — DIPHENHYDRAMINE HCL 50 MG/ML IJ SOLN
INTRAMUSCULAR | Status: AC
  Filled 2022-06-27: qty 1

## 2022-06-27 MED ORDER — FENTANYL CITRATE (PF) 100 MCG/2ML IJ SOLN
INTRAMUSCULAR | Status: DC | PRN
  Administered 2022-06-27 (×2): 25 ug via INTRAVENOUS

## 2022-06-27 SURGICAL SUPPLY — 23 items
BALLN EUPHORA RX 2.0X15 (BALLOONS) ×2
BALLN MINITREK RX 1.5X12 (BALLOONS) ×2
BALLN TREK RX 2.5X20 (BALLOONS)
BALLOON EUPHORA RX 2.0X15 (BALLOONS) IMPLANT
BALLOON MINITREK RX 1.5X12 (BALLOONS) IMPLANT
BALLOON TREK RX 2.5X20 (BALLOONS) IMPLANT
BAND ZEPHYR COMPRESS 30 LONG (HEMOSTASIS) IMPLANT
CATH 5FR JL3.5 JR4 ANG PIG MP (CATHETERS) IMPLANT
CATH INFINITI 5FR JK (CATHETERS) IMPLANT
CATH LAUNCHER 6FR JR4 (CATHETERS) IMPLANT
DRAPE BRACHIAL (DRAPES) IMPLANT
GLIDESHEATH SLEND SS 6F .021 (SHEATH) IMPLANT
GUIDEWIRE INQWIRE 1.5J.035X260 (WIRE) IMPLANT
INQWIRE 1.5J .035X260CM (WIRE) ×2
KIT ENCORE 26 ADVANTAGE (KITS) IMPLANT
PACK CARDIAC CATH (CUSTOM PROCEDURE TRAY) ×2 IMPLANT
PROTECTION STATION PRESSURIZED (MISCELLANEOUS) ×2
SET ATX SIMPLICITY (MISCELLANEOUS) IMPLANT
STATION PROTECTION PRESSURIZED (MISCELLANEOUS) IMPLANT
TUBING CIL FLEX 10 FLL-RA (TUBING) IMPLANT
WIRE HI TORQ WHISPER MS 190CM (WIRE) IMPLANT
WIRE RUNTHROUGH .014X180CM (WIRE) IMPLANT
WIRE RUNTHROUGH IZANAI 014 180 (WIRE) IMPLANT

## 2022-06-27 NOTE — Interval H&P Note (Signed)
History and Physical Interval Note:  06/27/2022 12:06 PM  Mark Stevenson  has presented today for surgery, with the diagnosis of L Cath   Unstable angina.  The various methods of treatment have been discussed with the patient and family. After consideration of risks, benefits and other options for treatment, the patient has consented to  Procedure(s): LEFT HEART CATH AND CORONARY ANGIOGRAPHY (Left) as a surgical intervention.  The patient's history has been reviewed, patient examined, no change in status, stable for surgery.  I have reviewed the patient's chart and labs.  Questions were answered to the patient's satisfaction.     [name]

## 2022-06-28 DIAGNOSIS — I10 Essential (primary) hypertension: Secondary | ICD-10-CM | POA: Diagnosis not present

## 2022-06-28 DIAGNOSIS — R338 Other retention of urine: Secondary | ICD-10-CM | POA: Diagnosis not present

## 2022-06-28 DIAGNOSIS — E871 Hypo-osmolality and hyponatremia: Secondary | ICD-10-CM | POA: Diagnosis not present

## 2022-06-28 DIAGNOSIS — E876 Hypokalemia: Secondary | ICD-10-CM | POA: Diagnosis not present

## 2022-06-28 DIAGNOSIS — J449 Chronic obstructive pulmonary disease, unspecified: Secondary | ICD-10-CM | POA: Diagnosis not present

## 2022-06-30 ENCOUNTER — Encounter: Payer: Self-pay | Admitting: Cardiovascular Disease

## 2022-06-30 ENCOUNTER — Encounter: Payer: Self-pay | Admitting: *Deleted

## 2022-06-30 ENCOUNTER — Telehealth: Payer: Self-pay | Admitting: Cardiovascular Disease

## 2022-06-30 NOTE — Telephone Encounter (Signed)
Spoke with patient and he wanted to review again about instructions for his procedure. We discussed everything and advised this is all in the letter they will pick up. He was appreciative for the call back with no further questions for now.

## 2022-06-30 NOTE — Telephone Encounter (Signed)
Staff message received to schedule heart cath for this patient.

## 2022-06-30 NOTE — Telephone Encounter (Signed)
-----   Message from Lamar Laundry, RN sent at 06/27/2022  4:53 PM EDT ----- I am off on Monday 06/30/22 can someone please assist with scheduling this if possible. If not I can take care of it on Tues. ----- Message ----- From: Lamar Laundry, RN Sent: 06/27/2022   4:53 PM EDT To: Rebeca Alert Burl Triage   ----- Message ----- From: Wellington Hampshire, MD Sent: 06/27/2022   4:38 PM EDT To: Minna Merritts, MD; Lamar Laundry, RN  Lattie Haw, This patient had cardiac catheterization done today and has severe RCA stenosis.  PCI was not successful due to heavy calcifications.  He is not a candidate for CABG due to advanced COPD.  I discussed with him proceeding with right coronary artery atherectomy on Wednesday at [facility].  I explained to him the chance of success is only 60 to 70% with higher risk of myocardial infarction due to acute vessel closure.  He wanted to think about his options but was leaning towards having the procedure done.  Please call him on Monday and if he is agreeable, schedule the procedure on Wednesday at 1230.  Thanks.

## 2022-06-30 NOTE — Telephone Encounter (Signed)
-----   Message from Lamar Laundry, RN sent at 06/27/2022  4:53 PM EDT ----- I am off on Monday 06/30/22 can someone please assist with scheduling this if possible. If not I can take care of it on Tues. ----- Message ----- From: Lamar Laundry, RN Sent: 06/27/2022   4:53 PM EDT To: Rebeca Alert Burl Triage   ----- Message ----- From: Wellington Hampshire, MD Sent: 06/27/2022   4:38 PM EDT To: Minna Merritts, MD; Lamar Laundry, RN  Lattie Haw, This patient had cardiac catheterization done today and has severe RCA stenosis.  PCI was not successful due to heavy calcifications.  He is not a candidate for CABG due to advanced COPD.  I discussed with him proceeding with right coronary artery atherectomy on Wednesday at [facility].  I explained to him the chance of success is only 60 to 70% with higher risk of myocardial infarction due to acute vessel closure.  He wanted to think about his options but was leaning towards having the procedure done.  Please call him on Monday and if he is agreeable, schedule the procedure on Wednesday at 1230.  Thanks.

## 2022-06-30 NOTE — Telephone Encounter (Signed)
Patient calling back.   °

## 2022-06-30 NOTE — Telephone Encounter (Signed)
Pt is calling in regards to recent procedure that was cancelled. Pt states this is suppose to be rescheduled to [facility] and would like a call back to discuss when this will be scheduled.

## 2022-06-30 NOTE — Telephone Encounter (Signed)
Spoke with patient and reviewed that we will check on this and be in touch to get that arranged. He verbalized understanding but states he would like this expedited. Advised that we will be in touch with that information. He verbalized understanding.

## 2022-06-30 NOTE — Telephone Encounter (Signed)
Spoke with patient and reviewed instructions, date, time, and location with him. He will have someone come by to pick up letter with all instructions. Patient verbalized understanding of our conversation with no further questions at this time.

## 2022-07-01 ENCOUNTER — Telehealth: Payer: Self-pay | Admitting: *Deleted

## 2022-07-01 NOTE — Telephone Encounter (Signed)
Coronary Atherectomy scheduled at [facility] for: Wednesday July 02, 2022 12:30 PM Arrival time and place: [place] Entrance A at: 10 AM -needs lab  Nothing to eat after midnight prior to procedure, clear liquids until 5 AM day of procedure.  Medication instructions: -Usual morning medications can be taken with sips of water including aspirin 81 mg and Plavix 75 mg.  Confirmed patient has responsible adult to drive home post procedure and be with patient first 24 hours after arriving home.  Patient reports no new symptoms concerning for COVID-19 in the past 10 days.  Reviewed procedure instructions with patient's sister (DPR), Marcie Bal.

## 2022-07-02 ENCOUNTER — Ambulatory Visit (HOSPITAL_COMMUNITY)
Admission: RE | Admit: 2022-07-02 | Discharge: 2022-07-03 | Disposition: A | Discharge status: Home / Self Care | Payer: Medicare Other | Source: Ambulatory Visit | Attending: Cardiovascular Disease | Admitting: Cardiovascular Disease

## 2022-07-02 ENCOUNTER — Other Ambulatory Visit: Payer: Self-pay

## 2022-07-02 ENCOUNTER — Encounter (HOSPITAL_COMMUNITY)
Admission: RE | Disposition: A | Discharge status: Home / Self Care | Payer: Self-pay | Source: Ambulatory Visit | Attending: Cardiovascular Disease

## 2022-07-02 DIAGNOSIS — I2584 Coronary atherosclerosis due to calcified coronary lesion: Secondary | ICD-10-CM | POA: Insufficient documentation

## 2022-07-02 DIAGNOSIS — I2510 Atherosclerotic heart disease of native coronary artery without angina pectoris: Secondary | ICD-10-CM | POA: Diagnosis not present

## 2022-07-02 DIAGNOSIS — I25118 Atherosclerotic heart disease of native coronary artery with other forms of angina pectoris: Secondary | ICD-10-CM | POA: Diagnosis not present

## 2022-07-02 DIAGNOSIS — F17290 Nicotine dependence, other tobacco product, uncomplicated: Secondary | ICD-10-CM | POA: Diagnosis not present

## 2022-07-02 DIAGNOSIS — R339 Retention of urine, unspecified: Secondary | ICD-10-CM | POA: Diagnosis not present

## 2022-07-02 DIAGNOSIS — J449 Chronic obstructive pulmonary disease, unspecified: Secondary | ICD-10-CM | POA: Insufficient documentation

## 2022-07-02 DIAGNOSIS — Z955 Presence of coronary angioplasty implant and graft: Secondary | ICD-10-CM | POA: Diagnosis not present

## 2022-07-02 DIAGNOSIS — F1010 Alcohol abuse, uncomplicated: Secondary | ICD-10-CM | POA: Diagnosis not present

## 2022-07-02 DIAGNOSIS — M4800 Spinal stenosis, site unspecified: Secondary | ICD-10-CM | POA: Diagnosis not present

## 2022-07-02 DIAGNOSIS — Z79899 Other long term (current) drug therapy: Secondary | ICD-10-CM | POA: Insufficient documentation

## 2022-07-02 DIAGNOSIS — Z9981 Dependence on supplemental oxygen: Secondary | ICD-10-CM | POA: Diagnosis not present

## 2022-07-02 DIAGNOSIS — I2089 Other forms of angina pectoris: Secondary | ICD-10-CM | POA: Diagnosis present

## 2022-07-02 DIAGNOSIS — I739 Peripheral vascular disease, unspecified: Secondary | ICD-10-CM | POA: Diagnosis not present

## 2022-07-02 DIAGNOSIS — I10 Essential (primary) hypertension: Secondary | ICD-10-CM | POA: Insufficient documentation

## 2022-07-02 HISTORY — PX: CORONARY STENT INTERVENTION: CATH118234

## 2022-07-02 HISTORY — PX: CORONARY LITHOTRIPSY: CATH118330

## 2022-07-02 LAB — CBC
HCT: 34.9 % — ABNORMAL LOW (ref 39.0–52.0)
Hemoglobin: 11 g/dL — ABNORMAL LOW (ref 13.0–17.0)
MCH: 31.2 pg (ref 26.0–34.0)
MCHC: 31.5 g/dL (ref 30.0–36.0)
MCV: 98.9 fL (ref 80.0–100.0)
Platelets: 171 10*3/uL (ref 150–400)
RBC: 3.53 MIL/uL — ABNORMAL LOW (ref 4.22–5.81)
RDW: 14 % (ref 11.5–15.5)
WBC: 7.7 10*3/uL (ref 4.0–10.5)
nRBC: 0 % (ref 0.0–0.2)

## 2022-07-02 LAB — BASIC METABOLIC PANEL
Anion gap: 9 (ref 5–15)
BUN: 17 mg/dL (ref 8–23)
CO2: 32 mmol/L (ref 22–32)
Calcium: 9.4 mg/dL (ref 8.9–10.3)
Chloride: 99 mmol/L (ref 98–111)
Creatinine, Ser: 0.65 mg/dL (ref 0.61–1.24)
GFR, Estimated: >60 mL/min (ref >60)
Glucose, Bld: 88 mg/dL (ref 70–99)
Potassium: 4.2 mmol/L (ref 3.5–5.1)
Sodium: 140 mmol/L (ref 135–145)

## 2022-07-02 SURGERY — CORONARY STENT INTERVENTION
Anesthesia: LOCAL

## 2022-07-02 MED ORDER — LIDOCAINE HCL (PF) 1 % IJ SOLN
INTRAMUSCULAR | Status: DC | PRN
  Administered 2022-07-02: 15 mL
  Administered 2022-07-02: 2 mL

## 2022-07-02 MED ORDER — HEPARIN SODIUM (PORCINE) 1000 UNIT/ML IJ SOLN
INTRAMUSCULAR | Status: AC
  Filled 2022-07-02: qty 10

## 2022-07-02 MED ORDER — ONDANSETRON HCL 4 MG/2ML IJ SOLN: 4.0000 mg | Freq: Four times a day (QID) | INTRAMUSCULAR | Status: DC | PRN

## 2022-07-02 MED ORDER — ALBUTEROL SULFATE HFA 108 (90 BASE) MCG/ACT IN AERS: 2.0000 | INHALATION_SPRAY | Freq: Four times a day (QID) | RESPIRATORY_TRACT | Status: DC | PRN

## 2022-07-02 MED ORDER — GABAPENTIN 600 MG PO TABS
600.0000 mg | ORAL_TABLET | Freq: Three times a day (TID) | ORAL | Status: DC
  Administered 2022-07-02 – 2022-07-03 (×3): 600 mg via ORAL
  Filled 2022-07-02 (×3): qty 1

## 2022-07-02 MED ORDER — HEPARIN SODIUM (PORCINE) 1000 UNIT/ML IJ SOLN
INTRAMUSCULAR | Status: DC | PRN
  Administered 2022-07-02 (×2): 2000 [IU] via INTRAVENOUS
  Administered 2022-07-02: 7000 [IU] via INTRAVENOUS
  Administered 2022-07-02: 3000 [IU] via INTRAVENOUS
  Administered 2022-07-02: 2000 [IU] via INTRAVENOUS

## 2022-07-02 MED ORDER — IPRATROPIUM-ALBUTEROL 0.5-2.5 (3) MG/3ML IN SOLN
3.0000 mL | Freq: Four times a day (QID) | RESPIRATORY_TRACT | Status: DC
  Administered 2022-07-02 – 2022-07-03 (×2): 3 mL via RESPIRATORY_TRACT
  Filled 2022-07-02 (×2): qty 3

## 2022-07-02 MED ORDER — MIDAZOLAM HCL 2 MG/2ML IJ SOLN
INTRAMUSCULAR | Status: AC
  Filled 2022-07-02: qty 2

## 2022-07-02 MED ORDER — VERAPAMIL HCL 2.5 MG/ML IV SOLN
INTRAVENOUS | Status: AC
  Filled 2022-07-02: qty 2

## 2022-07-02 MED ORDER — MELATONIN 5 MG PO TABS
5.0000 mg | ORAL_TABLET | Freq: Every day | ORAL | Status: DC
  Administered 2022-07-02: 5 mg via ORAL
  Filled 2022-07-02: qty 1

## 2022-07-02 MED ORDER — CLOPIDOGREL BISULFATE 300 MG PO TABS
ORAL_TABLET | ORAL | Status: DC | PRN
  Administered 2022-07-02: 300 mg via ORAL

## 2022-07-02 MED ORDER — SODIUM CHLORIDE 0.9% FLUSH
3.0000 mL | Freq: Two times a day (BID) | INTRAVENOUS | Status: DC
  Administered 2022-07-03: 3 mL via INTRAVENOUS

## 2022-07-02 MED ORDER — VERAPAMIL HCL 2.5 MG/ML IV SOLN
INTRAVENOUS | Status: DC | PRN
  Administered 2022-07-02: 10 mL via INTRA_ARTERIAL

## 2022-07-02 MED ORDER — PANTOPRAZOLE SODIUM 40 MG PO TBEC
40.0000 mg | DELAYED_RELEASE_TABLET | Freq: Every day | ORAL | Status: DC
  Administered 2022-07-03: 40 mg via ORAL
  Filled 2022-07-02: qty 1

## 2022-07-02 MED ORDER — SODIUM CHLORIDE 0.9 % WEIGHT BASED INFUSION
1.0000 mL/kg/h | INTRAVENOUS | Status: AC
  Administered 2022-07-02: 1 mL/kg/h via INTRAVENOUS

## 2022-07-02 MED ORDER — NICOTINE 21 MG/24HR TD PT24
21.0000 mg | MEDICATED_PATCH | Freq: Every day | TRANSDERMAL | Status: DC
  Administered 2022-07-02 – 2022-07-03 (×2): 21 mg via TRANSDERMAL
  Filled 2022-07-02 (×2): qty 1

## 2022-07-02 MED ORDER — TEMAZEPAM 7.5 MG PO CAPS: 7.5000 mg | ORAL_CAPSULE | Freq: Every evening | ORAL | Status: DC | PRN

## 2022-07-02 MED ORDER — SODIUM CHLORIDE 0.9% FLUSH: 3.0000 mL | INTRAVENOUS | Status: DC | PRN

## 2022-07-02 MED ORDER — SODIUM CHLORIDE 0.9 % IV SOLN: 250.0000 mL | INTRAVENOUS | Status: DC | PRN

## 2022-07-02 MED ORDER — ASPIRIN 81 MG PO CHEW
81.0000 mg | CHEWABLE_TABLET | ORAL | Status: AC
  Administered 2022-07-02: 81 mg via ORAL

## 2022-07-02 MED ORDER — LORATADINE 10 MG PO TABS: 10.0000 mg | ORAL_TABLET | Freq: Every day | ORAL | Status: DC | PRN

## 2022-07-02 MED ORDER — CHLORHEXIDINE GLUCONATE CLOTH 2 % EX PADS
6.0000 | MEDICATED_PAD | Freq: Every day | CUTANEOUS | Status: DC
  Administered 2022-07-02: 6 via TOPICAL

## 2022-07-02 MED ORDER — SODIUM CHLORIDE 0.9 % WEIGHT BASED INFUSION
3.0000 mL/kg/h | INTRAVENOUS | Status: DC
  Administered 2022-07-02: 3 mL/kg/h via INTRAVENOUS

## 2022-07-02 MED ORDER — LIDOCAINE HCL (PF) 1 % IJ SOLN
INTRAMUSCULAR | Status: AC
  Filled 2022-07-02: qty 30

## 2022-07-02 MED ORDER — HYDROCODONE-ACETAMINOPHEN 10-325 MG PO TABS
1.0000 | ORAL_TABLET | Freq: Four times a day (QID) | ORAL | Status: DC | PRN
  Administered 2022-07-02: 1 via ORAL
  Filled 2022-07-02: qty 1

## 2022-07-02 MED ORDER — FENTANYL CITRATE (PF) 100 MCG/2ML IJ SOLN
INTRAMUSCULAR | Status: DC | PRN
  Administered 2022-07-02 (×4): 50 ug via INTRAVENOUS

## 2022-07-02 MED ORDER — SODIUM CHLORIDE 0.9 % IV SOLN
INTRAVENOUS | Status: AC | PRN
  Administered 2022-07-02: 10 mL/h via INTRAVENOUS

## 2022-07-02 MED ORDER — MIDAZOLAM HCL 2 MG/2ML IJ SOLN
INTRAMUSCULAR | Status: DC | PRN
  Administered 2022-07-02 (×2): 1 mg via INTRAVENOUS

## 2022-07-02 MED ORDER — BUDESONIDE 0.25 MG/2ML IN SUSP
0.2500 mg | Freq: Every day | RESPIRATORY_TRACT | Status: DC
  Filled 2022-07-02: qty 2

## 2022-07-02 MED ORDER — HEPARIN (PORCINE) IN NACL 1000-0.9 UT/500ML-% IV SOLN
INTRAVENOUS | Status: AC
  Filled 2022-07-02: qty 1000

## 2022-07-02 MED ORDER — CLOPIDOGREL BISULFATE 300 MG PO TABS
ORAL_TABLET | ORAL | Status: AC
  Filled 2022-07-02: qty 1

## 2022-07-02 MED ORDER — SODIUM CHLORIDE 0.9 % WEIGHT BASED INFUSION: 1.0000 mL/kg/h | INTRAVENOUS | Status: DC

## 2022-07-02 MED ORDER — ASPIRIN 81 MG PO TBEC
81.0000 mg | DELAYED_RELEASE_TABLET | Freq: Every day | ORAL | Status: DC
  Administered 2022-07-03: 81 mg via ORAL
  Filled 2022-07-02: qty 1

## 2022-07-02 MED ORDER — NICOTINE 21 MG/24HR TD PT24: 21.0000 mg | MEDICATED_PATCH | Freq: Every day | TRANSDERMAL | Status: DC

## 2022-07-02 MED ORDER — ALBUTEROL SULFATE (2.5 MG/3ML) 0.083% IN NEBU
2.5000 mg | INHALATION_SOLUTION | RESPIRATORY_TRACT | Status: DC | PRN
  Administered 2022-07-02: 2.5 mg via RESPIRATORY_TRACT
  Filled 2022-07-02: qty 3

## 2022-07-02 MED ORDER — FENTANYL CITRATE (PF) 100 MCG/2ML IJ SOLN
INTRAMUSCULAR | Status: AC
  Filled 2022-07-02: qty 2

## 2022-07-02 MED ORDER — NITROGLYCERIN 1 MG/10 ML FOR IR/CATH LAB
INTRA_ARTERIAL | Status: AC
  Filled 2022-07-02: qty 10

## 2022-07-02 MED ORDER — NITROGLYCERIN 1 MG/10 ML FOR IR/CATH LAB
INTRA_ARTERIAL | Status: DC | PRN
  Administered 2022-07-02: 200 ug via INTRACORONARY

## 2022-07-02 MED ORDER — SODIUM CHLORIDE 0.9% FLUSH: 3.0000 mL | Freq: Two times a day (BID) | INTRAVENOUS | Status: DC

## 2022-07-02 MED ORDER — TEMAZEPAM 7.5 MG PO CAPS
15.0000 mg | ORAL_CAPSULE | Freq: Every day | ORAL | Status: DC
  Administered 2022-07-02: 15 mg via ORAL
  Filled 2022-07-02: qty 2

## 2022-07-02 MED ORDER — LOSARTAN POTASSIUM 50 MG PO TABS
100.0000 mg | ORAL_TABLET | Freq: Every day | ORAL | Status: DC
  Administered 2022-07-03: 100 mg via ORAL
  Filled 2022-07-02: qty 2

## 2022-07-02 MED ORDER — IOHEXOL 350 MG/ML SOLN
INTRAVENOUS | Status: DC | PRN
  Administered 2022-07-02: 230 mL via INTRA_ARTERIAL

## 2022-07-02 MED ORDER — HEPARIN (PORCINE) IN NACL 1000-0.9 UT/500ML-% IV SOLN
INTRAVENOUS | Status: DC | PRN
  Administered 2022-07-02 (×3): 500 mL

## 2022-07-02 MED ORDER — PRAVASTATIN SODIUM 40 MG PO TABS
40.0000 mg | ORAL_TABLET | Freq: Every day | ORAL | Status: DC
  Administered 2022-07-02: 40 mg via ORAL
  Filled 2022-07-02: qty 1

## 2022-07-02 MED ORDER — LORAZEPAM 0.5 MG PO TABS
0.5000 mg | ORAL_TABLET | Freq: Four times a day (QID) | ORAL | Status: DC | PRN
  Administered 2022-07-02 – 2022-07-03 (×3): 0.5 mg via ORAL
  Filled 2022-07-02 (×3): qty 1

## 2022-07-02 MED ORDER — CLOPIDOGREL BISULFATE 75 MG PO TABS
75.0000 mg | ORAL_TABLET | Freq: Every day | ORAL | Status: DC
  Administered 2022-07-03: 75 mg via ORAL
  Filled 2022-07-02: qty 1

## 2022-07-02 MED ORDER — CLOPIDOGREL BISULFATE 75 MG PO TABS
75.0000 mg | ORAL_TABLET | ORAL | Status: AC
  Administered 2022-07-02: 75 mg via ORAL

## 2022-07-02 SURGICAL SUPPLY — 38 items
BALL SAPPHIRE NC24 3.0X26 (BALLOONS) ×1
BALL SAPPHIRE NC24 3.25X15 (BALLOONS) ×1
BALL SAPPHIRE NC24 3.25X26 (BALLOONS) ×1
BALLN SAPPHIRE 2.0X12 (BALLOONS) ×1
BALLN SCOREFLEX 2.50X20 (BALLOONS) ×1
BALLOON SAPPHIRE 2.0X12 (BALLOONS) IMPLANT
BALLOON SAPPHIRE NC24 3.0X26 (BALLOONS) IMPLANT
BALLOON SAPPHIRE NC24 3.25X15 (BALLOONS) IMPLANT
BALLOON SAPPHIRE NC24 3.25X26 (BALLOONS) IMPLANT
BALLOON SCOREFLEX 2.50X20 (BALLOONS) IMPLANT
BALLOON TAKERU OTW 1.5X15 (BALLOONS) IMPLANT
CATH LAUNCHER 6FR AL.75 (CATHETERS) IMPLANT
CATH SHOCKWAVE C2 3.0X12 (CATHETERS) IMPLANT
CATH TELEPORT (CATHETERS) IMPLANT
DEVICE CLOSURE MYNXGRIP 6/7F (Vascular Products) IMPLANT
DEVICE RAD COMP TR BAND LRG (VASCULAR PRODUCTS) IMPLANT
ELECT DEFIB PAD ADLT CADENCE (PAD) IMPLANT
GLIDESHEATH SLEND SS 6F .021 (SHEATH) IMPLANT
GUIDEWIRE INQWIRE 1.5J.035X260 (WIRE) IMPLANT
INQWIRE 1.5J .035X260CM (WIRE) ×1
KIT ENCORE 26 ADVANTAGE (KITS) IMPLANT
KIT HEART LEFT (KITS) ×1 IMPLANT
KIT MICROPUNCTURE NIT STIFF (SHEATH) IMPLANT
PACK CARDIAC CATHETERIZATION (CUSTOM PROCEDURE TRAY) ×1 IMPLANT
SHEATH PINNACLE 6F 10CM (SHEATH) IMPLANT
SHEATH PROBE COVER 6X72 (BAG) IMPLANT
STENT SYNERGY XD 3.0X48 (Permanent Stent) IMPLANT
STENT SYNERGY XD 3.50X8 (Permanent Stent) IMPLANT
SYNERGY XD 3.0X48 (Permanent Stent) ×1 IMPLANT
SYNERGY XD 3.50X8 (Permanent Stent) ×1 IMPLANT
TRANSDUCER W/STOPCOCK (MISCELLANEOUS) ×1 IMPLANT
TUBING CIL FLEX 10 FLL-RA (TUBING) ×1 IMPLANT
WIRE EMERALD 3MM-J .035X150CM (WIRE) IMPLANT
WIRE EMERALD ST .035X150CM (WIRE) IMPLANT
WIRE RUNTHROUGH .014X180CM (WIRE) IMPLANT
WIRE RUNTHROUGH .014X300CM (WIRE) IMPLANT
WIRE RUNTHROUGH IZANAI 014 180 (WIRE) IMPLANT
WIRE RUNTHROUGH IZANAI 014 300 (WIRE) IMPLANT

## 2022-07-02 NOTE — Progress Notes (Signed)
Pt arrived to [place] from cath lab s/p stent placement. CHG bath given. Pt A&Ox4. VSS. Pain 4/10. TR band 17ccs air per report. Slight bruising at area. Groin site level 0. Call light in reach.   Raelyn Number, RN

## 2022-07-02 NOTE — Interval H&P Note (Signed)
History and Physical Interval Note:  07/02/2022 1:05 PM  Mark Stevenson  has presented today for surgery, with the diagnosis of rca stenosis.  The various methods of treatment have been discussed with the patient and family. After consideration of risks, benefits and other options for treatment, the patient has consented to  Procedure(s): CORONARY ATHERECTOMY (N/A) as a surgical intervention.  The patient's history has been reviewed, patient examined, no change in status, stable for surgery.  I have reviewed the patient's chart and labs.  Questions were answered to the patient's satisfaction.     [name]

## 2022-07-03 ENCOUNTER — Encounter (HOSPITAL_COMMUNITY): Payer: Self-pay | Admitting: Cardiovascular Disease

## 2022-07-03 DIAGNOSIS — F17290 Nicotine dependence, other tobacco product, uncomplicated: Secondary | ICD-10-CM | POA: Diagnosis not present

## 2022-07-03 DIAGNOSIS — I2089 Other forms of angina pectoris: Secondary | ICD-10-CM | POA: Diagnosis not present

## 2022-07-03 DIAGNOSIS — Z955 Presence of coronary angioplasty implant and graft: Secondary | ICD-10-CM | POA: Diagnosis not present

## 2022-07-03 DIAGNOSIS — M4800 Spinal stenosis, site unspecified: Secondary | ICD-10-CM | POA: Diagnosis not present

## 2022-07-03 DIAGNOSIS — Z9981 Dependence on supplemental oxygen: Secondary | ICD-10-CM | POA: Diagnosis not present

## 2022-07-03 DIAGNOSIS — Z79899 Other long term (current) drug therapy: Secondary | ICD-10-CM | POA: Diagnosis not present

## 2022-07-03 DIAGNOSIS — R339 Retention of urine, unspecified: Secondary | ICD-10-CM | POA: Diagnosis not present

## 2022-07-03 DIAGNOSIS — J449 Chronic obstructive pulmonary disease, unspecified: Secondary | ICD-10-CM | POA: Diagnosis not present

## 2022-07-03 DIAGNOSIS — I2584 Coronary atherosclerosis due to calcified coronary lesion: Secondary | ICD-10-CM | POA: Diagnosis not present

## 2022-07-03 DIAGNOSIS — I739 Peripheral vascular disease, unspecified: Secondary | ICD-10-CM | POA: Diagnosis not present

## 2022-07-03 DIAGNOSIS — I10 Essential (primary) hypertension: Secondary | ICD-10-CM | POA: Diagnosis not present

## 2022-07-03 DIAGNOSIS — I25118 Atherosclerotic heart disease of native coronary artery with other forms of angina pectoris: Secondary | ICD-10-CM | POA: Diagnosis not present

## 2022-07-03 LAB — POCT ACTIVATED CLOTTING TIME
Activated Clotting Time: 251 seconds
Activated Clotting Time: 257 seconds
Activated Clotting Time: 269 seconds
Activated Clotting Time: 245 seconds
Activated Clotting Time: 263 seconds

## 2022-07-03 LAB — CBC
HCT: 30.5 % — ABNORMAL LOW (ref 39.0–52.0)
Hemoglobin: 9.8 g/dL — ABNORMAL LOW (ref 13.0–17.0)
MCH: 31.2 pg (ref 26.0–34.0)
MCHC: 32.1 g/dL (ref 30.0–36.0)
MCV: 97.1 fL (ref 80.0–100.0)
Platelets: 157 10*3/uL (ref 150–400)
RBC: 3.14 MIL/uL — ABNORMAL LOW (ref 4.22–5.81)
RDW: 14.3 % (ref 11.5–15.5)
WBC: 7.8 10*3/uL (ref 4.0–10.5)
nRBC: 0 % (ref 0.0–0.2)

## 2022-07-03 LAB — BASIC METABOLIC PANEL
Anion gap: 8 (ref 5–15)
BUN: 21 mg/dL (ref 8–23)
CO2: 29 mmol/L (ref 22–32)
Calcium: 9.1 mg/dL (ref 8.9–10.3)
Chloride: 103 mmol/L (ref 98–111)
Creatinine, Ser: 0.63 mg/dL (ref 0.61–1.24)
GFR, Estimated: >60 mL/min (ref >60)
Glucose, Bld: 91 mg/dL (ref 70–99)
Potassium: 4.6 mmol/L (ref 3.5–5.1)
Sodium: 140 mmol/L (ref 135–145)

## 2022-07-03 MED ORDER — FUROSEMIDE 10 MG/ML IJ SOLN
20.0000 mg | Freq: Once | INTRAMUSCULAR | Status: AC
  Administered 2022-07-03: 20 mg via INTRAVENOUS
  Filled 2022-07-03: qty 2

## 2022-07-03 MED ORDER — NITROGLYCERIN 0.4 MG SL SUBL: 0.4000 mg | SUBLINGUAL_TABLET | SUBLINGUAL | 3 refills | Status: AC | PRN

## 2022-07-03 MED ORDER — IPRATROPIUM-ALBUTEROL 0.5-2.5 (3) MG/3ML IN SOLN: 3.0000 mL | RESPIRATORY_TRACT | Status: DC | PRN

## 2022-07-03 NOTE — TOC Initial Note (Signed)
Transition of Care [facility]) - Initial/Assessment Note    Patient Details  Name: Mark Stevenson MRN: [id number] Date of Birth: 29-Apr-1956  [facility] CM/SW Contact:    Bethena Roys, RN Phone Number: 07/03/2022, 10:47 AM  Clinical Narrative: Case Manager received a call from the Cardiology PA and patient states he was previously active with [facility]. Patient wants to return home with Hospice Services. Case Manager called [place] and she will speak with patient regarding reinstating Hospice Services. Patient has DME hospital bed, oxygen, and portable oxygen tank for travel. Patient states he has a caregiver that provides 24 hour care in the home. Caregiver will provide transportation home. No further needs identified at this time.               Expected Discharge Plan: Home w Hospice Care Barriers to Discharge: No Barriers Identified   Patient Goals and CMS Choice Patient states their goals for this hospitalization and ongoing recovery are:: to return home with hospice CMS Medicare.gov Compare Post Acute Care list provided to::  (Patient was previously active with Hospice) Choice offered to / list presented to :  (Patient was previously active with [place] wants to reinstate.)  Expected Discharge Plan and Services Expected Discharge Plan: [place]   Discharge Planning Services: CM Consult Post Acute Care Choice: Hospice Living arrangements for the past 2 months: Single Family Home Expected Discharge Date: 07/03/22               DME Arranged: N/A         HH Arranged:  (Hospice RN) [place] Agency: Hospice and [place] ([place]) Date [facility] Agency Contacted: 07/03/22 Time HH Agency Contacted: 1045 Representative spoke with at [place]: Anderson [place]  Prior Living Arrangements/Services Living arrangements for the past 2 months: [place] with:: Self, Other (Comment)  (Has a caregiver 24 hours day) Patient language and need for interpreter reviewed:: Yes Do you feel safe going back to the place where you live?: Yes      Need for Family Participation in Patient Care: Yes (Comment) Care giver support system in place?: Yes (comment) Current home services: DME (Patient has hospital bed, oxygen concentrator, and portable tank) Criminal Activity/Legal Involvement Pertinent to Current Situation/Hospitalization: No - Comment as needed  Activities of Daily Living Home Assistive Devices/Equipment: Environmental consultant (specify type), Wheelchair ADL Screening (condition at time of admission) Patient's cognitive ability adequate to safely complete daily activities?: Yes Is the patient deaf or have difficulty hearing?: No Does the patient have difficulty seeing, even when wearing glasses/contacts?: No Does the patient have difficulty concentrating, remembering, or making decisions?: No Patient able to express need for assistance with ADLs?: Yes Does the patient have difficulty dressing or bathing?: Yes Independently performs ADLs?: No Communication: Independent Dressing (OT): Needs assistance Is this a change from baseline?: Pre-admission baseline Grooming: Needs assistance Is this a change from baseline?: Pre-admission baseline Feeding: Independent Bathing: Needs assistance Is this a change from baseline?: Pre-admission baseline Is this a change from baseline?: Pre-admission baseline In/Out Bed: Needs assistance Is this a change from baseline?: Pre-admission baseline Walks in Home: Needs assistance Is this a change from baseline?: Pre-admission baseline Does the patient have difficulty walking or climbing stairs?: Yes Weakness of Legs: Both Weakness of Arms/Hands: Both  Permission Sought/Granted Permission sought to share information with : Case Manager, Customer service manager, Family Supports Permission granted to share information with :  Yes, Verbal  Permission Granted        Emotional Assessment Appearance:: Appears stated age Attitude/Demeanor/Rapport: Engaged Affect (typically observed): Accepting Orientation: : Oriented to Self Alcohol / Substance Use: Not Applicable Psych Involvement: No (comment)  Admission diagnosis:  Effort angina [I20.89] Patient Active Problem List   Diagnosis Date Noted   Effort angina 07/02/2022   Unstable angina (HCC)    Protein-calorie malnutrition, severe 04/12/2022   Failure to thrive in adult 04/10/2022   Acute on chronic respiratory failure with hypoxia and hypercapnia ([place]) 04/09/2022   Pressure injury of skin 04/09/2022   SOB (shortness of breath) 03/31/2022   Chronic respiratory failure with hypoxia ([place]) 12/26/2021   Acute urinary retention    Closed wedge compression fracture of T11 vertebra (HCC)    Hypomagnesemia    Hypokalemia    Hyponatremia    Alcohol abuse    Fall 03/11/2021   Closed compression fracture of second lumbar vertebra ([place]) 03/11/2021   Rhabdomyolysis 03/11/2021   Thrombocytopenia ([place]) 03/11/2021   COPD with acute exacerbation ([place])    Hypertension    Cigarette smoker    Alcohol dependence ([place])    Complete rotator cuff tear 03/19/2017   Spinal stenosis of lumbar region 10/22/2016   PCP:  Alroy Dust, L.Marlou Sa, MD Pharmacy:   [id number] [id number] [id number]: [phone]: [phone]   Readmission Risk Interventions     No data to display

## 2022-07-03 NOTE — Progress Notes (Signed)
Rounding Note    Patient Name: Mark Stevenson Date of Encounter: 07/03/2022  [place] Cardiologist: Ida Rogue, MD   Subjective   No CP; short of breath this morning.  Inpatient Medications    Scheduled Meds:  aspirin EC  81 mg Oral Daily   budesonide  0.25 mg Nebulization Daily   Chlorhexidine Gluconate Cloth  6 each Topical Q0600   clopidogrel  75 mg Oral Daily   gabapentin  600 mg Oral TID   ipratropium-albuterol  3 mL Nebulization QID   losartan  100 mg Oral Daily   melatonin  5 mg Oral QHS   nicotine  21 mg Transdermal Q0600   pantoprazole  40 mg Oral Daily   pravastatin  40 mg Oral q1800   sodium chloride flush  3 mL Intravenous Q12H   sodium chloride flush  3 mL Intravenous Q12H   temazepam  15 mg Oral QHS   Continuous Infusions:  sodium chloride     PRN Meds: sodium chloride, albuterol, HYDROcodone-acetaminophen, loratadine, LORazepam, ondansetron (ZOFRAN) IV, sodium chloride flush, temazepam   Vital Signs    Vitals:   07/02/22 1815 07/02/22 2047 07/02/22 2130 07/03/22 0352  BP: 132/84  134/79 131/83  Pulse: 71  78 64  Resp:   16 16  Temp:   97.9 F (36.6 C) 98.4 F (36.9 C)  TempSrc:   Oral Oral  SpO2: 99% 99% 99% 97%  Weight:      Height:        Intake/Output Summary (Last 24 hours) at 07/03/2022 0803 Last data filed at 07/03/2022 0529 Gross per 24 hour  Intake 2095.87 ml  Output 1850 ml  Net 245.87 ml      07/02/2022    9:39 AM 06/27/2022    9:58 AM 06/24/2022   10:47 AM  Last 3 Weights  Weight (lbs) 150 lb 150 lb 150 lb 8 oz  Weight (kg) 68.04 kg 68.04 kg 68.266 kg      Telemetry    Sinus - Personally Reviewed   Physical Exam   GEN: No acute distress.   Neck: No JVD Cardiac: RRR Respiratory: Diminished breath sounds throughout with expiratory wheeze GI: Soft, nontender, non-distended  MS: No edema; radial cath site with no hematoma. Neuro:  Nonfocal  Psych: Normal affect   Labs     Chemistry Recent  Labs  Lab 07/02/22 1001 07/03/22 0135  NA 140 140  K 4.2 4.6  CL 99 103  CO2 32 29  GLUCOSE 88 91  BUN 17 21  CREATININE 0.65 0.63  CALCIUM 9.4 9.1  GFRNONAA >60 >60  ANIONGAP 9 8     Hematology Recent Labs  Lab 06/27/22 1112 07/02/22 1001 07/03/22 0135  WBC 8.4 7.7 7.8  RBC 3.89* 3.53* 3.14*  HGB 11.7* 11.0* 9.8*  HCT 38.2* 34.9* 30.5*  MCV 98.2 98.9 97.1  MCH 30.1 31.2 31.2  MCHC 30.6 31.5 32.1  RDW 13.4 14.0 14.3  PLT 173 171 157    Radiology    CARDIAC CATHETERIZATION  Result Date: 07/02/2022   Prox RCA lesion is 60% stenosed.   Prox RCA to Mid RCA lesion is 90% stenosed.   Mid RCA lesion is 99% stenosed.   Acute Mrg lesion is 90% stenosed.   A drug-eluting stent was successfully placed using a SYNERGY XD 3.0X48.   A drug-eluting stent was successfully placed using a SYNERGY XD 3.50X8.   Balloon angioplasty was performed using a BALLN SAPPHIRE 2.0X12.  Post intervention, there is a 0% residual stenosis.   Post intervention, there is a 0% residual stenosis.   Post intervention, there is a 0% residual stenosis.   Post intervention, there is a 50% residual stenosis. Very difficult but successful PCI with intravascular lithotripsy and 2 overlapped drug-eluting stent placement to the mid and proximal right coronary artery.  Balloon angioplasty had to be done first on the bifurcating RV marginal in order to facilitate crossing with a wire into the distal RCA. Recommendations: Dual antiplatelet therapy for at least 6 months. Aggressive treatment of risk factors.     Patient Profile     66 y.o. male with past medical history of COPD, hypertension, alcohol abuse, urinary retention with abnormal cardiac CTA.  Echocardiogram August 2023 showed normal LV function, mild left ventricular hypertrophy, grade 1 diastolic dysfunction.  Cardiac catheterization showed 90% mid right coronary artery, 99% mid right coronary artery, 90% acute marginal and otherwise nonobstructive disease.   Patient subsequently had PCI of the right coronary artery with 2 drug-eluting stents.  Assessment & Plan    1 coronary artery disease status post PCI of the right coronary artery-continue aspirin, Plavix and statin.  2 hypertension-blood pressure controlled.  Continue present medications.  3 peripheral vascular disease-continue aspirin and statin.  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  Note patient had been on hospice care which was temporarily discontinued for PCI.  This will be resumed at discharge.  He will be discharged today.  We will arrange follow-up in [place] in 4 weeks.  Greater than 30 minutes PA and physician time. D2  For questions or updates, please contact [place] Please consult [email or web address] for contact info under        Signed, Kirk Ruths, MD  07/03/2022, 8:03 AM

## 2022-07-03 NOTE — Progress Notes (Signed)
[facility]) Hospital Liaison Note   Received referral from Jacqlyn Krauss.  Patient admitted for PCI.     Visited with Patient at bedside.  Patient had services with [facility] prior, but discharged from [place] to receive PCI.  He now wishes to re-enroll.    Address has been verified.  Family contact Donne Hazel, [phone].  Pt currently has DME in the home including, hospital bed, O2 concentrator, tank, and travel concentrator.  Pt is requesting a walker. [facility] will order this.    Report exchanged with hospital staff.   Please provide prescriptions for symptom management at discharge, if applicable.   Anticipated discharge back home via POV.    Thank you for the opportunity to participate in this patient's care.    Kenna Gilbert BSN, RN  [facility] [phone] [place] [phone]

## 2022-07-03 NOTE — Plan of Care (Signed)
  Problem: Cardiovascular: Goal: Ability to achieve and maintain adequate cardiovascular perfusion will improve Outcome: Progressing Goal: Vascular access site(s) Level 0-1 will be maintained Outcome: Progressing   Problem: Clinical Measurements: Goal: Ability to maintain clinical measurements within normal limits will improve Outcome: Progressing Goal: Will remain free from infection Outcome: Progressing Goal: Diagnostic test results will improve Outcome: Progressing Goal: Respiratory complications will improve Outcome: Progressing Goal: Cardiovascular complication will be avoided Outcome: Progressing   

## 2022-07-03 NOTE — Progress Notes (Addendum)
CARDIAC REHAB PHASE I   PRE:  Rate/Rhythm: 80 NSR  BP:  Sitting: 153/87      SaO2: 99 2.5L  MODE:  Ambulation: 70 ft   POST:  Rate/Rhythm: 96 NSR   BP:  Sitting: 147/96      SaO2: 100 2.5L  Pt was ambulated with x2 assist with RW, foley, and 3L O2. During ambulation pt c/p of dizziness 3/10 SOB 5/10, and leg weakness. Pt maintained SpO2 96-99% on 3L. Pt symptoms resolved with rest. Pt was returned to bed and educated on the importance of Plavix and Aspirin and walking as tolerated. Pt is not eligible for CRPII as he plans to return to hospice care.    [id number]  [place] AM 07/03/2022

## 2022-07-03 NOTE — Discharge Summary (Signed)
Discharge Summary    Patient ID: Mark Stevenson MRN: [id number]; DOB: [birth year]  Admit date: 07/02/2022 Discharge date: 07/03/2022  PCP:  Alroy Dust, L.Marlou Sa, [place] Prov[facility] Cardiologist:  Ida Rogue, MD        Discharge Diagnoses    Principal Problem:   Effort angina    Diagnostic Studies/Procedures    CARDIAC CATH: 07/02/2022   Prox RCA lesion is 60% stenosed.   Prox RCA to Mid RCA lesion is 90% stenosed.   Mid RCA lesion is 99% stenosed.   Acute Mrg lesion is 90% stenosed.   A drug-eluting stent was successfully placed using a SYNERGY XD 3.0X48.   A drug-eluting stent was successfully placed using a SYNERGY XD 3.50X8.   Balloon angioplasty was performed using a BALLN SAPPHIRE 2.0X12.   Post intervention, there is a 0% residual stenosis.   Post intervention, there is a 0% residual stenosis.   Post intervention, there is a 0% residual stenosis.   Post intervention, there is a 50% residual stenosis.   Very difficult but successful PCI with intravascular lithotripsy and 2 overlapped drug-eluting stent placement to the mid and proximal right coronary artery.  Balloon angioplasty had to be done first on the bifurcating RV marginal in order to facilitate crossing with a wire into the distal RCA. Diagnostic Dominance: Right  Intervention    _____________   History of Present Illness     Mark Stevenson is a 66 y.o. male with past medical history of COPD, hypertension, alcohol abuse, urinary retention with abnormal cardiac CTA.  Echocardiogram August 2023 showed normal LV function, mild left ventricular hypertrophy, grade 1 diastolic dysfunction.   Pt had Cardiac catheterization 09/26 which showed 90% mid right coronary artery, 99% mid right coronary artery, 90% acute marginal and otherwise nonobstructive disease.    Patient came in 10/04 for PCI of the right coronary artery with 2 drug-eluting stents.  Hospital Course     Consultants:  None   Pt had DES x 2 to the RCA and PTCA bifurcation dist RCA and acute marginal. Tolerated the procedure well.  On 10/05, he was seen by Dr Stanford Breed and all data were reviewed.  He has [facility] at home and is in Hospice. S.W. was contacted and assisted in resuming these.   He had some mild volume overload and was given a single dose of IV Lasix 20 mg.   His H&H are a bit lower than previous at 9.8. This is likely due to some blood loss during the procedure, can recheck at follow up.   He will continue on home O2 at 2 lpm and other home meds. No further inpatient workup is indicated and he can be d/c'd today, with outpatient follow up arranged.  Did the patient have an acute coronary syndrome (MI, NSTEMI, STEMI, etc) this admission?:  No                               Did the patient have a percutaneous coronary intervention (stent / angioplasty)?:  Yes.     Cath/PCI Registry Performance & Quality Measures: Aspirin prescribed? - Yes ADP Receptor Inhibitor (Plavix/Clopidogrel, Brilinta/Ticagrelor or Effient/Prasugrel) prescribed (includes medically managed patients)? - Yes High Intensity Statin (Lipitor 40-46m or Crestor 20-470m prescribed? - No - in Hospice For EF <40%, was ACEI/ARB prescribed? - Not Applicable (EF >/= 4013%For EF <40%, Aldosterone Antagonist (Spironolactone or Eplerenone) prescribed? - Not Applicable (  EF >/= 40%) Cardiac Rehab Phase II ordered? -      _____________  Discharge Vitals Blood pressure (!) 149/94, pulse 76, temperature 98.1 F (36.7 C), temperature source Oral, resp. rate 18, height 5' 11.5" (1.816 m), weight 68 kg, SpO2 100 %.  Filed Weights   07/02/22 0939  Weight: 68 kg    Labs & Radiologic Studies    CBC Recent Labs    07/02/22 1001 07/03/22 0135  WBC 7.7 7.8  HGB 11.0* 9.8*  HCT 34.9* 30.5*  MCV 98.9 97.1  PLT 171 834   Basic Metabolic Panel Recent Labs    07/02/22 1001 07/03/22 0135  NA 140 140  K 4.2 4.6  CL 99 103  CO2  32 29  GLUCOSE 88 91  BUN 17 21  CREATININE 0.65 0.63  CALCIUM 9.4 9.1   Liver Function Tests No results for input(s): "AST", "ALT", "ALKPHOS", "BILITOT", "PROT", "ALBUMIN" in the last 72 hours. No results for input(s): "LIPASE", "AMYLASE" in the last 72 hours. High Sensitivity Troponin:   No results for input(s): "TROPONINIHS" in the last 720 hours.  BNP Invalid input(s): "POCBNP" D-Dimer No results for input(s): "DDIMER" in the last 72 hours. Hemoglobin A1C No results for input(s): "HGBA1C" in the last 72 hours. Fasting Lipid Panel No results for input(s): "CHOL", "HDL", "LDLCALC", "TRIG", "CHOLHDL", "LDLDIRECT" in the last 72 hours. Thyroid Function Tests No results for input(s): "TSH", "T4TOTAL", "T3FREE", "THYROIDAB" in the last 72 hours.  Invalid input(s): "FREET3" _____________  CARDIAC CATHETERIZATION  Result Date: 07/02/2022   Prox RCA lesion is 60% stenosed.   Prox RCA to Mid RCA lesion is 90% stenosed.   Mid RCA lesion is 99% stenosed.   Acute Mrg lesion is 90% stenosed.   A drug-eluting stent was successfully placed using a SYNERGY XD 3.0X48.   A drug-eluting stent was successfully placed using a SYNERGY XD 3.50X8.   Balloon angioplasty was performed using a BALLN SAPPHIRE 2.0X12.   Post intervention, there is a 0% residual stenosis.   Post intervention, there is a 0% residual stenosis.   Post intervention, there is a 0% residual stenosis.   Post intervention, there is a 50% residual stenosis. Very difficult but successful PCI with intravascular lithotripsy and 2 overlapped drug-eluting stent placement to the mid and proximal right coronary artery.  Balloon angioplasty had to be done first on the bifurcating RV marginal in order to facilitate crossing with a wire into the distal RCA. Recommendations: Dual antiplatelet therapy for at least 6 months. Aggressive treatment of risk factors.   CARDIAC CATHETERIZATION  Result Date: 06/27/2022   Prox RCA lesion is 60% stenosed.    Prox RCA to Mid RCA lesion is 90% stenosed.   Mid RCA lesion is 99% stenosed.   Acute Mrg lesion is 90% stenosed.   Prox LAD to Mid LAD lesion is 30% stenosed.   Mid LAD lesion is 40% stenosed. 1.  Severe one-vessel coronary artery disease involving the right coronary artery which is 99% stenosed and heavily calcified in a long segment at the bifurcation of a large RV marginal which is also disease at the ostium.  Moderate LAD disease. 2.  Left ventricular angiography was not performed.  EF is normal by echo.  Mildly elevated left ventricular end-diastolic pressure. 3.  Attempted unsuccessful angioplasty of the right coronary artery due to inability to cross the stenosis into the distal RCA in spite of using multiple wires and attempting to balloon the mid segment but was not  able to cross with a 1.5 balloon. Recommendations: Difficult management options.  The patient is not a candidate for one-vessel CABG due to COPD. We will gust with interventional colleagues and likely reattempt angioplasty at [facility] using an AL guide, likely femoral approach and long wires with a microcatheter.  Given degree of calcifications, will likely require atherectomy.   Disposition   Pt is being discharged home today in good condition.  Follow-up Plans & Appointments     Follow-up Information     [facility] Follow up.   Specialty: PALLIATIVE CARE Why: [facility]osp[facility]- Agency to contact the patient for visit times. Contact information: [place] [phone]               Discharge Instructions     Amb Referral to Cardiac Rehabilitation   Complete by: As directed    Diagnosis: Coronary Stents   After initial evaluation and assessments completed: Virtual Based Care may be provided alone or in conjunction with Phase 2 Cardiac Rehab based on patient barriers.: Yes   Intensive Cardiac Rehabilitation (ICR) [place] location only OR Traditional Cardiac  Rehabilitation (TCR) *If criteria for ICR are not met will enroll in TCR [facility] only): Yes   Diet - low sodium heart healthy   Complete by: As directed    Increase activity slowly   Complete by: As directed        Discharge Medications   Allergies as of 07/03/2022   No Known Allergies      Medication List     TAKE these medications    albuterol (2.5 MG/3ML) 0.083% nebulizer solution Commonly known as: PROVENTIL Take 3 mLs (2.5 mg total) by nebulization every 4 (four) hours as needed for wheezing or shortness of breath.   albuterol 108 (90 Base) MCG/ACT inhaler Commonly known as: VENTOLIN HFA Inhale 2 puffs into the lungs every 4 (four) hours as needed.   aspirin EC 81 MG tablet Take 1 tablet (81 mg total) by mouth daily. Swallow whole.   budesonide 0.25 MG/2ML nebulizer solution Commonly known as: Pulmicort Take 2 mLs (0.25 mg total) by nebulization daily.   clopidogrel 75 MG tablet Commonly known as: Plavix Take 1 tablet (75 mg total) by mouth daily.   gabapentin 600 MG tablet Commonly known as: NEURONTIN Take 600 mg by mouth 3 (three) times daily.   HYDROcodone-Acetaminophen 7.5-300 MG Tabs Take by mouth.   ipratropium-albuterol 0.5-2.5 (3) MG/3ML Soln Commonly known as: DUONEB Take 3 mLs by nebulization 4 (four) times daily.   loratadine 10 MG tablet Commonly known as: CLARITIN Take 10 mg by mouth daily as needed for allergies.   LORazepam 0.5 MG tablet Commonly known as: ATIVAN Take 0.5 mg by mouth every 4 (four) hours as needed.   losartan 100 MG tablet Commonly known as: COZAAR Take 100 mg by mouth daily.   lovastatin 20 MG tablet Commonly known as: MEVACOR Take 1 tablet (20 mg total) by mouth at bedtime.   melatonin 5 MG Tabs Take 5 mg by mouth at bedtime.   nicotine 21 mg/24hr patch Commonly known as: NICODERM CQ - dosed in mg/24 hours Place 1 patch (21 mg total) onto the skin daily at 6 (six) AM.   nitroGLYCERIN 0.4 MG SL  tablet Commonly known as: Nitrostat Place 1 tablet (0.4 mg total) under the tongue every 5 (five) minutes as needed for chest pain.   pantoprazole 40 MG tablet Commonly known as: PROTONIX Take 1 tablet (40 mg  total) by mouth daily.   temazepam 7.5 MG capsule Commonly known as: RESTORIL Take 7.5 mg by mouth at bedtime as needed.   temazepam 15 MG capsule Commonly known as: RESTORIL Take 15 mg by mouth at bedtime.   triamcinolone cream 0.1 % Commonly known as: KENALOG Apply 1 application  topically daily as needed for rash.   VITAMIN D (CHOLECALCIFEROL) PO Take 2,000 Units by mouth daily.           Outstanding Labs/Studies   none  Duration of Discharge Encounter   Greater than 30 minutes including physician time.  Signed, Rosaria Ferries, PA-C 07/03/2022, 12:21 PM

## 2022-07-04 LAB — LIPOPROTEIN A (LPA): Lipoprotein (a): 13.5 nmol/L (ref <75.0)

## 2022-07-06 DIAGNOSIS — E871 Hypo-osmolality and hyponatremia: Secondary | ICD-10-CM | POA: Diagnosis not present

## 2022-07-06 DIAGNOSIS — I10 Essential (primary) hypertension: Secondary | ICD-10-CM | POA: Diagnosis not present

## 2022-07-06 DIAGNOSIS — E876 Hypokalemia: Secondary | ICD-10-CM | POA: Diagnosis not present

## 2022-07-06 DIAGNOSIS — R338 Other retention of urine: Secondary | ICD-10-CM | POA: Diagnosis not present

## 2022-07-06 DIAGNOSIS — J449 Chronic obstructive pulmonary disease, unspecified: Secondary | ICD-10-CM | POA: Diagnosis not present

## 2022-07-09 NOTE — Progress Notes (Signed)
Cardiology Clinic Note   Patient Name: Mark Stevenson Date of Encounter: 07/10/2022  Primary Care Provider:  Alroy Dust, L.Marlou Sa, MD Primary Cardiologist:  Ida Rogue, MD  Patient Profile    66 year old male with history of COPD, tobacco use, spinal stenosis, prior back surgery, alcohol abuse, hypertension, urinary retention, CAD, FTT who presents for hospital follow-up.  Past Medical History    Past Medical History:  Diagnosis Date   Arthritis    Cancer ([place])    skin cancer bil knees   COPD (chronic obstructive pulmonary disease) (HCC)    Dyspnea    increased exertion; pt states can walk a flight of stairs w/o having to stop to catch breath    Hypertension    Nicotine dependence, cigarettes, uncomplicated    Peripheral neuropathy    Pure hypercholesterolemia    Sebaceous cyst    scrotal area    Viral wart    Past Surgical History:  Procedure Laterality Date   ankles     tarsal tunnel surgery bilat   BACK SURGERY  2008 and 10/22/16   herniated disc   broken knee cap  2002   calf surgery Bilateral    len[place]hen the tendons   COLONOSCOPY     CORONARY LITHOTRIPSY N/A 07/02/2022   Procedure: CORONARY LITHOTRIPSY;  Surgeon: Wellington Hampshire, MD;  Location: [place] [facility];  Service: Cardiovascular;  Laterality: N/A;   CORONARY STENT INTERVENTION N/A 06/27/2022   Procedure: CORONARY STENT INTERVENTION;  Surgeon: Wellington Hampshire, MD;  Location: [place] [facility];  Service: Cardiovascular;  Laterality: N/A;   CORONARY STENT INTERVENTION N/A 07/02/2022   Procedure: CORONARY STENT INTERVENTION;  Surgeon: Wellington Hampshire, MD;  Location: [place] [facility];  Service: Cardiovascular;  Laterality: N/A;   crushed L-4 vertebrae  1980   KNEE ARTHROSCOPY     x3 bilat/ 2 times left knee   KYPHOPLASTY N/A 03/13/2021   Procedure: KYPHOPLASTY - T11 and L2;  Surgeon: Hessie Knows, MD;  Location: [facility];  Service: Orthopedics;  Laterality: N/A;   LEFT HEART CATH AND  CORONARY ANGIOGRAPHY Left 06/27/2022   Procedure: LEFT HEART CATH AND CORONARY ANGIOGRAPHY;  Surgeon: Wellington Hampshire, MD;  Location: [place] [facility];  Service: Cardiovascular;  Laterality: Left;   LEG SKIN LESION  BIOPSY / EXCISION     Bil knees   LUMBAR LAMINECTOMY/DECOMPRESSION MICRODISCECTOMY N/A 10/22/2016   Procedure: MICRO LUMBAR DECOMPRESSION L3-L4, L4-L5 REVISION  2 LEVELS;  Surgeon: Susa Day, MD;  Location: [facility];  Service: Orthopedics;  Laterality: N/A;  Requests 150 mins   PERIPHERAL VASCULAR CATHETERIZATION N/A 04/22/2016   Procedure: Abdominal Aortogram w/ bilateral Lower Extremity Runoff;  Surgeon: Serafina Mitchell, MD;  Location: [place] [facility];  Service: Cardiovascular;  Laterality: N/A;   PERIPHERAL VASCULAR CATHETERIZATION Left 04/22/2016   Procedure: Peripheral Vascular Atherectomy;  Surgeon: Serafina Mitchell, MD;  Location: [place] [facility];  Service: Cardiovascular;  Laterality: Left;   POLYPECTOMY     ruptured disc  2008   SHOULDER OPEN ROTATOR CUFF REPAIR Right 03/19/2017   Procedure: Mini open rotator cuff repair;  Surgeon: Susa Day, MD;  Location: [facility];  Service: Orthopedics;  Laterality: Right;  with block   SHOULDER SURGERY Right 2019   tendons in calves lengthened     plantar fascitis   TOTAL HIP ARTHROPLASTY  2012   left    Allergies  No Known Allergies  History of Present Illness  Mark Stevenson is a 66 year old male with a past medical history of COPD, hypertension, alcohol abuse, urinary retention with abnormal cardiac CTA.    Echocardiogram 8/ 2023 showed normal LV function, mild left ventricular hypertrophy, and G1 DD.  He had a coronary CTA for shortness of breath 2023 which showed coronary calcium score 1928, 95th percentile for age and sex matched control, calcified plaque in the proximal RCA open severe stenosis, calcified plaque in the proximal to mid LAD, with moderate stenosis 50 to 69%.  He presented to the  emergency department on July 10 auditory loose [place] but left before he was seen.  He was back on July 11 with shortness of breath and hallucinations, treated with DuoNebs and steroids.  Labs showed sodium 121 and WBCs of 14,000.  He was given hypertonic saline and placed on BiPAP.  He was middle status change was due to hyponatremia and hypercapnia.  Due to his COPD with poor functional capacity hospice was discussed.  Seen by cardiology was not felt to be a good Candidate.  He was sent home with hospice.  He had cardiac catheterization completed on 9/26 which showed proximal RCA lesion 60% stenosis, proximal RCA to mid RCA lesion 90% stenosed, mid RCA lesion 99% stenosis, acute marginal lesion with 90% stenosis.  Very difficult but successful PCI with intravascular lithotripsy with 2 overlapping DES placements in the mid and proximal RCA.  Balloon angioplasty had to be done first on the bifurcating RV marginal in order to facilitate crossing with a wire into the distal RCA.  He returns today for follow-up  Home Medications    Current Outpatient Medications  Medication Sig Dispense Refill   albuterol (PROVENTIL) (2.5 MG/3ML) 0.083% nebulizer solution Take 3 mLs (2.5 mg total) by nebulization every 4 (four) hours as needed for wheezing or shortness of breath. 75 mL 12   albuterol (VENTOLIN HFA) 108 (90 Base) MCG/ACT inhaler Inhale 2 puffs into the lungs every 4 (four) hours as needed.     aspirin EC 81 MG tablet Take 1 tablet (81 mg total) by mouth daily. Swallow whole. 90 tablet 3   budesonide (PULMICORT) 0.25 MG/2ML nebulizer solution Take 2 mLs (0.25 mg total) by nebulization daily. 60 mL 12   clopidogrel (PLAVIX) 75 MG tablet Take 1 tablet (75 mg total) by mouth daily. 30 tablet 6   gabapentin (NEURONTIN) 600 MG tablet Take 600 mg by mouth 3 (three) times daily.     HYDROcodone-Acetaminophen 7.5-300 MG TABS Take by mouth.     ipratropium-albuterol (DUONEB) 0.5-2.5 (3) MG/3ML SOLN Take 3 mLs by  nebulization 4 (four) times daily. 360 mL 11   loratadine (CLARITIN) 10 MG tablet Take 10 mg by mouth daily as needed for allergies.     LORazepam (ATIVAN) 0.5 MG tablet Take 0.5 mg by mouth every 4 (four) hours as needed.     losartan (COZAAR) 100 MG tablet Take 100 mg by mouth daily.     lovastatin (MEVACOR) 20 MG tablet Take 1 tablet (20 mg total) by mouth at bedtime.     melatonin 5 MG TABS Take 5 mg by mouth at bedtime.     nicotine (NICODERM CQ - DOSED IN MG/24 HOURS) 21 mg/24hr patch Place 1 patch (21 mg total) onto the skin daily at 6 (six) AM. 28 patch 0   nitroGLYCERIN (NITROSTAT) 0.4 MG SL tablet Place 1 tablet (0.4 mg total) under the tongue every 5 (five) minutes as needed for chest pain. 25 tablet 3  pantoprazole (PROTONIX) 40 MG tablet Take 1 tablet (40 mg total) by mouth daily. 30 tablet 0   temazepam (RESTORIL) 15 MG capsule Take 15 mg by mouth at bedtime.     temazepam (RESTORIL) 7.5 MG capsule Take 7.5 mg by mouth at bedtime as needed.     triamcinolone cream (KENALOG) 0.1 % Apply 1 application  topically daily as needed for rash. (Patient not taking: Reported on 06/27/2022)     VITAMIN D, CHOLECALCIFEROL, PO Take 2,000 Units by mouth daily.     No current facility-administered medications for this visit.     Family History    Family History  Problem Relation Age of Onset   Lung cancer Father    Colon cancer Son    Liver cancer Son    Pancreatic cancer Neg Hx    Stomach cancer Neg Hx    Esophageal cancer Neg Hx    Rectal cancer Neg Hx    He indicated that his mother is deceased. He indicated that his father is deceased. He indicated that his sister is alive. He indicated that one of his two sons is deceased. He indicated that the status of his neg hx is unknown.  Social History    Social History   Socioeconomic History   Marital status: Divorced    Spouse name: Not on file   Number of children: Not on file   Years of education: Not on file   Highest  education level: Not on file  Occupational History   Not on file  Tobacco Use   Smoking status: Former    Packs/day: 1.50    Years: 42.00    Total pack years: 63.00    Types: Cigarettes    Quit date: 03/07/2022    Years since quitting: 0.3   Smokeless tobacco: Never   Tobacco comments:    Vaping during the course of the day (1-2 puffs)  every couple of hours.  06/05/2022  Vaping Use   Vaping Use: Every day  Substance and Sexual Activity   Alcohol use: Not Currently    Alcohol/week: 10.0 standard drinks of alcohol    Types: 10 Shots of liquor per week    Comment: 3 days per week vodka    Drug use: No   Sexual activity: Not on file  Other Topics Concern   Not on file  Social History Narrative   Lives alone and reports he has 24 hours/day home health care that are live-in.   Social Determinants of Health   Financial Resource Strain: Not on file  Food Insecurity: Not on file  Transportation Needs: Not on file  Physical Activity: Not on file  Stress: Not on file  Social Connections: Not on file  Intimate Partner Violence: Not on file     Review of Systems    General:  No chills, fever, night sweats or weight changes.  Cardiovascular:  No chest pain, dyspnea on exertion, edema, orthopnea, palpitations, paroxysmal nocturnal dyspnea. Dermatological: No rash, lesions/masses Respiratory: No cough, dyspnea Urologic: No hematuria, dysuria Abdominal:   No nausea, vomiting, diarrhea, bright red blood per rectum, melena, or hematemesis Neurologic:  No visual changes, wkns, changes in mental status. All other systems reviewed and are otherwise negative except as noted above.     Physical Exam    VS:  BP 126/84 (BP Location: Left Arm, Patient Position: Sitting, Cuff Size: Normal)   Pulse 84   Ht 5' 11.5" (1.816 m)   Wt 150 lb 6.4 oz (  68.2 kg)   SpO2 100%   PF (!) 2 L/min   BMI 20.68 kg/m  , BMI Body mass index is 20.68 kg/m.     GEN: Well nourished, well developed, in no  acute distress. HEENT: normal. Neck: Supple, no JVD, carotid bruits, or masses. Cardiac: RRR, no murmurs, rubs, or gallops. No clubbing, cyanosis, edema.  Radials/DP/PT 2+ and equal bilaterally.  Respiratory:  Respirations regular and unlabored at rest on 2L O2 via [place] to auscultation bilaterally. Rhonchi throughout with congested cough that is nonproductive. GI: Soft, nontender, nondistended, BS + x 4. MS: no deformity or atrophy. Skin: warm and dry, no rash. Neuro:  Strength and sensation are intact. Psych: Normal affect.  Accessory Clinical Findings    ECG personally reviewed by me today-sinus rhythm with a rate of 84, left axis deviation- No acute changes  Lab Results  Component Value Date   WBC 7.8 07/03/2022   HGB 9.8 (L) 07/03/2022   HCT 30.5 (L) 07/03/2022   MCV 97.1 07/03/2022   PLT 157 07/03/2022   Lab Results  Component Value Date   CREATININE 0.63 07/03/2022   BUN 21 07/03/2022   NA 140 07/03/2022   K 4.6 07/03/2022   CL 103 07/03/2022   CO2 29 07/03/2022   Lab Results  Component Value Date   ALT 15 04/07/2022   AST 25 04/07/2022   ALKPHOS 68 04/07/2022   BILITOT 1.0 04/07/2022   No results found for: "CHOL", "HDL", "LDLCALC", "LDLDIRECT", "TRIG", "CHOLHDL"  No results found for: "HGBA1C"  Assessment & Plan   1.  Coronary artery disease status post left heart catheterization that revealed proximal RCA lesion that was 60% stenosed, proximal RCA to mid RCA lesion is 90% stenosed, mid RCA lesion 99% stenosis, acute marginal lesion 90% stenosed, proximal LAD to mid LAD lesion with 30% stenosis and mid LAD lesion is 40% stenosis.  Severe one-vessel coronary artery disease involving the right coronary artery which was 99% stenosis and heavily calcified in the long segment at the bifurcation of the large RV marginal which was also diseased at the ostium with moderate LAD disease.  Left ventricular angiography was not performed EF was normal by echocardiogram and he did  have mildly elevated LVEDP.  They attempted unsuccessful angioplasty of the right coronary artery due to inability to cross the stenosis into the distal RCA.  He [facility] on 07/[facility]/2022 had a very difficult but successful PCI with intravascular lithotripsy and 2 overlapping DES placement to the mid and proximal RCA.  Balloon angioplasty had to be done first on the bifurcating RV marginal in order to facilitate crossing with a wire into the distal RCA.  The recommendations were dual antiplatelet therapy for minimum of 6 months and continue with aggressive treatment of risk factors as he is likely not a candidate for CABG due to his COPD.  Currently he remains chest pain-free today and has not had to use any of his sublingual Nitrostat he was prescribed on discharge.  He has also continued back with palliative/hospice care.  He is continued on aspirin 81 mg daily, clopidogrel 75 mg daily, and Nitrostat 0.4 mg sublingual as needed, he has been considered on lovastatin 20 mg daily, but unfortunately due to his severe respiratory status he is not on any beta-blocker therapy.  Patient did have cardiac rehab that was ordered on discharge from the hospital unfortunately with him returning to hospice care and with his pulmonary status he  he is currently not a candidate to undergo already agree have.  He did have a noted drop in his hemoglobin status postprocedure and he has been sent for a follow-up CBC today.  2.  COPD on chronic 2 L of O2 via nasal cannula with continued breathing issues.  He continues to follow with pulmonary.  He continues to vape.  Has been continued on all of his inhalers as well.  3.  Hypertension with a blood pressure today of 126/84.  Has been stable.  He has been continued on his losartan 100 mg daily.  He requires no refills and there were no changes made to his medication regimen today.  4.  Nicotine dependence with total cessation is recommended.  5.  FTT as  he has returned to hospice care.  6.  Disposition patient return to clinic to see MD/APP in 6 weeks or sooner if needed.   Cariana Karge, NP 07/10/2022, 1:04 PM

## 2022-07-10 ENCOUNTER — Ambulatory Visit: Payer: Medicare Other | Attending: Cardiology | Admitting: Cardiology

## 2022-07-10 ENCOUNTER — Encounter: Payer: Self-pay | Admitting: Cardiology

## 2022-07-10 ENCOUNTER — Telehealth: Payer: Self-pay | Admitting: Cardiovascular Disease

## 2022-07-10 VITALS — BP 126/84 | HR 84 | Ht 71.5 in | Wt 150.4 lb

## 2022-07-10 DIAGNOSIS — F17298 Nicotine dependence, other tobacco product, with other nicotine-induced disorders: Secondary | ICD-10-CM

## 2022-07-10 DIAGNOSIS — J449 Chronic obstructive pulmonary disease, unspecified: Secondary | ICD-10-CM | POA: Diagnosis not present

## 2022-07-10 DIAGNOSIS — I25118 Atherosclerotic heart disease of native coronary artery with other forms of angina pectoris: Secondary | ICD-10-CM

## 2022-07-10 DIAGNOSIS — R627 Adult failure to thrive: Secondary | ICD-10-CM | POA: Diagnosis not present

## 2022-07-10 DIAGNOSIS — I10 Essential (primary) hypertension: Secondary | ICD-10-CM

## 2022-07-10 NOTE — Telephone Encounter (Signed)
Spoke with Rusty with [facility]. He requested that we please fax order to their office and they will get that done.  Order received and faxed to them requesting results be faxed back to [place].

## 2022-07-10 NOTE — Patient Instructions (Addendum)
Medication Instructions:  No changes at this time.   *If you need a refill on your cardiac medications before your next appointment, please call your pharmacy*   Lab Work: CBC needed   If you have labs (blood work) drawn today and your tests are completely normal, you will receive your results only by: [place] (if you have MyChart) OR A paper copy in the mail If you have any lab test that is abnormal or we need to change your treatment, we will call you to review the results.   Testing/Procedures: None   Follow-Up: At [facility], you and your health needs are our priority.  As part of our continuing mission to provide you with exceptional heart care, we have created designated Provider Care Teams.  These Care Teams include your primary Cardiologist (physician) and Advanced Practice Providers (APPs -  Physician Assistants and Nurse Practitioners) who all work together to provide you with the care you need, when you need it.   Your next appointment:   2 month(s)  The format for your next appointment:   In Person  Provider:   Ida Rogue, MD or Gerrie Nordmann, NP         Important Information About [place]

## 2022-07-10 NOTE — Telephone Encounter (Signed)
Caller stated the patient would like to have his CBC lab drawn home and dropped off to [place] requests the order be faxed to fax#  [phone].

## 2022-07-14 DIAGNOSIS — R531 Weakness: Secondary | ICD-10-CM | POA: Diagnosis not present

## 2022-07-14 DIAGNOSIS — G629 Polyneuropathy, unspecified: Secondary | ICD-10-CM | POA: Diagnosis not present

## 2022-07-14 DIAGNOSIS — I739 Peripheral vascular disease, unspecified: Secondary | ICD-10-CM | POA: Diagnosis not present

## 2022-07-14 DIAGNOSIS — J449 Chronic obstructive pulmonary disease, unspecified: Secondary | ICD-10-CM | POA: Diagnosis not present

## 2022-07-14 DIAGNOSIS — I10 Essential (primary) hypertension: Secondary | ICD-10-CM | POA: Diagnosis not present

## 2022-07-14 DIAGNOSIS — R339 Retention of urine, unspecified: Secondary | ICD-10-CM | POA: Diagnosis not present

## 2022-07-15 ENCOUNTER — Telehealth: Payer: Self-pay | Admitting: Cardiovascular Disease

## 2022-07-15 DIAGNOSIS — I10 Essential (primary) hypertension: Secondary | ICD-10-CM

## 2022-07-15 DIAGNOSIS — I25118 Atherosclerotic heart disease of native coronary artery with other forms of angina pectoris: Secondary | ICD-10-CM

## 2022-07-15 NOTE — Telephone Encounter (Signed)
Caller stated she tried to draw blood for the patient's CBC test but when she stuck him he bled like an artery.  Caller stated the patient had been taking Aleve and the medication has now been removed from his reach.  Caller she did not want to re-stick him today and that the patient will go to the [facility] to be re-drawn.

## 2022-07-15 NOTE — Telephone Encounter (Signed)
Home health nurse called stating that she went to get labs and he bleed so bad she was not able to get the labs. She instructed patient to go to [facility] entrance to have those done. Order has been updated and available for when he comes in. No further needs.

## 2022-07-18 ENCOUNTER — Other Ambulatory Visit
Admission: RE | Admit: 2022-07-18 | Discharge: 2022-07-18 | Disposition: A | Discharge status: Home / Self Care | Payer: Medicare Other | Attending: Cardiology | Admitting: Cardiology

## 2022-07-18 DIAGNOSIS — I10 Essential (primary) hypertension: Secondary | ICD-10-CM | POA: Insufficient documentation

## 2022-07-18 DIAGNOSIS — I25118 Atherosclerotic heart disease of native coronary artery with other forms of angina pectoris: Secondary | ICD-10-CM | POA: Insufficient documentation

## 2022-07-18 LAB — CBC
HCT: 37.5 % — ABNORMAL LOW (ref 39.0–52.0)
Hemoglobin: 11.1 g/dL — ABNORMAL LOW (ref 13.0–17.0)
MCH: 29.7 pg (ref 26.0–34.0)
MCHC: 29.6 g/dL — ABNORMAL LOW (ref 30.0–36.0)
MCV: 100.3 fL — ABNORMAL HIGH (ref 80.0–100.0)
Platelets: 183 10*3/uL (ref 150–400)
RBC: 3.74 MIL/uL — ABNORMAL LOW (ref 4.22–5.81)
RDW: 13.3 % (ref 11.5–15.5)
WBC: 6 10*3/uL (ref 4.0–10.5)
nRBC: 0 % (ref 0.0–0.2)

## 2022-08-29 DEATH — deceased

## 2022-09-09 ENCOUNTER — Ambulatory Visit: Payer: Medicare Other | Admitting: Cardiology

## 2022-12-23 ENCOUNTER — Ambulatory Visit: Payer: Medicare Other | Admitting: Cardiovascular Disease

## 2023-03-25 IMAGING — CR DG CHEST 2V
2 series · 2 of 2 positions shown · non-contrast
Comparison: Chest x-ray dated August 27, 2017.

CLINICAL DATA: Back pain after recent fall.

EXAM:
CHEST - 2 VIEW

[chest lat]
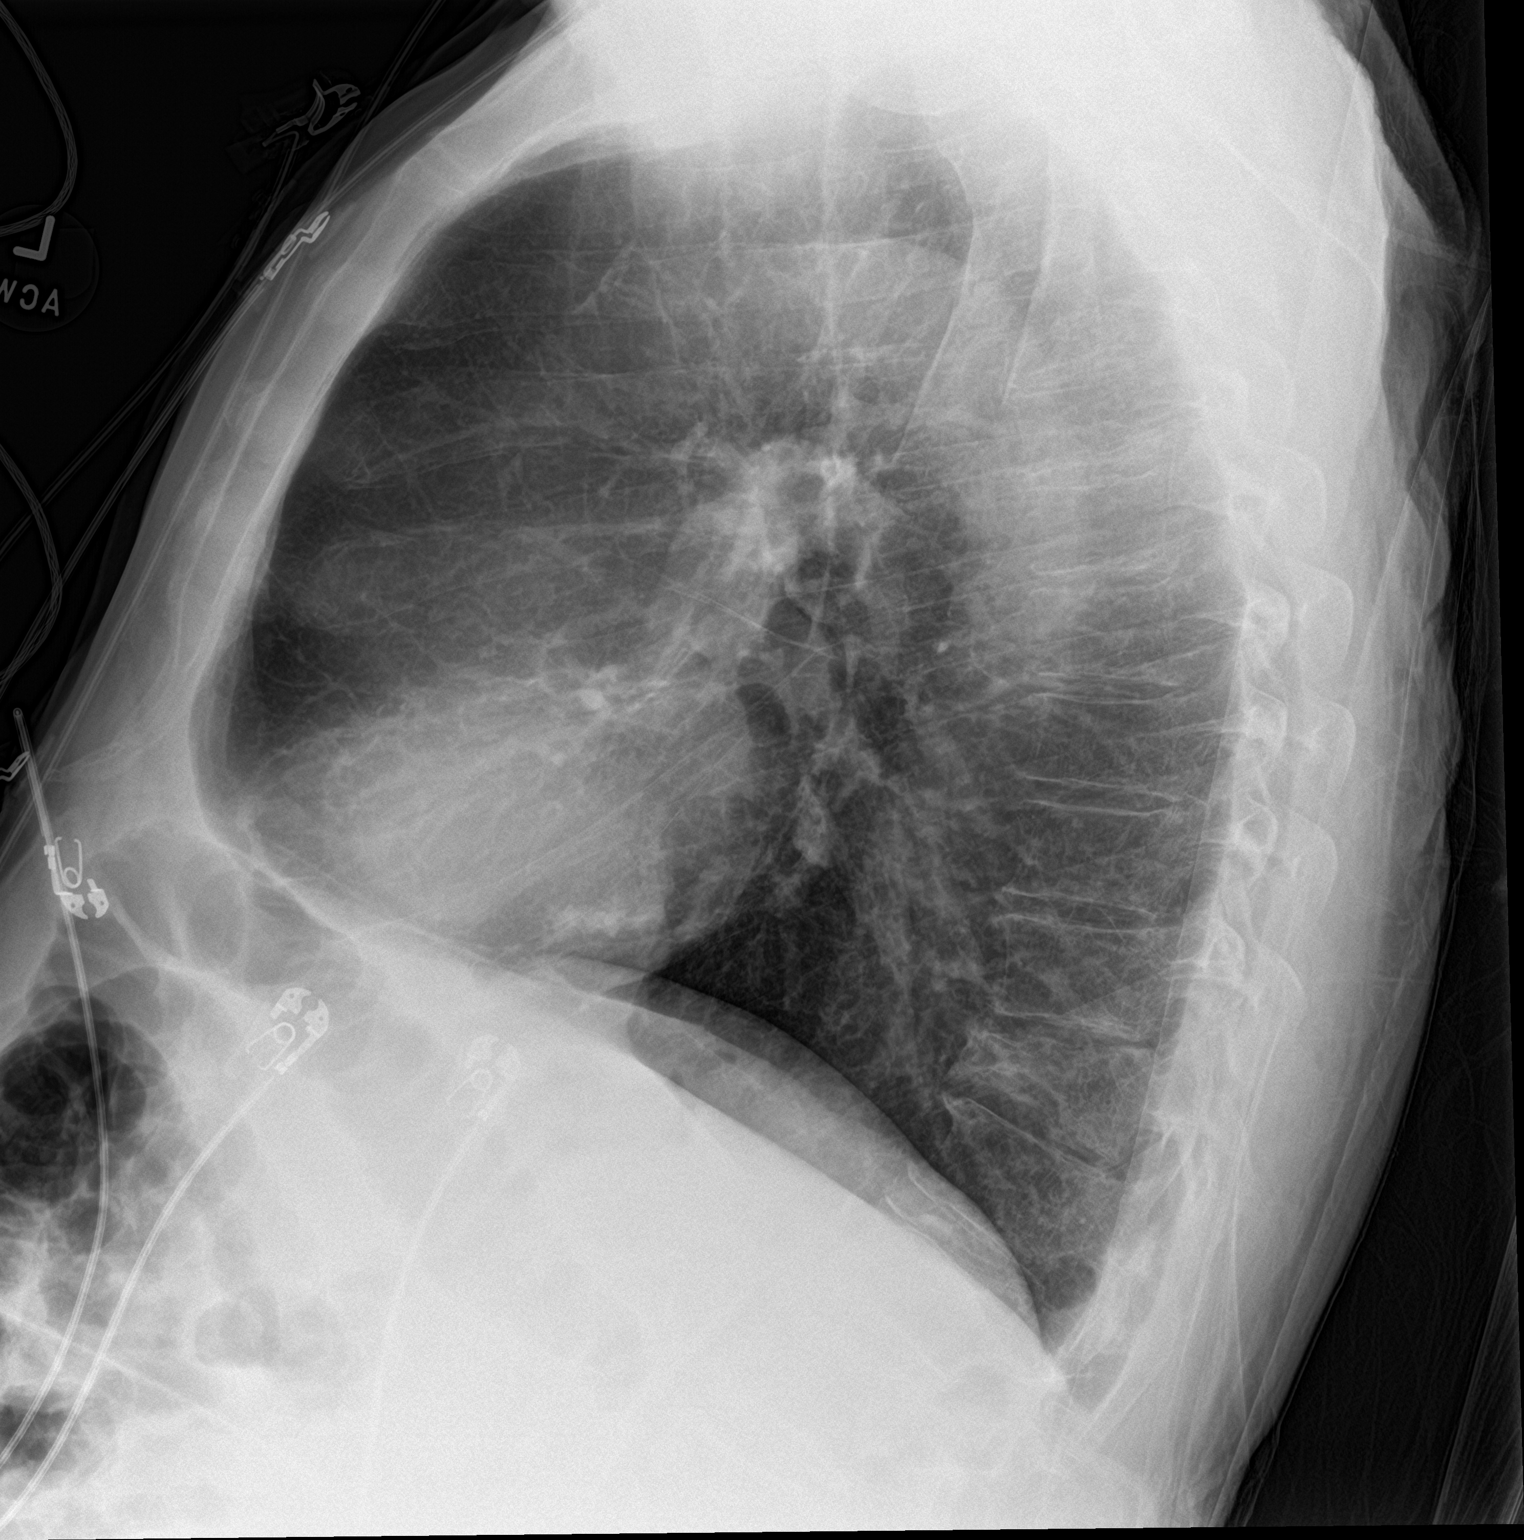

[chest ap]
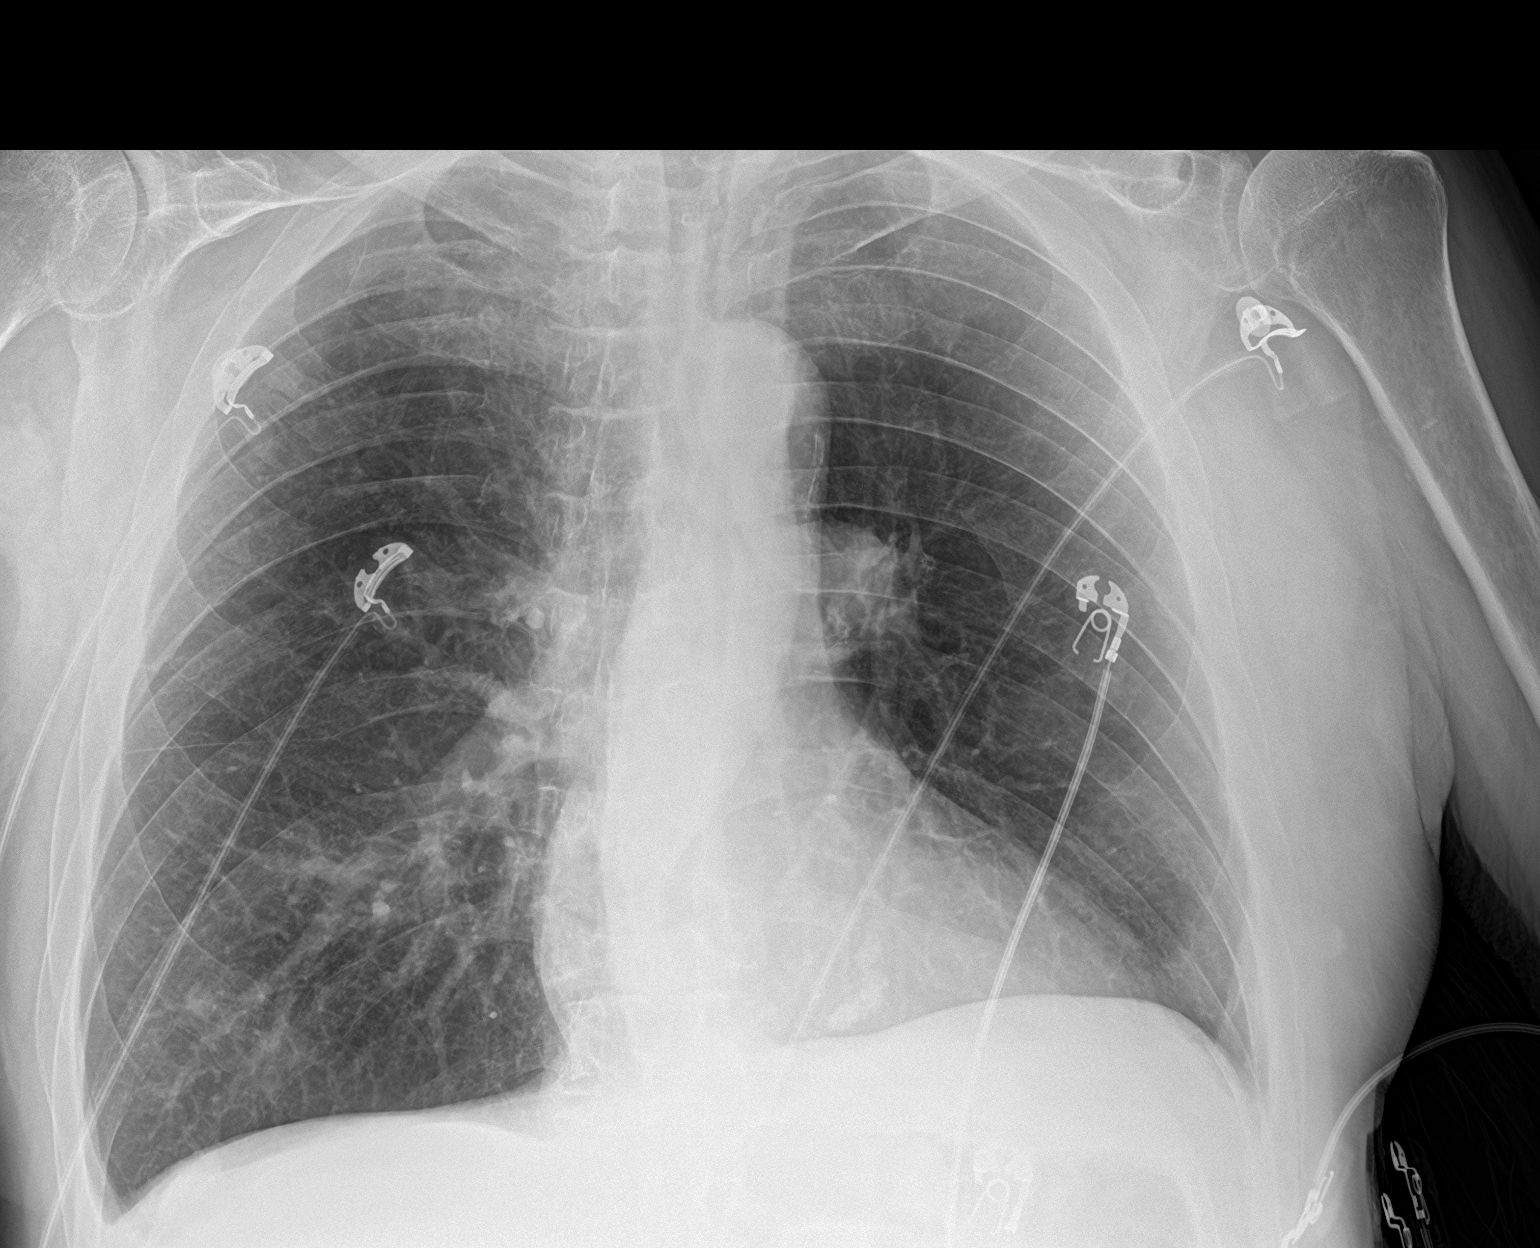

[2 of 2 positions shown; findings below may reference images not displayed]

FINDINGS: The heart size and mediastinal contours are within normal limits.
Normal pulmonary vascularity. No focal consolidation, pleural
effusion, or pneumothorax. Bilateral nipple shadows again noted.
Acute on chronic T11 compression fracture as described on concurrent
thoracic spine x-rays from same day.
IMPRESSION: 1. No acute cardiopulmonary disease.
2. Acute on chronic T11 compression fracture as described on
concurrent thoracic spine x-rays from same day.

## 2023-03-27 IMAGING — RF DG LUMBAR SPINE 2-3V
1 series · 2 of 2 positions shown · non-contrast
Comparison: 03/12/2021 MRI

CLINICAL DATA: T11, L2 kyphoplasty

EXAM:
LUMBAR SPINE - 2-3 VIEW; DG C-ARM 1-60 MIN

[Series 1: dg x-ray · 0.14mm/px · 2 of 2 slices shown]
[im 1/2]
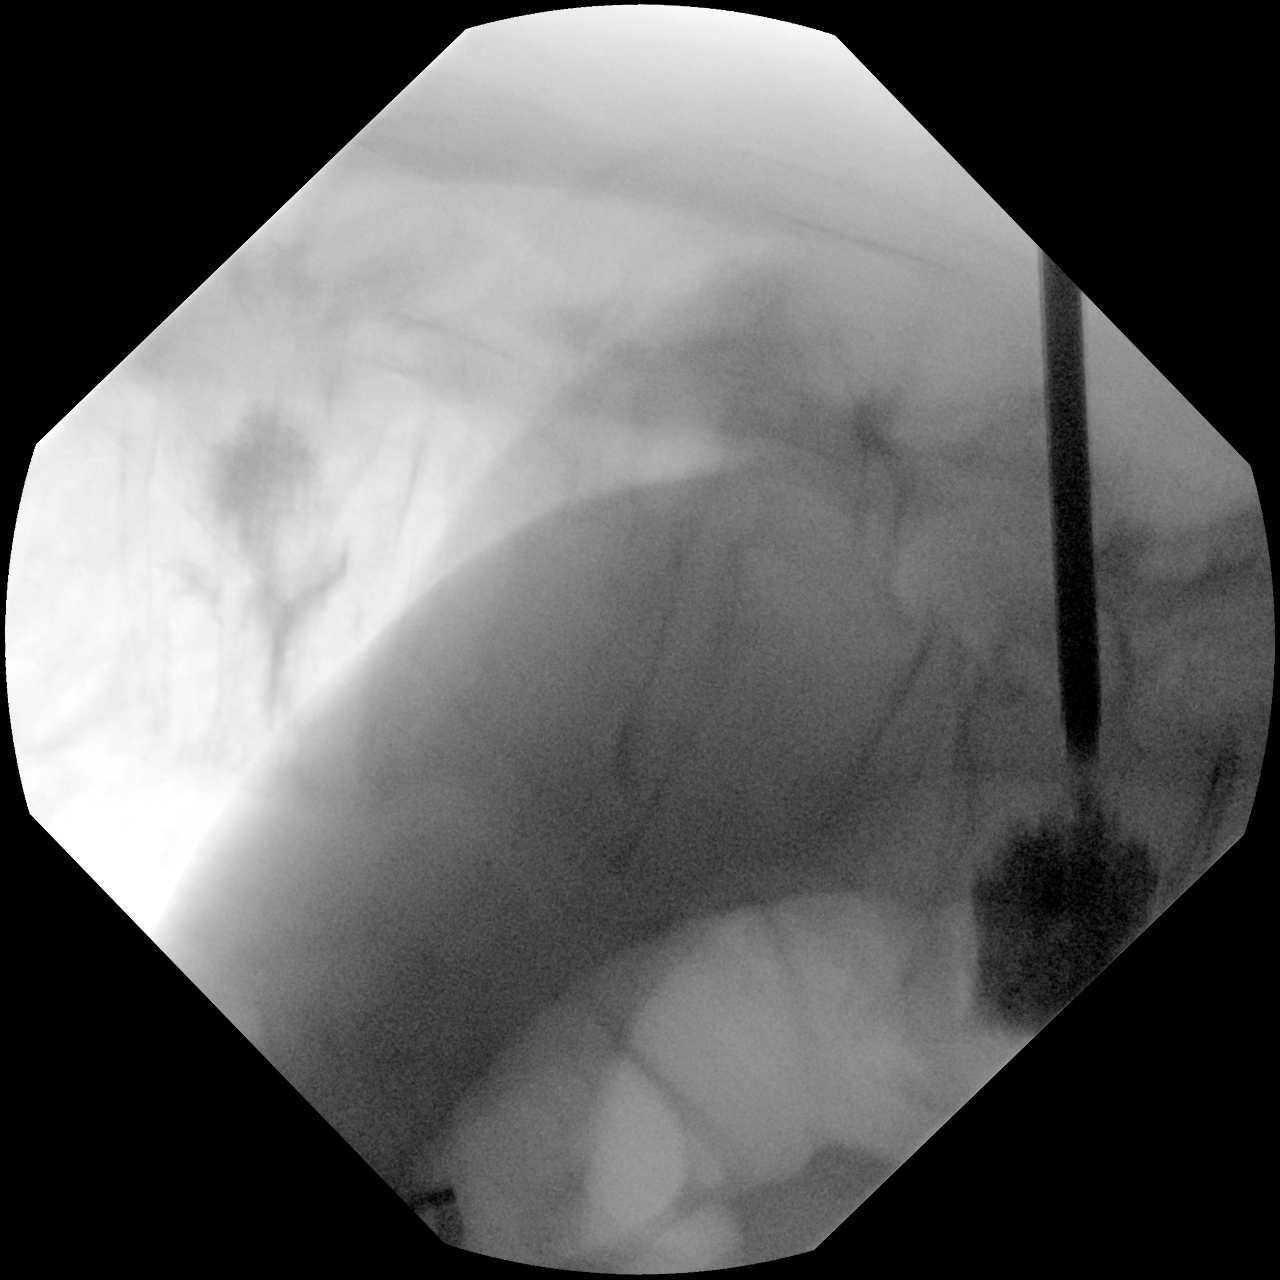
[im 2/2]
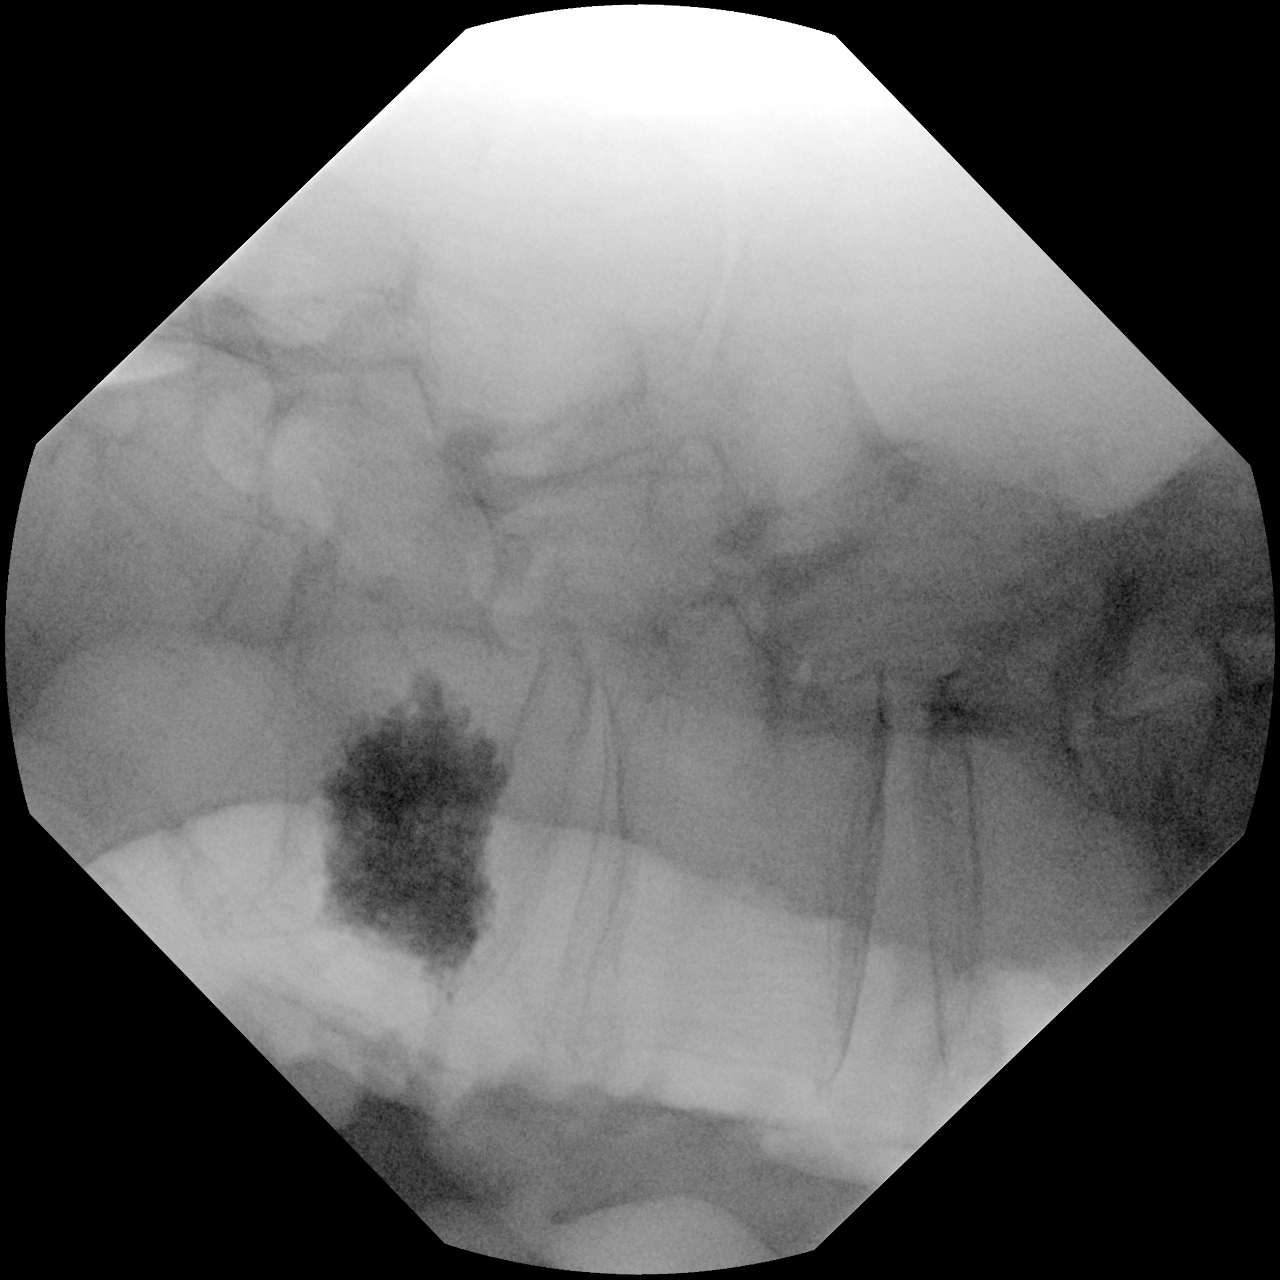

[2 of 2 positions shown; findings below may reference images not displayed]

FLUOROSCOPY TIME:  Fluoroscopy Time:  2 minutes 6 seconds

Radiation Exposure Index (if provided by the fluoroscopic device):
47.9 mGy

Number of Acquired Spot Images: 4
FINDINGS: Contrast laden cement is noted at T11 and L1 consistent with the
recent history of kyphoplasty.
IMPRESSION: T11 and L1 kyphoplasty.

## 2023-03-27 IMAGING — RF DG C-ARM 1-60 MIN
1 series · 2 of 2 positions shown · non-contrast
Comparison: 03/12/2021 MRI

CLINICAL DATA: T11, L2 kyphoplasty

EXAM:
LUMBAR SPINE - 2-3 VIEW; DG C-ARM 1-60 MIN

[Series 1: dg x-ray · 0.14mm/px · 2 of 2 slices shown]
[im 1/2]
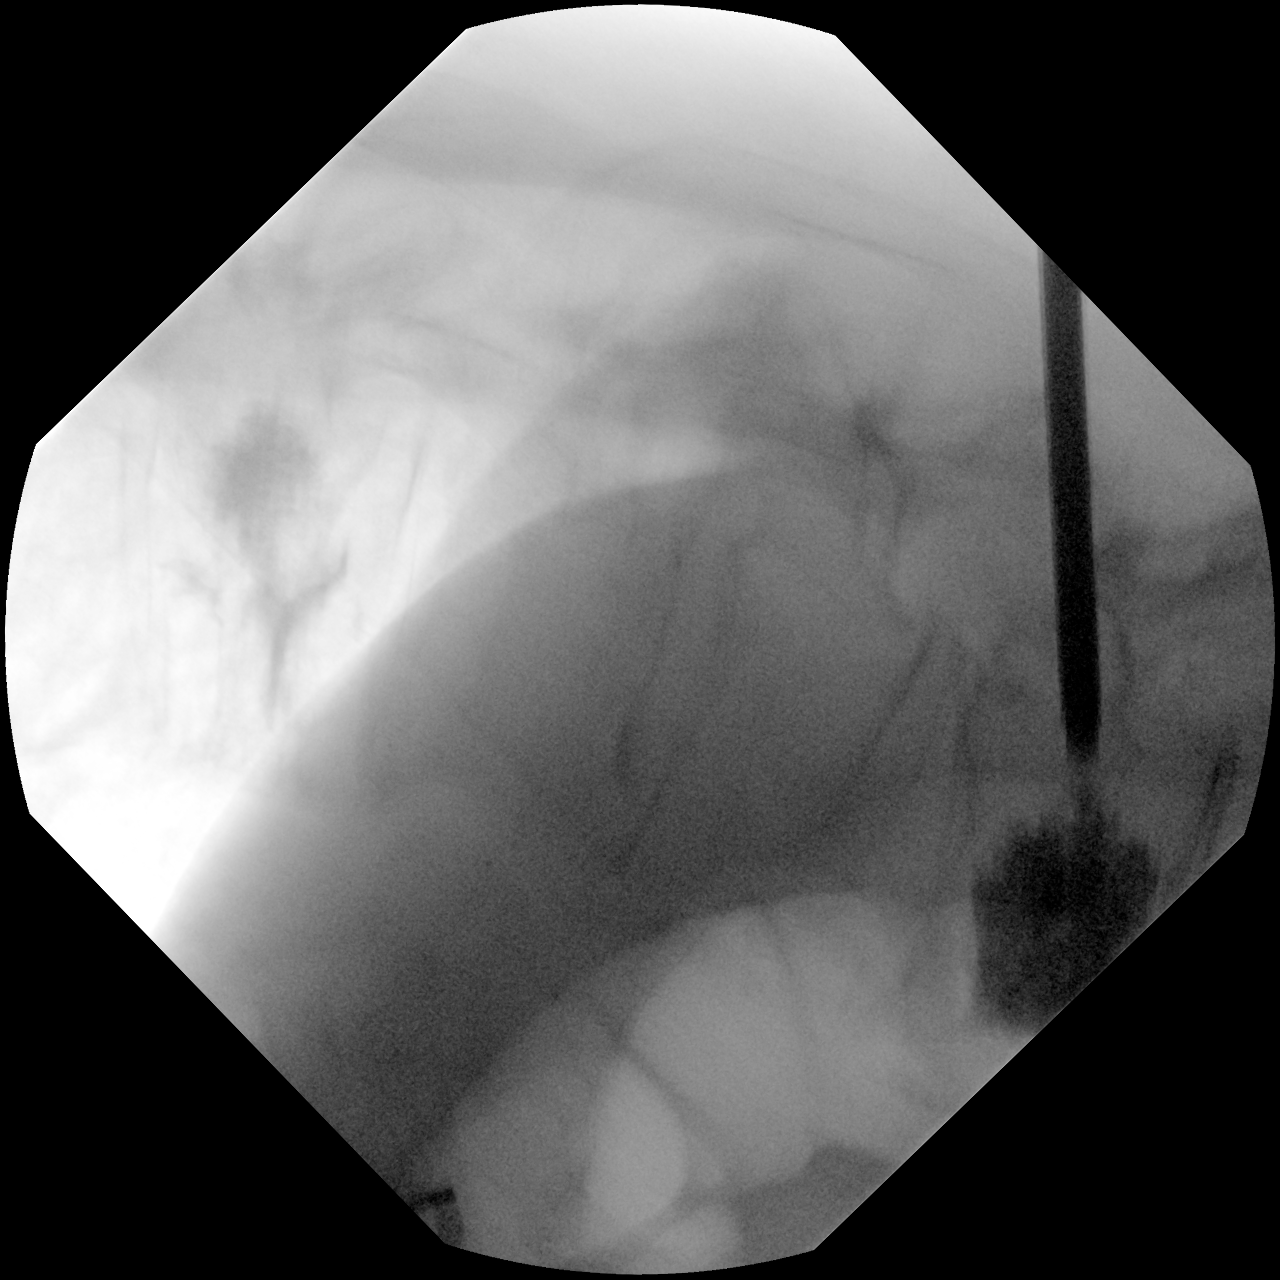
[im 2/2]
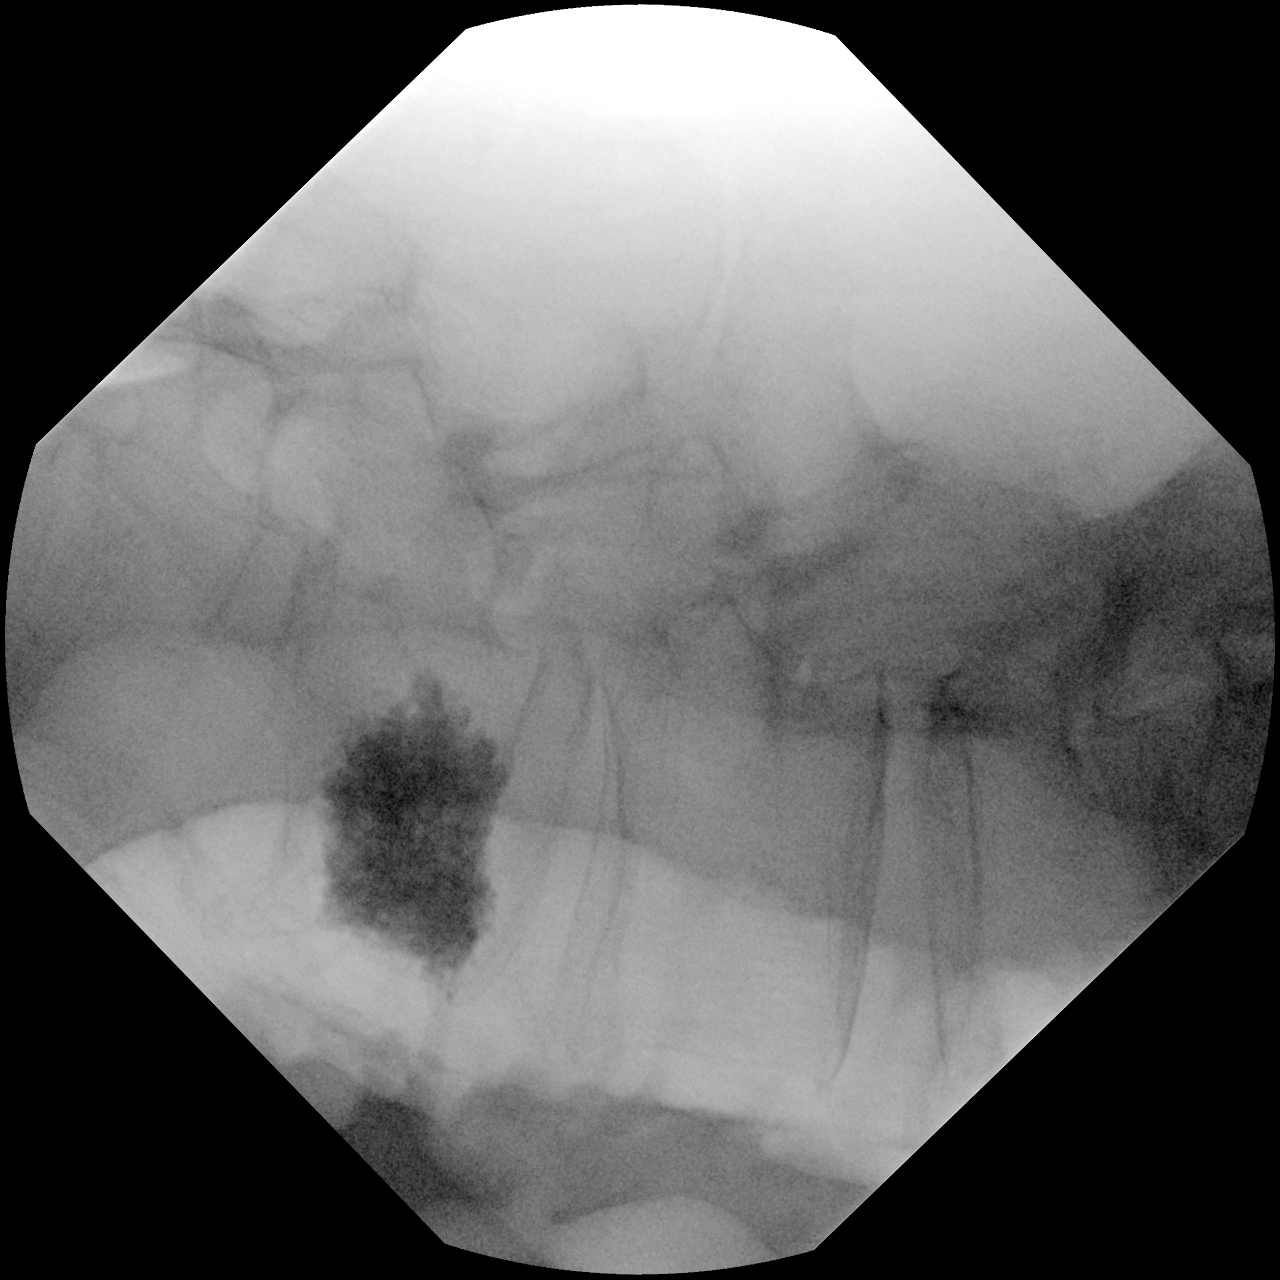

[2 of 2 positions shown; findings below may reference images not displayed]

FLUOROSCOPY TIME:  Fluoroscopy Time:  2 minutes 6 seconds

Radiation Exposure Index (if provided by the fluoroscopic device):
47.9 mGy

Number of Acquired Spot Images: 4
FINDINGS: Contrast laden cement is noted at T11 and L1 consistent with the
recent history of kyphoplasty.
IMPRESSION: T11 and L1 kyphoplasty.
